# Patient Record
Sex: Male | Born: 1970
Health system: Southern US, Community
[De-identification: ages and names within clinical notes are randomized; demographics above are authoritative.]

## PROBLEM LIST (undated history)

## (undated) DIAGNOSIS — E785 Hyperlipidemia, unspecified: Secondary | ICD-10-CM

## (undated) DIAGNOSIS — K279 Peptic ulcer, site unspecified, unspecified as acute or chronic, without hemorrhage or perforation: Secondary | ICD-10-CM

## (undated) DIAGNOSIS — I10 Essential (primary) hypertension: Secondary | ICD-10-CM

## (undated) DIAGNOSIS — A630 Anogenital (venereal) warts: Secondary | ICD-10-CM

## (undated) HISTORY — DX: Anogenital (venereal) warts: A63.0

## (undated) HISTORY — DX: Essential (primary) hypertension: I10

## (undated) HISTORY — DX: Peptic ulcer, site unspecified, unspecified as acute or chronic, without hemorrhage or perforation: K27.9

## (undated) HISTORY — DX: Hyperlipidemia, unspecified: E78.5

---

## 2003-11-19 ENCOUNTER — Emergency Department (HOSPITAL_COMMUNITY): Admission: EM | Admit: 2003-11-19 | Discharge: 2003-11-19 | Payer: Self-pay | Admitting: Emergency Medicine

## 2003-11-22 ENCOUNTER — Emergency Department (HOSPITAL_COMMUNITY): Admission: EM | Admit: 2003-11-22 | Discharge: 2003-11-22 | Payer: Self-pay | Admitting: Emergency Medicine

## 2004-07-07 ENCOUNTER — Ambulatory Visit: Payer: Self-pay | Admitting: Internal Medicine

## 2004-07-15 ENCOUNTER — Encounter: Admission: RE | Admit: 2004-07-15 | Discharge: 2004-07-15 | Payer: Self-pay | Admitting: Internal Medicine

## 2004-12-25 ENCOUNTER — Emergency Department (HOSPITAL_COMMUNITY): Admission: EM | Admit: 2004-12-25 | Discharge: 2004-12-25 | Payer: Self-pay | Admitting: Emergency Medicine

## 2005-05-21 ENCOUNTER — Emergency Department (HOSPITAL_COMMUNITY): Admission: EM | Admit: 2005-05-21 | Discharge: 2005-05-21 | Payer: Self-pay | Admitting: Emergency Medicine

## 2005-11-22 ENCOUNTER — Ambulatory Visit: Payer: Self-pay | Admitting: Internal Medicine

## 2006-08-11 ENCOUNTER — Ambulatory Visit: Payer: Self-pay | Admitting: Internal Medicine

## 2007-08-15 ENCOUNTER — Ambulatory Visit: Payer: Self-pay | Admitting: Internal Medicine

## 2007-08-15 DIAGNOSIS — Z8711 Personal history of peptic ulcer disease: Secondary | ICD-10-CM

## 2007-08-17 LAB — CONVERTED CEMR LAB
AST: 76 units/L — ABNORMAL HIGH (ref 0–37)
Albumin: 4.1 g/dL (ref 3.5–5.2)
Alkaline Phosphatase: 46 units/L (ref 39–117)
BUN: 7 mg/dL (ref 6–23)
Basophils Absolute: 0.1 10*3/uL (ref 0.0–0.1)
Calcium: 9.6 mg/dL (ref 8.4–10.5)
Chloride: 109 meq/L (ref 96–112)
Cholesterol: 226 mg/dL (ref 0–200)
Creatinine, Ser: 1 mg/dL (ref 0.4–1.5)
MCHC: 32 g/dL (ref 30.0–36.0)
Monocytes Relative: 6.4 % (ref 3.0–11.0)
Potassium: 4.2 meq/L (ref 3.5–5.1)
RBC: 5.33 M/uL (ref 4.22–5.81)
RDW: 12.3 % (ref 11.5–14.6)
TSH: 2.33 microintl units/mL (ref 0.35–5.50)
Total Bilirubin: 1.1 mg/dL (ref 0.3–1.2)
Total CHOL/HDL Ratio: 3.3
Triglycerides: 65 mg/dL (ref 0–149)

## 2007-08-22 ENCOUNTER — Telehealth (INDEPENDENT_AMBULATORY_CARE_PROVIDER_SITE_OTHER): Payer: Self-pay | Admitting: *Deleted

## 2007-09-17 ENCOUNTER — Encounter: Payer: Self-pay | Admitting: Internal Medicine

## 2007-09-17 ENCOUNTER — Encounter: Admission: RE | Admit: 2007-09-17 | Discharge: 2007-09-17 | Payer: Self-pay | Admitting: Internal Medicine

## 2007-10-11 ENCOUNTER — Telehealth (INDEPENDENT_AMBULATORY_CARE_PROVIDER_SITE_OTHER): Payer: Self-pay | Admitting: *Deleted

## 2007-10-15 ENCOUNTER — Ambulatory Visit: Payer: Self-pay | Admitting: Internal Medicine

## 2007-10-23 ENCOUNTER — Telehealth (INDEPENDENT_AMBULATORY_CARE_PROVIDER_SITE_OTHER): Payer: Self-pay | Admitting: *Deleted

## 2008-04-03 ENCOUNTER — Telehealth: Payer: Self-pay | Admitting: Internal Medicine

## 2008-05-28 ENCOUNTER — Telehealth (INDEPENDENT_AMBULATORY_CARE_PROVIDER_SITE_OTHER): Payer: Self-pay | Admitting: *Deleted

## 2008-06-04 ENCOUNTER — Encounter (INDEPENDENT_AMBULATORY_CARE_PROVIDER_SITE_OTHER): Payer: Self-pay | Admitting: *Deleted

## 2008-06-06 HISTORY — PX: VASECTOMY: SHX75

## 2008-11-24 ENCOUNTER — Encounter: Payer: Self-pay | Admitting: Internal Medicine

## 2009-06-23 ENCOUNTER — Ambulatory Visit: Payer: Self-pay | Admitting: Internal Medicine

## 2009-06-23 DIAGNOSIS — R6882 Decreased libido: Secondary | ICD-10-CM | POA: Insufficient documentation

## 2009-07-01 LAB — CONVERTED CEMR LAB
Basophils Absolute: 0 10*3/uL (ref 0.0–0.1)
Direct LDL: 182.8 mg/dL
Eosinophils Relative: 3.7 % (ref 0.0–5.0)
GFR calc non Af Amer: 108 mL/min (ref >60)
Glucose, Bld: 93 mg/dL (ref 70–99)
HCT: 47.9 % (ref 39.0–52.0)
Hemoglobin: 15 g/dL (ref 13.0–17.0)
Lymphs Abs: 1.2 10*3/uL (ref 0.7–4.0)
Monocytes Relative: 6.4 % (ref 3.0–12.0)
Neutro Abs: 1.9 10*3/uL (ref 1.4–7.7)
Potassium: 4.5 meq/L (ref 3.5–5.1)
RDW: 13.5 % (ref 11.5–14.6)
Sodium: 139 meq/L (ref 135–145)
TSH: 2.31 microintl units/mL (ref 0.35–5.50)
Testosterone: 409.63 ng/dL (ref 350.00–890.00)
Total CHOL/HDL Ratio: 3
VLDL: 24.8 mg/dL (ref 0.0–40.0)

## 2010-07-08 NOTE — Assessment & Plan Note (Signed)
Summary: CPX//PH   Vital Signs:  Patient profile:   40 year old male Height:      72.25 inches Weight:      196.6 pounds BMI:     26.58 Pulse rate:   74 / minute BP sitting:   110 / 70  Vitals Entered By: Shary Decamp (June 23, 2009 9:36 AM) CC: cpx/fasting   History of Present Illness: CPX s/p vasectomy, since then sex drive is low and having early eyaculation   Habits & Providers  Alcohol-Tobacco-Diet     Alcohol type: beer - couple times a week     Tobacco Status: quit     Year Quit: 2000  Exercise-Depression-Behavior     Does Patient Exercise: yes     Times/week: 4     Drug Use: never  Current Medications (verified): 1)  None  Allergies (verified): No Known Drug Allergies  Past History:  Past Medical History: Reviewed history from 08/15/2007 and no changes required. h/o elevated cholesterol h/o PUD in the 1990s (had a EGD)  Past Surgical History: Vasectomy (01/2009)  Family History: CAD - no HTN - MGM DM - M. Uncle lung Ca - F, smoker  stroke - bro colon Ca - no prostate Ca - no M - living    Social History: Single. 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 CV:  Denies chest pain or discomfort and swelling of feet. Resp:  Denies cough and shortness of breath. GI:  Denies bloody stools, diarrhea, nausea, and vomiting. GU:  Denies dysuria and hematuria. Psych:  Denies anxiety and depression.  Physical Exam  General:  alert, well-developed, and well-nourished.   Neck:  no masses and no thyromegaly.   Lungs:  normal respiratory effort, no intercostal retractions, no accessory muscle use, and normal breath sounds.   Heart:   normal rate, regular rhythm, no murmur, and no gallop.   Abdomen:  soft, non-tender, no distention, no masses, no guarding, and no rigidity.   Genitalia:  circumcised, no hydrocele, no varicocele, no scrotal masses, no testicular masses or atrophy, no cutaneous lesions, and no urethral discharge.   Extremities:  no edema Psych:  Oriented X3, memory intact for recent and remote, normally interactive, good eye contact, not anxious appearing, and not depressed appearing.     Impression & Recommendations:  Problem # 1:  HEALTH SCREENING (ICD-V70.0)  Tetanus/Td:  Tdap (08/15/2007) Influenza Vaccine not given due to: declined, explain that benefits counseled: STE continue with his very healthy lifestyle  Orders: Venipuncture (62376) TLB-ALT (SGPT) (84460-ALT) TLB-AST (SGOT) (84450-SGOT) TLB-BMP (Basic Metabolic Panel-BMET) (80048-METABOL) TLB-CBC Platelet - w/Differential (85025-CBCD) TLB-Lipid Panel (80061-LIPID) TLB-TSH (Thyroid Stimulating Hormone) (84443-TSH)  Problem # 2:  DECREASED LIBIDO (ICD-799.81)  unlikely to be from a vasectomy Will check a testosterone level counseled about a healthy sex life   Orders: TLB-Testosterone, Total (84403-TESTO) T- * Misc. Laboratory test [phone])  Patient Instructions: 1)  Please schedule a follow-up appointment in 1 year.    Immunization History:  Tetanus/Td Immunization History:    Tetanus/Td:  Tdap (08/15/2007)  Not Administered:    Influenza Vaccine not given due to: declined

## 2010-07-08 NOTE — Letter (Signed)
Summary: Cancer Screening/[facility] Personalized Risk Profile  Cancer Screening/[facility] Personalized Risk Profile   Imported By: Lanelle Bal 07/01/2009 10:50:07  _____________________________________________________________________  External Attachment:    Type:   Image     Comment:   External Document

## 2012-03-29 ENCOUNTER — Ambulatory Visit (INDEPENDENT_AMBULATORY_CARE_PROVIDER_SITE_OTHER): Payer: BC Managed Care – PPO | Admitting: Internal Medicine

## 2012-03-29 ENCOUNTER — Encounter: Payer: Self-pay | Admitting: Internal Medicine

## 2012-03-29 VITALS — BP 112/70 | HR 64 | Temp 97.6°F | Ht 73.5 in | Wt 196.0 lb

## 2012-03-29 DIAGNOSIS — Z23 Encounter for immunization: Secondary | ICD-10-CM

## 2012-03-29 DIAGNOSIS — Z0000 Encounter for general adult medical examination without abnormal findings: Secondary | ICD-10-CM

## 2012-03-29 LAB — CBC WITH DIFFERENTIAL/PLATELET
Basophils Absolute: 0 10*3/uL (ref 0.0–0.1)
Eosinophils Relative: 2 % (ref 0.0–5.0)
Monocytes Absolute: 0.2 10*3/uL (ref 0.1–1.0)
Monocytes Relative: 7.3 % (ref 3.0–12.0)
Neutrophils Relative %: 42 % — ABNORMAL LOW (ref 43.0–77.0)
Platelets: 172 10*3/uL (ref 150.0–400.0)
WBC: 3.2 10*3/uL — ABNORMAL LOW (ref 4.5–10.5)

## 2012-03-29 MED ORDER — HYDROCORTISONE 2.5 % EX CREA: TOPICAL_CREAM | Freq: Two times a day (BID) | CUTANEOUS | Status: DC

## 2012-03-29 NOTE — Patient Instructions (Addendum)
Bring the  DOT form Use cream twice a day for 10 days If not better let me know, will refer you to dermatology

## 2012-03-29 NOTE — Progress Notes (Signed)
  Subjective:    Patient ID: Russell Ortiz, male    DOB: 05/07/1971, 41 y.o.   MRN: [id number]  HPI Complete physical exam  Needs a DOT form About a month ago, developed acutely a itchy rash on the arms, went to the urgent care, was prescribed by mouth and topical steroids. Was also prescribed Zyrtec which can take due to drowsiness. The area is still red and slightly itchy. The day the rash started he was exposed to a used car, used gloves  Past Medical History: h/o elevated cholesterol h/o PUD in the 1990s (had a EGD)  Past Surgical History: Vasectomy (01/2009)  Family History: CAD - no HTN - MGM DM - M, Uncle lung Ca - F, smoker  stroke - bro (age 30) colon Ca - no prostate Ca - no   Social History: Single. lives w/  GF 4 kids` Occupation:  truck driving  tobacco-- quit in the 90s ETOH-- socially  diet--  healthy,no red meat-pork, low fat, + salads exercise-- active, exercise frequently   Drug Use:  never    Review of Systems No chest pain or shortness of breath No nausea, vomiting, diarrhea Occasional fatigue but does not feel sleepy. He sleeps around 7-8 hours a night, wakes up refreshed and ready to go. Occasionally snores. No anxiety or depression No dysuria or gross hematuria     Objective:   Physical Exam  Skin:      General -- alert, well-developed, and well-nourished.   Neck --no thyromegaly , normal carotid pulse  Lungs -- normal respiratory effort, no intercostal retractions, no accessory muscle use, and normal breath sounds.   Heart-- normal rate, regular rhythm, no murmur, and no gallop.   Abdomen--soft, non-tender, no distention, no masses, no HSM, no guarding, and no rigidity. No hernias  Extremities-- no pretibial edema bilaterally Neurologic-- alert & oriented X3 , DTRs and strength normal in all extremities. Psych-- Cognition and judgment appear intact. Alert and cooperative with normal attention span and concentration.  not anxious  appearing and not depressed appearing.        Assessment & Plan:  Rash, Suspect contact dermatitis,  the rash started after he worked on a car while he was wearing gloves that's probably the reason he has no rash in the hands. Will prescribe hydrocortisone cream for 10 days, if not better dermatology referral.

## 2012-03-29 NOTE — Assessment & Plan Note (Addendum)
Td 09 Flu shot today Continue with a healthy lifestyle Labs Self testicular exam Will bring a DOT form ,eye exam will be done by optometry Occasionally feels fatigued but not sleepy ,reports he had a screening for sleep apnea ~ 2 years ago before he started his job and it was negative

## 2012-03-30 LAB — LDL CHOLESTEROL, DIRECT: Direct LDL: 231.1 mg/dL

## 2012-03-30 LAB — LIPID PANEL
HDL: 69.1 mg/dL (ref >39.00)
Total CHOL/HDL Ratio: 4
VLDL: 16.4 mg/dL (ref 0.0–40.0)

## 2012-03-30 LAB — TSH: TSH: 1.8 u[IU]/mL (ref 0.35–5.50)

## 2012-04-04 ENCOUNTER — Encounter: Payer: Self-pay | Admitting: *Deleted

## 2012-04-24 ENCOUNTER — Other Ambulatory Visit (INDEPENDENT_AMBULATORY_CARE_PROVIDER_SITE_OTHER): Payer: BC Managed Care – PPO

## 2012-04-24 DIAGNOSIS — Z0000 Encounter for general adult medical examination without abnormal findings: Secondary | ICD-10-CM

## 2012-04-25 LAB — COMPREHENSIVE METABOLIC PANEL
ALT: 37 U/L (ref 0–53)
AST: 72 U/L — ABNORMAL HIGH (ref 0–37)
CO2: 28 mEq/L (ref 19–32)
Chloride: 105 mEq/L (ref 96–112)
GFR: 114.28 mL/min (ref >60.00)
Sodium: 139 mEq/L (ref 135–145)
Total Bilirubin: 0.6 mg/dL (ref 0.3–1.2)
Total Protein: 7.6 g/dL (ref 6.0–8.3)

## 2012-04-27 ENCOUNTER — Other Ambulatory Visit: Payer: Self-pay | Admitting: *Deleted

## 2012-05-04 ENCOUNTER — Other Ambulatory Visit: Payer: Self-pay | Admitting: Internal Medicine

## 2012-05-04 MED ORDER — HYDROCORTISONE 2.5 % EX CREA: TOPICAL_CREAM | Freq: Two times a day (BID) | CUTANEOUS | Status: DC

## 2012-05-04 NOTE — Telephone Encounter (Signed)
Refill done.  

## 2012-05-04 NOTE — Telephone Encounter (Signed)
Refill x1, arrange a dermatology referral as well.

## 2012-05-04 NOTE — Telephone Encounter (Signed)
Pt is requesting a refill for hydrocortisone cream. OK to refill or does pt need a referral to dermatology?

## 2012-05-07 ENCOUNTER — Ambulatory Visit (INDEPENDENT_AMBULATORY_CARE_PROVIDER_SITE_OTHER): Payer: BC Managed Care – PPO | Admitting: Family

## 2012-05-07 ENCOUNTER — Telehealth: Payer: Self-pay | Admitting: Internal Medicine

## 2012-05-07 ENCOUNTER — Encounter: Payer: Self-pay | Admitting: Family

## 2012-05-07 VITALS — BP 120/80 | HR 93 | Temp 98.3°F | Wt 199.0 lb

## 2012-05-07 DIAGNOSIS — J069 Acute upper respiratory infection, unspecified: Secondary | ICD-10-CM

## 2012-05-07 DIAGNOSIS — H6120 Impacted cerumen, unspecified ear: Secondary | ICD-10-CM

## 2012-05-07 MED ORDER — METHYLPREDNISOLONE 4 MG PO KIT: PACK | ORAL | Status: DC

## 2012-05-07 NOTE — Telephone Encounter (Signed)
Rx sent to pharmacy   

## 2012-05-07 NOTE — Telephone Encounter (Signed)
Patient Information:  Caller Name: Jomo  Phone: [phone]  Patient: Russell Ortiz, Russell Ortiz  Gender: Male  DOB: 1970/06/29  Age: 41 Years  PCP: Willow Ora   Symptoms  Reason For Call & Symptoms: calling to get an appt; pt reports "my body is getting weak at night" and not able to sleep; congestion  Reviewed Health History In EMR: Yes  Reviewed Medications In EMR: Yes  Reviewed Allergies In EMR: Yes  Reviewed Surgeries / Procedures: Yes  Date of Onset of Symptoms: 05/04/2012  Guideline(s) Used:  Sinus Pain and Congestion  Disposition Per Guideline:   See Today or Tomorrow in Office  Reason For Disposition Reached:   Patient wants to be seen  Advice Given:  Reassurance:   Sinus congestion is a normal part of a cold.  Usually home treatment with nasal washes can prevent an actual bacterial sinus infection.  For a Stuffy Nose - Use Nasal Washes:  Introduction: Saline (salt water) nasal irrigation (nasal wash) is an effective and simple home remedy for treating stuffy nose and sinus congestion. The nose can be irrigated by pouring, spraying, or squirting salt water into the nose and then letting it run back out.  How it Helps: The salt water rinses out excess mucus, washes out any irritants (dust, allergens) that might be present, and moistens the nasal cavity.  Call Back If:   You become worse.  Office Follow Up:  Does the office need to follow up with this patient?: No  Instructions For The Office: N/A  Appointment Scheduled:  05/07/2012 10:30:00 NO APPT FOUND WITH DR PAZ,  APPT SCHEDULED AT [facility] WITH PA CAMPBELL PER INSTRUCTIONS ON PROFILE

## 2012-05-07 NOTE — Progress Notes (Signed)
  Subjective:    Patient ID: Russell Ortiz, male    DOB: Feb 03, 1971, 41 y.o.   MRN: [id number]  HPI 41 year old African American male, nonsmoker, patient of Dr. Drue Novel in today with complaints of headache, congestion, and weakness x3 days. He has not taken any medication over-the-counter for relief. Denies any environmental changes.   Review of Systems  Constitutional: Negative.   HENT: Positive for congestion, sneezing and postnasal drip.   Eyes: Negative.   Respiratory: Negative.   Cardiovascular: Negative.   Gastrointestinal: Negative.   Skin: Negative.   Hematological: Negative.   Psychiatric/Behavioral: Negative.    No past medical history on file.  History   Social History  . Marital Status: Single    Spouse Name: N/A    Number of Children: N/A  . Years of Education: N/A   Occupational History  . Not on file.   Social History Main Topics  . Smoking status: Never Smoker   . Smokeless tobacco: Not on file  . Alcohol Use: Not on file  . Drug Use: Not on file  . Sexually Active: Not on file   Other Topics Concern  . Not on file   Social History Narrative  . No narrative on file    No past surgical history on file.  No family history on file.  No Known Allergies  Current Outpatient Prescriptions on File Prior to Visit  Medication Sig Dispense Refill  . hydrocortisone 2.5 % cream Apply topically 2 (two) times daily.  60 g  0    BP 120/80  Pulse 93  Temp 98.3 F (36.8 C) (Oral)  Wt 199 lb (90.266 kg)  SpO2 98%chart    Objective:   Physical Exam  Constitutional: He is oriented to person, place, and time. He appears well-developed and well-nourished.  HENT:  Nose: Nose normal.  Mouth/Throat: Oropharynx is clear and moist.       Ears bilaterally impacted with cerumen  Neck: Normal range of motion. Neck supple.  Cardiovascular: Normal rate, regular rhythm and normal heart sounds.   Pulmonary/Chest: Effort normal and breath sounds normal.  Neurological:  He is alert and oriented to person, place, and time.  Skin: Skin is warm and dry.  Psychiatric: He has a normal mood and affect.     Informed consent was obtained and peroxide gel was inserted into the ears bilaterally using the lavage kit the ears were lavaged until clean.Inspection with a cerumen spoon removed residual wax. Patient tolerated the procedure well.      Assessment & Plan:  Assessment: Upper respiratory infection, cerumen impaction  Plan: Over-the-counter symptomatic treatment for relief. Medrol Dosepak as directed. Patient call the office if symptoms worsen or persist. Recheck as scheduled, and when necessary.

## 2012-05-07 NOTE — Telephone Encounter (Signed)
Pt called and was in to see Russell Ortiz today re: sinus inf. Pt was told that med was going to be called in to [facility] in [place], [place] but hasn't been done yet. Pls call in today.

## 2012-05-07 NOTE — Telephone Encounter (Signed)
Noted  

## 2012-05-07 NOTE — Patient Instructions (Addendum)

## 2012-05-08 ENCOUNTER — Telehealth: Payer: Self-pay | Admitting: Internal Medicine

## 2012-05-08 NOTE — Telephone Encounter (Signed)
Advise patient: Needs labs: LFTs and hepatitis serologies (orders already entered) dx elevated LFTs

## 2012-05-09 ENCOUNTER — Other Ambulatory Visit: Payer: BC Managed Care – PPO

## 2012-05-09 ENCOUNTER — Other Ambulatory Visit (INDEPENDENT_AMBULATORY_CARE_PROVIDER_SITE_OTHER): Payer: BC Managed Care – PPO | Admitting: *Deleted

## 2012-05-09 DIAGNOSIS — Z0000 Encounter for general adult medical examination without abnormal findings: Secondary | ICD-10-CM

## 2012-05-09 LAB — POCT URINALYSIS DIPSTICK
Bilirubin, UA: NEGATIVE
Glucose, UA: NEGATIVE
Ketones, UA: NEGATIVE
Leukocytes, UA: NEGATIVE
Nitrite, UA: NEGATIVE
pH, UA: 6.5

## 2012-05-09 NOTE — Telephone Encounter (Signed)
lmovm for pt to return call.  

## 2012-05-09 NOTE — Telephone Encounter (Signed)
Discussed with pt. Coming in today for lab appt.

## 2012-11-01 ENCOUNTER — Encounter: Payer: Self-pay | Admitting: *Deleted

## 2013-04-11 ENCOUNTER — Other Ambulatory Visit: Payer: Self-pay

## 2015-03-12 ENCOUNTER — Ambulatory Visit (INDEPENDENT_AMBULATORY_CARE_PROVIDER_SITE_OTHER): Payer: 59 | Admitting: Family Medicine

## 2015-03-12 ENCOUNTER — Encounter: Payer: Self-pay | Admitting: Family Medicine

## 2015-03-12 VITALS — BP 127/81 | HR 57 | Temp 98.0°F | Resp 18 | Ht 73.0 in | Wt 195.0 lb

## 2015-03-12 DIAGNOSIS — R7989 Other specified abnormal findings of blood chemistry: Secondary | ICD-10-CM

## 2015-03-12 DIAGNOSIS — Z125 Encounter for screening for malignant neoplasm of prostate: Secondary | ICD-10-CM | POA: Diagnosis not present

## 2015-03-12 DIAGNOSIS — Z114 Encounter for screening for human immunodeficiency virus [HIV]: Secondary | ICD-10-CM | POA: Diagnosis not present

## 2015-03-12 DIAGNOSIS — R945 Abnormal results of liver function studies: Principal | ICD-10-CM

## 2015-03-12 DIAGNOSIS — E785 Hyperlipidemia, unspecified: Secondary | ICD-10-CM | POA: Diagnosis not present

## 2015-03-12 DIAGNOSIS — Z0000 Encounter for general adult medical examination without abnormal findings: Secondary | ICD-10-CM

## 2015-03-12 LAB — COMPREHENSIVE METABOLIC PANEL
ALT: 37 U/L (ref 0–53)
AST: 45 U/L — ABNORMAL HIGH (ref 0–37)
Albumin: 4.6 g/dL (ref 3.5–5.2)
Alkaline Phosphatase: 57 U/L (ref 39–117)
BUN: 12 mg/dL (ref 6–23)
CHLORIDE: 105 meq/L (ref 96–112)
CO2: 29 mEq/L (ref 19–32)
Calcium: 10 mg/dL (ref 8.4–10.5)
Creatinine, Ser: 1.04 mg/dL (ref 0.40–1.50)
GFR: 100.27 mL/min (ref >60.00)
GLUCOSE: 89 mg/dL (ref 70–99)
POTASSIUM: 4.6 meq/L (ref 3.5–5.1)
SODIUM: 141 meq/L (ref 135–145)
Total Bilirubin: 0.7 mg/dL (ref 0.2–1.2)
Total Protein: 7.6 g/dL (ref 6.0–8.3)

## 2015-03-12 LAB — CBC WITH DIFFERENTIAL/PLATELET
BASOS PCT: 0.3 % (ref 0.0–3.0)
Basophils Absolute: 0 10*3/uL (ref 0.0–0.1)
EOS PCT: 4 % (ref 0.0–5.0)
Eosinophils Absolute: 0.2 10*3/uL (ref 0.0–0.7)
HCT: 49.7 % (ref 39.0–52.0)
Hemoglobin: 16 g/dL (ref 13.0–17.0)
LYMPHS ABS: 1.6 10*3/uL (ref 0.7–4.0)
Lymphocytes Relative: 42.2 % (ref 12.0–46.0)
MCHC: 32.2 g/dL (ref 30.0–36.0)
MCV: 84.6 fl (ref 78.0–100.0)
MONO ABS: 0.3 10*3/uL (ref 0.1–1.0)
Monocytes Relative: 9 % (ref 3.0–12.0)
NEUTROS ABS: 1.7 10*3/uL (ref 1.4–7.7)
NEUTROS PCT: 44.5 % (ref 43.0–77.0)
Platelets: 169 10*3/uL (ref 150.0–400.0)
RBC: 5.87 Mil/uL — ABNORMAL HIGH (ref 4.22–5.81)
RDW: 13.9 % (ref 11.5–15.5)
WBC: 3.8 10*3/uL — ABNORMAL LOW (ref 4.0–10.5)

## 2015-03-12 LAB — LIPID PANEL
CHOLESTEROL: 246 mg/dL — AB (ref 0–200)
HDL: 60.6 mg/dL (ref >39.00)
LDL CALC: 163 mg/dL — AB (ref 0–99)
NonHDL: 184.94
Total CHOL/HDL Ratio: 4
Triglycerides: 108 mg/dL (ref 0.0–149.0)
VLDL: 21.6 mg/dL (ref 0.0–40.0)

## 2015-03-12 LAB — PSA: PSA: 0.24 ng/mL (ref 0.10–4.00)

## 2015-03-12 NOTE — Patient Instructions (Signed)

## 2015-03-12 NOTE — Progress Notes (Signed)
Subjective:    Patient ID: Russell Ortiz, male    DOB: [birth year], 44 y.o.   MRN: [id number]  HPI  Patient presents for new patient transfer and need of CPE. All past medical history, surgical history, allergies, family history, immunizations and social history was obtained from the patient today and entered/updated into the electronic medical record. He has no complaints today.    Health maintenance:  Colonoscopy: No family history, routine screening indicated at 42-50 44 African American male. PSA: Indicated, African-American male > 40. Immunizations: Tetanus vaccination up-to-date. Flu vaccination indicated. Infectious disease screening: HIV screening indicated.  Past Medical History  Diagnosis Date  . Hyperlipidemia   . Genital warts   . PUD (peptic ulcer disease)    No Known Allergies Past Surgical History  Procedure Laterality Date  . Vasectomy  2010   Family History  Problem Relation Age of Onset  . Cancer Father   . Lung cancer Father   . Asthma Brother   . Diabetes Maternal Grandfather   . Diabetes Maternal Uncle    Social History   Social History  . Marital Status: Single    Spouse Name: N/A  . Number of Children: N/A  . Years of Education: N/A   Occupational History  . Not on file.   Social History Main Topics  . Smoking status: Former Smoker    Quit date: 06/06/1997  . Smokeless tobacco: Never Used  . Alcohol Use: Yes     Comment: socially  . Drug Use: No  . Sexual Activity: Yes   Other Topics Concern  . Not on file   Social History Narrative   Married. 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    Wears seat belt. Exercises 3x or greater a week.    Former Smoker. Quit 1999, Occasional ETOH, no recreational drugs.    Drinks caffeine beverages. Uses herbal remedies. Takes a multivitamin.    Smoker alarm in the home.    Feels safe in his relationships.        Review of Systems Negative, with the  exception of above mentioned in HPI     Objective:   Physical Exam BP 127/81 mmHg  Pulse 57  Temp(Src) 98 F (36.7 C) (Temporal)  Resp 18  Ht 6\' 1"  (1.854 m)  Wt 195 lb (88.451 kg)  BMI 25.73 kg/m2  SpO2 99% Gen: Afebrile. No acute distress. Nontoxic in appearance, well-developed, well-nourished, African-American male. Pleasant. HENT: AT. [place]. Bilateral TM visualized and normal in appearance. MMM. Bilateral nares without erythema or swelling, nasal septum midline. Throat without erythema or exudates.  Eyes:Pupils Equal Round Reactive to light, Extraocular movements intact,  Conjunctiva without redness, discharge or icterus. Neck/lymp/endocrine: Supple, no lymphadenopathy, no thyromegaly CV: RRR no murmurs appreciated, no edema, +2/4 P posterior tibialis pulses Chest: CTAB, no wheeze or crackles Abd: Soft. Flat. NTND. BS present. No Masses palpated. Skin: No rashes, purpura or petechiae.  Neuro/MSK: Normal gait. PERLA. EOMi. Alert. Oriented x3. Cranial nerves II through XII intact. No focal deficits identified. Muscle strength 5/5 upper and lower extremity. DTRs equal bilaterally. Psych: Normal affect, dress and demeanor. Normal speech. Normal thought content and judgment.    Assessment & Plan:  1. Elevated liver function tests - prior h/o of elevated LFT, no recent labs available.  - CBC w/Diff - Comprehensive metabolic panel  2. Hyperlipemia - Diet low in saturated/trans-fats. High fiber. Plenty of fresh fruits and vegetables. Exercise at  least 150 minutes a week. - Lipid panel - If indicated patient will be started on cholesterol-lowering medication versus fish oil.  3. Prostate cancer screening - African-American male, denies urinary symptoms, possible early ejaculation symptoms. No family history. - PSA. Will obtain baseline PSA today, then screening per patient discussion or development of symptoms.  4. Encounter for screening for HIV - HIV antibody (with reflex)  5.  Annual physical exam - CBC, CMP, lipids, PSA, HIV screening completed today. - Patient declined flu shot today.

## 2015-03-12 NOTE — Progress Notes (Signed)
Pre visit review using our clinic review tool, if applicable. No additional management support is needed unless otherwise documented below in the visit note. 

## 2015-03-13 LAB — HIV ANTIBODY (ROUTINE TESTING W REFLEX): HIV 1&2 Ab, 4th Generation: NONREACTIVE

## 2015-03-17 ENCOUNTER — Telehealth: Payer: Self-pay | Admitting: Family Medicine

## 2015-03-17 NOTE — Telephone Encounter (Signed)
VM box not set up.  Will have to try to call later.

## 2015-03-17 NOTE — Telephone Encounter (Signed)
Please call pt: all of labs are normal and look good, with the exception of his cholesterol.  - His Total cholesterol is elevated as well as  His "bad"cholesterol.  - I would recommend he take OTC fish oil supplement. Increase exercise to at least 3 times a week and eat a diet low in saturated/trans fats. Increase fresh fruit/veggies and fiber.  - I will want to recheck in 6 months after these modifications have been made, if we're unable to decrease the "bad cholesterol" then we may need to prescribe a medication.

## 2015-03-18 NOTE — Telephone Encounter (Signed)
Patient aware of results and diet / exercise changes.  Pt had no further questions at this time.

## 2015-08-17 ENCOUNTER — Encounter: Payer: Self-pay | Admitting: Family Medicine

## 2015-08-17 ENCOUNTER — Telehealth: Payer: Self-pay | Admitting: Family Medicine

## 2015-08-17 ENCOUNTER — Ambulatory Visit (INDEPENDENT_AMBULATORY_CARE_PROVIDER_SITE_OTHER): Payer: PRIVATE HEALTH INSURANCE | Admitting: Family Medicine

## 2015-08-17 DIAGNOSIS — E785 Hyperlipidemia, unspecified: Secondary | ICD-10-CM

## 2015-08-17 LAB — LIPID PANEL
CHOL/HDL RATIO: 4
CHOLESTEROL: 258 mg/dL — AB (ref 0–200)
HDL: 71.1 mg/dL (ref >39.00)
LDL Cholesterol: 169 mg/dL — ABNORMAL HIGH (ref 0–99)
NonHDL: 187.01
TRIGLYCERIDES: 92 mg/dL (ref 0.0–149.0)
VLDL: 18.4 mg/dL (ref 0.0–40.0)

## 2015-08-17 NOTE — Telephone Encounter (Signed)
Please call pt: - his cholesterol panel results are about the same as prior. However his good cholesterols is even higher. Since his good cholesterol is still high, it is not as concerning that his total cholesterol is higher than 200. However his LDL/bad cholesterol is still elevated as well and is borderline when we would start prescribed medication. - Patient has made dietary modifications, and is routinely taking fish oil supplementation now. Please  greater than 150 minutes of exercise weekly.  - We'll recheck cholesterol panel at wellness visit in 6 months, if LDL still remains high and patient amendable with start low-dose statin at that time, secondary to family history.

## 2015-08-17 NOTE — Patient Instructions (Signed)

## 2015-08-17 NOTE — Progress Notes (Signed)
   Subjective:    Patient ID: Russell Ortiz, male    DOB: 07-02-1970, 45 y.o.   MRN: [id number]  HPI  Hyperlipidemia: Pt is going to start working out at [employer] this week. Using fish oil supplements now. Has been making dietary modification over the last 6 months.   Past Medical History  Diagnosis Date  . Hyperlipidemia   . Genital warts   . PUD (peptic ulcer disease)    No Known Allergies Past Surgical History  Procedure Laterality Date  . Vasectomy  2010   Family History  Problem Relation Age of Onset  . Cancer Father   . Lung cancer Father   . Asthma Brother   . Diabetes Maternal Grandfather   . Diabetes Maternal Uncle    Social History   Social History  . Marital Status: Single    Spouse Name: N/A  . Number of Children: N/A  . Years of Education: N/A   Occupational History  . Not on file.   Social History Main Topics  . Smoking status: Former Smoker    Quit date: 06/06/1997  . Smokeless tobacco: Never Used  . Alcohol Use: Yes     Comment: socially  . Drug Use: No  . Sexual Activity: Yes   Other Topics Concern  . Not on file   Social History Narrative   Married. 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    Wears seat belt. Exercises 3x or greater a week.    Former Smoker. Quit 1999, Occasional ETOH, no recreational drugs.    Drinks caffeine beverages. Uses herbal remedies. Takes a multivitamin.    Smoker alarm in the home.    Feels safe in his relationships.        Review of Systems Negative, with the exception of above mentioned in HPI     Objective:   Physical Exam BP 120/74 mmHg  Pulse 64  Temp(Src) 98.3 F (36.8 C)  Resp 20  Ht 6\' 1"  (1.854 m)  Wt 200 lb 8 oz (90.946 kg)  BMI 26.46 kg/m2  SpO2 97% Gen: Afebrile. No acute distress. Nontoxic in appearance, well-developed, well-nourished, African-American male. Pleasant. HENT: AT. [place].MMM CV: RRR no murmurs appreciated, no edema, +2/4 P posterior  tibialis pulses Chest: CTAB, no wheeze or crackles     Assessment & Plan:  Hyperlipidemia - Diet low in saturated/trans-fats. High fiber. Plenty of fresh fruits and vegetables. Exercise at least 150 minutes a week. - Lipid panel repeat today - discussed treatment with statin if indicated - continue fish oil supplement.

## 2015-08-20 ENCOUNTER — Encounter: Payer: Self-pay | Admitting: *Deleted

## 2015-08-20 NOTE — Telephone Encounter (Signed)
Please send in letter format and mail then. Thanks.

## 2015-08-20 NOTE — Telephone Encounter (Signed)
Called patient several times to review labs patient voice mail not set up unable to leave a message.

## 2015-08-20 NOTE — Telephone Encounter (Signed)
Sent letter in My Chart format.

## 2016-03-11 ENCOUNTER — Encounter: Payer: Self-pay | Admitting: Family Medicine

## 2016-03-17 ENCOUNTER — Encounter: Payer: Self-pay | Admitting: Family Medicine

## 2016-03-17 ENCOUNTER — Ambulatory Visit (INDEPENDENT_AMBULATORY_CARE_PROVIDER_SITE_OTHER): Payer: 59 | Admitting: Family Medicine

## 2016-03-17 VITALS — BP 139/85 | HR 62 | Temp 98.1°F | Resp 20 | Ht 73.0 in | Wt 222.2 lb

## 2016-03-17 DIAGNOSIS — E785 Hyperlipidemia, unspecified: Secondary | ICD-10-CM

## 2016-03-17 DIAGNOSIS — B079 Viral wart, unspecified: Secondary | ICD-10-CM | POA: Diagnosis not present

## 2016-03-17 DIAGNOSIS — Z6829 Body mass index (BMI) 29.0-29.9, adult: Secondary | ICD-10-CM | POA: Diagnosis not present

## 2016-03-17 DIAGNOSIS — Z23 Encounter for immunization: Secondary | ICD-10-CM

## 2016-03-17 DIAGNOSIS — Z130 Encounter for screening for diseases of the blood and blood-forming organs and certain disorders involving the immune mechanism: Secondary | ICD-10-CM

## 2016-03-17 DIAGNOSIS — Z0000 Encounter for general adult medical examination without abnormal findings: Secondary | ICD-10-CM

## 2016-03-17 DIAGNOSIS — Z125 Encounter for screening for malignant neoplasm of prostate: Secondary | ICD-10-CM

## 2016-03-17 LAB — CBC WITH DIFFERENTIAL/PLATELET
BASOS ABS: 0.1 10*3/uL (ref 0.0–0.1)
Basophils Relative: 1.7 % (ref 0.0–3.0)
EOS ABS: 0.3 10*3/uL (ref 0.0–0.7)
Eosinophils Relative: 7.2 % — ABNORMAL HIGH (ref 0.0–5.0)
HEMATOCRIT: 47.1 % (ref 39.0–52.0)
HEMOGLOBIN: 15.7 g/dL (ref 13.0–17.0)
LYMPHS PCT: 48.2 % — AB (ref 12.0–46.0)
Lymphs Abs: 1.9 10*3/uL (ref 0.7–4.0)
MCHC: 33.2 g/dL (ref 30.0–36.0)
MCV: 84.1 fl (ref 78.0–100.0)
MONOS PCT: 7.8 % (ref 3.0–12.0)
Monocytes Absolute: 0.3 10*3/uL (ref 0.1–1.0)
Neutro Abs: 1.4 10*3/uL (ref 1.4–7.7)
Neutrophils Relative %: 35.1 % — ABNORMAL LOW (ref 43.0–77.0)
Platelets: 195 10*3/uL (ref 150.0–400.0)
RBC: 5.6 Mil/uL (ref 4.22–5.81)
RDW: 14.2 % (ref 11.5–15.5)
WBC: 4 10*3/uL (ref 4.0–10.5)

## 2016-03-17 LAB — BASIC METABOLIC PANEL
BUN: 9 mg/dL (ref 6–23)
CHLORIDE: 104 meq/L (ref 96–112)
CO2: 30 meq/L (ref 19–32)
CREATININE: 1.04 mg/dL (ref 0.40–1.50)
Calcium: 9.8 mg/dL (ref 8.4–10.5)
GFR: 99.8 mL/min (ref >60.00)
GLUCOSE: 92 mg/dL (ref 70–99)
POTASSIUM: 4.3 meq/L (ref 3.5–5.1)
Sodium: 139 mEq/L (ref 135–145)

## 2016-03-17 LAB — PSA: PSA: 0.21 ng/mL (ref 0.10–4.00)

## 2016-03-17 LAB — LIPID PANEL
CHOL/HDL RATIO: 6
CHOLESTEROL: 307 mg/dL — AB (ref 0–200)
HDL: 54.1 mg/dL (ref >39.00)
LDL CALC: 229 mg/dL — AB (ref 0–99)
NONHDL: 252.48
Triglycerides: 115 mg/dL (ref 0.0–149.0)
VLDL: 23 mg/dL (ref 0.0–40.0)

## 2016-03-17 LAB — HEMOGLOBIN A1C: HEMOGLOBIN A1C: 5.9 % (ref 4.6–6.5)

## 2016-03-17 NOTE — Patient Instructions (Signed)

## 2016-03-17 NOTE — Progress Notes (Signed)
Patient ID: Russell Ortiz, male  DOB: [birth year], 45 y.o.   MRN: [id number] Patient Care Team    Relationship Specialty Notifications Start End  Ma Hillock, DO PCP - General Family Medicine  08/17/15     Subjective:  Russell Ortiz is a 45 y.o. male present for CPE. All past medical history, surgical history, allergies, family history, immunizations, medications and social history were updated in the electronic medical record today. All recent labs, ED visits and hospitalizations within the last year were reviewed.  Hyperlipidemia: pt has had elevated cholesterol for a few years. He has finally started fish oil supplement, he thinks about 1500 mg. He also eats tuna daily. He has gained weight since last visit. He is not exercising yet, still plans to join a gym.   Skin: he has two skin complaints today. He has mild tenderness over his left chest area, that is tender to touch. He also has a warty lesion he has been attempting to use OTC treatment without success for a few months.   Health maintenance:  Colonoscopy: No family history, routine screening indicated at 41-50 53 African American male. Immunizations:  tdap 08/2007, influenza 03/17/2016(encouraged yearly) Infectious disease screening: HIV completed 03/2015 PSA: no fhx Lab Results  Component Value Date   PSA 0.24 03/12/2015  , pt was counseled on prostate cancer screenings.  Assistive device: none Oxygen use none Patient has a Dental home. Hospitalizations/ED visits: None  Depression screen [facility] 2/9 03/17/2016  Decreased Interest 0  Down, Depressed, Hopeless 0  PHQ - 2 Score 0    Immunization History  Administered Date(s) Administered  . Influenza Split 03/29/2012  . Influenza,inj,Quad PF,36+ Mos 03/17/2016  . Td 08/15/2007    Past Medical History:  Diagnosis Date  . Genital warts   . Hyperlipidemia   . PUD (peptic ulcer disease)    No Known Allergies Past Surgical History:  Procedure Laterality Date  .  VASECTOMY  2010   Family History  Problem Relation Age of Onset  . Lung cancer Father   . Asthma Brother   . Diabetes Maternal Grandfather   . Diabetes Maternal Uncle   . Hyperlipidemia Maternal Grandmother    Social History   Social History  . Marital status: Married    Spouse name: N/A  . Number of children: 1  . Years of education: 68   Occupational History  . truck driver    Social History Main Topics  . Smoking status: Former Smoker    Quit date: 06/06/1997  . Smokeless tobacco: Never Used  . Alcohol use Yes     Comment: socially  . Drug use: No  . Sexual activity: Yes    Partners: Female     Comment: married   Other Topics Concern  . Not on file   Social History Narrative   Married. 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    Wears seat belt. Exercises 3x or greater a week.    Former Smoker. Quit 1999, Occasional ETOH, no recreational drugs.    Drinks caffeine beverages. Uses herbal remedies. Takes a multivitamin.    Smoker alarm in the home.    Feels safe in his relationships.         Medication List       Accurate as of 03/17/16  9:38 AM. Always use your most recent med list.          MULTIVITAMIN ADULT  Chew Chew by mouth.        No results found for this or any previous visit (from the past 2160 hour(s)).  No results found.   ROS: 14 pt review of systems performed and negative (unless mentioned in an HPI)  Objective: BP 139/85 (BP Location: Right Arm, Patient Position: Sitting, Cuff Size: Large)   Pulse 62   Temp 98.1 F (36.7 C)   Resp 20   Ht 6\' 1"  (1.854 m)   Wt 222 lb 4 oz (100.8 kg)   SpO2 97%   BMI 29.32 kg/m  Gen: Afebrile. No acute distress. Nontoxic in appearance, well-developed, well-nourished,  Very pleasant [name].  HENT: AT. [place]. Bilateral TM visualized and normal in appearance, normal external auditory canal. MMM, no oral lesions, adequate dentition. Bilateral nares within normal  limits. Throat without erythema, ulcerations or exudates. no Cough on exam, no hoarseness on exam. Eyes:Pupils Equal Round Reactive to light, Extraocular movements intact,  Conjunctiva without redness, discharge or icterus. Neck/lymp/endocrine: Supple,no lymphadenopathy, no thyromegaly CV: RRR  No murmur, no edema, +2/4 P posterior tibialis pulses. no carotid bruits. No JVD. Chest: CTAB, no wheeze, rhonchi or crackles. normal Respiratory effort. no Air movement. Abd: Soft. flat. NTND. BS present. no Masses palpated. No hepatosplenomegaly. No rebound tenderness or guarding. Skin: no rashes, purpura or petechiae. Warm and well-perfused. Skin intact. Small comedone left areola mild TTP. Warty lesion left 3 rd finger.  Neuro/Msk:  Normal gait. PERLA. EOMi. Alert. Oriented x3.  Cranial nerves II through XII intact. Muscle strength 5/5 upper/lower extremity. DTRs equal bilaterally. Psych: Normal affect, dress and demeanor. Normal speech. Normal thought content and judgment.   Assessment/plan: Russell Ortiz is a 45 y.o. male present for CPE  Encounter for preventative adult health care examination Patient was encouraged to exercise greater than 150 minutes a week. Patient was encouraged to choose a diet filled with fresh fruits and vegetables, and lean meats. AVS provided to patient today for education/recommendation on gender specific health and safety maintenance. Colonoscopy: No family history, routine screening indicated at 65-50 79 African American male. Immunizations:  tdap 08/2007, influenza 03/17/2016(encouraged yearly) Infectious disease screening: HIV completed 03/2015 PSA screen normal, repeat today.  Pt has a dental home.  - CBC w/Diff - Basic Metabolic Panel (BMET) Hyperlipidemia, unspecified hyperlipidemia type - Lipid panel Need for prophylactic vaccination and inoculation against influenza - Flu Vaccine QUAD 36+ mos PF IM (Fluarix & Fluzone Quad PF) Prostate cancer screening -  PSA BMI 29.0-29.9,adult - diet an d exercise counseling provided . - Lipid panel - HgB A1c Screening, anemia, deficiency, iron - CBC w/Diff Viral warts, unspecified type - verbal consent obtained.  - cryotherapy destruction wart of 3rd finger left hand. Pt tolerated procedure well. After care instructions provided.   Return in about 1 year (around 03/17/2017) for CPE.  Electronically signed by: Howard Pouch, DO [place]

## 2016-03-18 ENCOUNTER — Telehealth: Payer: Self-pay | Admitting: Family Medicine

## 2016-03-18 DIAGNOSIS — E785 Hyperlipidemia, unspecified: Secondary | ICD-10-CM

## 2016-03-18 DIAGNOSIS — Z79899 Other long term (current) drug therapy: Secondary | ICD-10-CM

## 2016-03-18 DIAGNOSIS — D729 Disorder of white blood cells, unspecified: Secondary | ICD-10-CM

## 2016-03-18 MED ORDER — ATORVASTATIN CALCIUM 40 MG PO TABS
40.0000 mg | ORAL_TABLET | Freq: Every day | ORAL | 3 refills | Status: DC
Start: 2016-03-18 — End: 2016-04-06

## 2016-03-18 NOTE — Telephone Encounter (Signed)
Please call pt: - his labs returned and are all normal with the exception of his cholesterol and part of his white blood cells. - His cholesterol is severely high and his at a high cardiac risk with this level. I have called in a statin (cholesterol lowering medication). He is to start immediatly and follow up in 3 months for repeat lipids and LFT (fasting).  - his neutrophils are mildly low, this can happen from many reasons. First step would be simply repeat lab to see if self resolves. Repeat cbc in 4 weeks (lab appt only ok- if abnormal again then we would need an appt to discuss)   Lipid Panel     Component Value Date/Time   CHOL 307 (H) 03/17/2016 0929   TRIG 115.0 03/17/2016 0929   HDL 54.10 03/17/2016 0929   CHOLHDL 6 03/17/2016 0929   VLDL 23.0 03/17/2016 0929   LDLCALC 229 (H) 03/17/2016 0929   LDLDIRECT 231.1 03/29/2012 [id number]

## 2016-03-18 NOTE — Telephone Encounter (Signed)
Left message for patient to return call.

## 2016-03-21 NOTE — Telephone Encounter (Signed)
Spoke with patient reviewed lab results and instructions. Patient will call back to schedule lab appt. Patient verbalized understanding of all instructions.

## 2016-04-05 ENCOUNTER — Telehealth: Payer: Self-pay | Admitting: *Deleted

## 2016-04-05 NOTE — Telephone Encounter (Signed)
Patient states since starting the Lipitor his feels really jittery and is c/o chest pain after running last night and having random episodes of chest pain today. Patient offered an appt for today but states he is in [place] today. "Advised patient if having active chest pain to go to closest ER. Advised patient to discontinue Lipitor and if no active chest pain be seen in our office. Scheduled patient for visit tomorrow. Dr Raoul Pitch aware.

## 2016-04-06 ENCOUNTER — Encounter: Payer: Self-pay | Admitting: Family Medicine

## 2016-04-06 ENCOUNTER — Ambulatory Visit (INDEPENDENT_AMBULATORY_CARE_PROVIDER_SITE_OTHER): Payer: 59 | Admitting: Family Medicine

## 2016-04-06 VITALS — BP 123/79 | HR 73 | Temp 98.4°F | Resp 20 | Wt 222.2 lb

## 2016-04-06 DIAGNOSIS — R079 Chest pain, unspecified: Secondary | ICD-10-CM

## 2016-04-06 MED ORDER — SIMVASTATIN 20 MG PO TABS: 20.0000 mg | ORAL_TABLET | Freq: Every day | ORAL | 11 refills | Status: DC

## 2016-04-06 NOTE — Patient Instructions (Signed)
Coronary Artery Disease, Male Coronary artery disease (CAD) is a process in which the blood vessels of the heart (coronary arteries) become narrow or blocked. The narrowing or blockage can lead to decreased blood flow to the heart muscle (angina). Prolonged reduced blood flow can cause a heart attack (myocardial infarction, MI). Because CAD is the leading cause of death in men, it is important to understand what causes this condition and how it is treated. CAUSES Atherosclerosis is the cause of CAD. This is the buildup of fat and cholesterol (plaque) on the inside of the arteries. Over time, the plaque may narrow or block the artery, and this will lessen blood flow to the heart. Plaque can also become weak and break off within a coronary artery to form a clot and cause a sudden blockage. RISK FACTORS Many risk factors increase your chances of getting CAD, including:  High cholesterol levels.  High blood pressure (hypertension).  Tobacco use.  Diabetes.  Age. Men over age 33 are at a greater risk of CAD.  Gender. Men often develop CAD earlier in life than women.  Family history of CAD.  Obesity.  Lack of exercise.  A diet high in saturated fats. SYMPTOMS  Many people do not experience any symptoms during the early stages of CAD. As the condition progresses, symptoms may include:   Chest pain.  The pain can be described as a crushing or squeezing in the chest, or a tightness, pressure, fullness, or heaviness in the chest.  The pain can last more than a few minutes or can stop and recur.  Pain in the arms, neck, jaw, or back.  Unexplained heartburn or indigestion.  Shortness of breath.  Nausea.  Sudden cold sweats. Less common symptoms of CAD in men can include:  Fatigue.  Unexplained feelings of nervousness or anxiety.  Weakness.  Diarrhea.  Sudden light-headedness. DIAGNOSIS  Tests to diagnose CAD may include:  ECG (electrocardiogram).  Exercise stress  test. This looks for signs of blockage when the heart is being exercised.  Pharmacologic stress test. This test looks for signs of blockage when the heart is being stressed with a medicine.  Blood tests.  Coronary angiogram. This is a procedure to look at the coronary arteries to see if there is any blockage. TREATMENT The treatment of CAD may include the following:  Healthy behavioral changes to reduce or control risk factors.  Medicine.  Coronary stenting.A stent helps to keep an artery open.  Coronary angioplasty. This procedure widens a narrowed or blocked artery.  Coronary arterybypass surgery. This will allow your blood to pass the blockage (bypass) to reach your heart. HOME CARE INSTRUCTIONS  Take medicines only as directed by your health care provider.  Do not take the following medicines unless your health care provider approves:  Nonsteroidal anti-inflammatory drugs (NSAIDs), such as ibuprofen, naproxen, or celecoxib.  Vitamin supplements that contain vitamin A, vitamin E, or both.  Manage other health conditions such as hypertension and diabetes as directed by your health care provider.  Follow a heart-healthy diet. A dietitian can help to educate you about healthy food options and changes.  Use healthy cooking methods such as roasting, grilling, broiling, baking, poaching, steaming, or stir-frying. Talk to a dietitian to learn more about healthy cooking methods.  Follow an exercise program approved by your health care provider.  Maintain a healthy weight. Lose weight as approved by your health care provider.  Plan rest periods when fatigued.  Learn to manage stress.  Do not  use any tobacco products, including cigarettes, chewing tobacco, or electronic cigarettes. If you need help quitting, ask your health care provider.  If you drink alcohol, and your health care provider approves, limit your alcohol intake to no more than 1 drink per day. One drink equals  12 ounces of beer, 5 ounces of wine, or 1 ounces of hard liquor.  Stop illegal drug use.  Your health care provider may ask you to monitor your blood pressure. A blood pressure reading consists of a higher number over a lower number, such as 110 over 72, which is written as 110/72. Ideally, your blood pressure should be:  Below 140/90 if you have no other medical conditions.  Below 130/80 if you have diabetes or kidney disease.  Keep all follow-up visits as directed by your health care provider. This is important. SEEK IMMEDIATE MEDICAL CARE IF:  You have pain in your chest, neck, arm, jaw, stomach, or back that lasts more than a few minutes, is recurring, or is unrelieved by taking medicine under your tongue (sublingualnitroglycerin).  You have profuse sweating without cause.  You have unexplained:  Heartburn or indigestion.  Shortness of breath or difficulty breathing.  Nausea or vomiting.  Fatigue.  Feelings of nervousness or anxiety.  Weakness.  Diarrhea.  You have sudden light-headedness or dizziness.  You faint. These symptoms may represent a serious problem that is an emergency. Do not wait to see if the symptoms will go away. Get medical help right away. Call your local emergency services (911 in the [place] Do not drive yourself to the hospital.   This information is not intended to replace advice given to you by your health care provider. Make sure you discuss any questions you have with your health care provider.   Document Released: 12/18/2013 Document Reviewed: 12/18/2013 Elsevier Interactive Patient Education 2016 [place] simvastatin, discontinue lipitor.  I will place a referral to cardiology for you to be further evaluated.  If chest pain occurs please go to nearest ED

## 2016-04-06 NOTE — Progress Notes (Signed)
Russell Ortiz , 17-Jan-1971, 45 y.o., male MRN: [id number] Patient Care Team    Relationship Specialty Notifications Start End  Russell Hillock, DO PCP - General Family Medicine  08/17/15     CC: chest discomfort.  Subjective: Pt presents for an acute OV with complaints of chest discomfort of  > 1 week duration.  Patient reports last week he experienced chest pain while running. He reports the pain was mid sternum and radiated first to the right  side of his chest and then to his left side of his chest. The initial pain was was sharp pain that lasted less than 1 minute after he quite running. The pain then last throughout 2 days intermittently. He also endorses his chest having a dull ache for a few nights and he felt a pain when driving. Hurt at night for 2 days, and then felt pain when driving x2. He denies diaphoresis, nausea, radiation to extremities or jaw. He start [place] 10/13 and thought it made him feel "jittery" and then he had the pain above so he is wondering if all the symptoms are from the Lipitor. He stopped the medicine 2 days ago. He reports he feels like his normal self and is back to baseline.He is a former smoker for about 4 years. He has hyperlipidemia. He denies anxiety.   No Known Allergies Social History  Substance Use Topics  . Smoking status: Former Smoker    Quit date: 06/06/1997  . Smokeless tobacco: Never Used  . Alcohol use Yes     Comment: socially   Past Medical History:  Diagnosis Date  . Genital warts   . Hyperlipidemia   . PUD (peptic ulcer disease)    Past Surgical History:  Procedure Laterality Date  . VASECTOMY  2010   Family History  Problem Relation Age of Onset  . Lung cancer Father   . Asthma Brother   . Diabetes Maternal Grandfather   . Diabetes Maternal Uncle   . Hyperlipidemia Maternal Grandmother      Medication List       Accurate as of 04/06/16  1:52 PM. Always use your most recent med list.          atorvastatin 40 MG  tablet Commonly known as:  LIPITOR Take 1 tablet (40 mg total) by mouth daily.   MULTIVITAMIN ADULT Chew Chew by mouth.       No results found for this or any previous visit (from the past 24 hour(s)). No results found.   ROS: Negative, with the exception of above mentioned in HPI   Objective:  BP 123/79 (BP Location: Right Arm, Patient Position: Sitting, Cuff Size: Large)   Pulse 73   Temp 98.4 F (36.9 C)   Resp 20   Wt 222 lb 4 oz (100.8 kg)   SpO2 98%   BMI 29.32 kg/m  Body mass index is 29.32 kg/m. Gen: Afebrile. No acute distress. Nontoxic in appearance, well developed, well nourished. Pleasant [name] HENT: AT. [place]. MMM Eyes:Pupils Equal Round Reactive to light, Extraocular movements intact,  Conjunctiva without redness, discharge or icterus. CV: RRR no murmur, no edema Chest: CTAB, no wheeze or crackles. Good air movement, normal resp effort.  Abd: Soft. NTND. BS present. MSK: no erythema, rash or skin changes, no TTP chest wall. Neuro: Normal gait. PERLA. EOMi. Alert. Oriented x3  Psych: Normal affect, dress and demeanor. Normal speech. Normal thought content and judgment. EKG:  SR. HR 66, PR 156, QT376. Mild  change in V6 QRS. Assessment/Plan: Russell Ortiz is a 45 y.o. male present for acute OV for chest pain on exertion.  Chest pain: occurred 2 days ago with exertion, in [name], former smoker, with severely elevated cholesterol.  - CXR, EKG ordered. EKG with very mild changes in V6 QRS morphology only.  - Discussed cardiology referral, and likely stress test.  - caution on exertion until seen by cardiology.   Hyperlipidemia: Pt certainly needs to lower his cholesterol and needs statin coverage. Uncertain if his discomfort was actually caused from lipitor or chest pain from exertion. DC Lipitor and trial of lower dose simvastatin.   > 25 minutes spent with patient, >50% of time spent face to face    electronically signed by:  Russell Pouch, DO  [place]

## 2016-04-07 ENCOUNTER — Ambulatory Visit (HOSPITAL_BASED_OUTPATIENT_CLINIC_OR_DEPARTMENT_OTHER)
Admission: RE | Admit: 2016-04-07 | Discharge: 2016-04-07 | Disposition: A | Discharge status: Home / Self Care | Payer: 59 | Source: Ambulatory Visit | Attending: Family Medicine | Admitting: Family Medicine

## 2016-04-07 DIAGNOSIS — R079 Chest pain, unspecified: Secondary | ICD-10-CM | POA: Diagnosis not present

## 2016-04-08 ENCOUNTER — Telehealth: Payer: Self-pay | Admitting: Family Medicine

## 2016-04-08 NOTE — Telephone Encounter (Signed)
Spoke with patient reviewed xray results and information regarding referral.

## 2016-04-08 NOTE — Telephone Encounter (Signed)
Please call pt: - cxr is normal. His cardiology referral has been placed.

## 2016-04-08 NOTE — Telephone Encounter (Signed)
Patient calling to request results of x-ray performed on yesterday.

## 2016-04-14 ENCOUNTER — Telehealth: Payer: Self-pay | Admitting: Family Medicine

## 2016-04-14 NOTE — Telephone Encounter (Signed)
He can stop medication. He is not able to tolerate the statin group. He will need to focus on dietary modifications and exercise to improve his cholesterol.  He should take a fish oil supplement if able to tolerate.   I added this to his allergies and dc'd the med in the system

## 2016-04-14 NOTE — Telephone Encounter (Signed)
Patient Name: FON RIJO DOB: 1971-04-19 Initial Comment Caller States- started some medicine and now my body is acting real funny Nurse Assessment Nurse: Ronnald Ramp, RN, [place] Date/Time (Eastern Time): 04/14/2016 10:08:50 AM Confirm and document reason for call. If symptomatic, describe symptoms. You must click the next button to save text entered. ---Caller states he started on a new medication for his cholesterol and "his body is feeling funny". He does not know when he started the medication, but he was seen last week and the med was changed and "decreased". He states he is not going to take the medication. Pt was difficult to get information from and with every question he just says "every since I started this medication my body feels funny". He was never able to voice what his question for the nurse is and just repeated himself. Pt because frustrated and disconnected the call. Has the patient traveled out of the country within the last 30 days? ---Not Applicable Does the patient have any new or worsening symptoms? ---No Guidelines Guideline Title Affirmed Question Affirmed Notes Final Disposition User Clinical Call Ronnald Ramp, RN, [employer]

## 2016-04-14 NOTE — Telephone Encounter (Signed)
Tried to call patient voice mail has not been set up unable to leave a message.

## 2016-04-15 ENCOUNTER — Encounter: Payer: Self-pay | Admitting: *Deleted

## 2016-04-15 NOTE — Telephone Encounter (Signed)
Tried to call patient mail box not set up. Sent message in my chart.

## 2016-04-18 NOTE — Progress Notes (Deleted)
Cardiology Office Note    Date:  04/18/2016   ID:  Russell Ortiz, DOB 1970-09-24, MRN [id number]  PCP:  Howard Pouch, DO  Cardiologist:  New  Chief Complaint: Chest pain  History of Present Illness:   Russell Ortiz is a 45 y.o. male with hx of HLD, PUD and former smoker who presented for evaluation of chest pain.   Recently seen by PCP 04/06/16 for few days hx of intermitted chest pain. EKG showed NSR at rate of 65 bpm.    Past Medical History:  Diagnosis Date  . Genital warts   . Hyperlipidemia   . PUD (peptic ulcer disease)     Past Surgical History:  Procedure Laterality Date  . VASECTOMY  2010    Current Medications: Prior to Admission medications   Medication Sig Start Date End Date Taking? Authorizing Provider  Multiple Vitamins-Minerals (MULTIVITAMIN ADULT) CHEW Chew by mouth.    Historical Provider, MD    Allergies:   Statins   Social History   Social History  . Marital status: Married    Spouse name: N/A  . Number of children: 1  . Years of education: 17   Occupational History  . truck driver    Social History Main Topics  . Smoking status: Former Smoker    Quit date: 06/06/1997  . Smokeless tobacco: Never Used  . Alcohol use Yes     Comment: socially  . Drug use: No  . Sexual activity: Yes    Partners: Female     Comment: married   Other Topics Concern  . Not on file   Social History Narrative   Married. 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    Wears seat belt. Exercises 3x or greater a week.    Former Smoker. Quit 1999, Occasional ETOH, no recreational drugs.    Drinks caffeine beverages. Uses herbal remedies. Takes a multivitamin.    Smoker alarm in the home.    Feels safe in his relationships.         Family History:  The patient's family history includes Asthma in his brother; Diabetes in his maternal grandfather and maternal uncle; Hyperlipidemia in his maternal grandmother; Lung cancer  in his father. ***  ROS:   Please see the history of present illness.    ROS All other systems reviewed and are negative.   PHYSICAL EXAM:   VS:  There were no vitals taken for this visit.   GEN: Well nourished, well developed, in no acute distress  HEENT: normal  Neck: no JVD, carotid bruits, or masses Cardiac: ***RRR; no murmurs, rubs, or gallops,no edema  Respiratory:  clear to auscultation bilaterally, normal work of breathing GI: soft, nontender, nondistended, + BS MS: no deformity or atrophy  Skin: warm and dry, no rash Neuro:  Alert and Oriented x 3, Strength and sensation are intact Psych: euthymic mood, full affect  Wt Readings from Last 3 Encounters:  04/06/16 222 lb 4 oz (100.8 kg)  03/17/16 222 lb 4 oz (100.8 kg)  08/17/15 200 lb 8 oz (90.9 kg)      Studies/Labs Reviewed:   EKG:  EKG is ordered today.  The ekg ordered today demonstrates ***  Recent Labs: 03/17/2016: BUN 9; Creatinine, Ser 1.04; Hemoglobin 15.7; Platelets 195.0; Potassium 4.3; Sodium 139   Lipid Panel    Component Value Date/Time   CHOL 307 (H) 03/17/2016 0929   TRIG 115.0 03/17/2016 0929  HDL 54.10 03/17/2016 0929   CHOLHDL 6 03/17/2016 0929   VLDL 23.0 03/17/2016 0929   LDLCALC 229 (H) 03/17/2016 0929   LDLDIRECT 231.1 03/29/2012 0927    Additional studies/ records that were reviewed today include:   As above  ASSESSMENT & PLAN:    1. Chest pain  2. HLD - 03/17/2016: Cholesterol 307; HDL 54.10; LDL Cholesterol 229; Triglycerides 115.0; VLDL 23.0  - He was on lipitor previously changed to Simvastatin by PCP 04/06/16.     Medication Adjustments/Labs and Tests Ordered: Current medicines are reviewed at length with the patient today.  Concerns regarding medicines are outlined above.  Medication changes, Labs and Tests ordered today are listed in the Patient Instructions below. There are no Patient Instructions on file for this visit.   Jarrett Soho, [place]    04/18/2016 7:58 AM    [facility] [place], [place], [place]  [address] Phone: [phone]; Fax: [phone]

## 2016-04-19 ENCOUNTER — Ambulatory Visit: Payer: Self-pay | Admitting: Physician Assistant

## 2016-05-04 NOTE — Progress Notes (Signed)
Cardiology Office Note    Date:  05/05/2016   ID:  [name], [name] November 23, 1970, MRN [id number]  PCP:  Howard Pouch, DO  Cardiologist:  [facility] (Dr. Burt Knack)  Chief Complaint: Chest pain  History of Present Illness:   Russell Ortiz is a 45 y.o. male with hx of HLD and PUD who seen by PCP 04/06/16 for chest pain presented for further evaluation.   The patient was started on lipitor 40mg  10/17. PT had developed chest pain with muscle ache and jittering sensation after wards. During PCP visit 04/06/16 his lipitor was discontinued for possible etiology of chest pain and started on Simvastatin 20mg . EKG showed NSR at rate of 66 bpm. CXR unremarkable. He took 3 doses of simvastatin and then self discontinued due to muscle ache. Since then he had noted gradual improvement of muscle ache. Continues to have intermittent chest pain. Described as "soming thing pulling" underneath left breath along with muscle ache all over his upper body.   Former smoke, quit 1999. Before this he was smoking 1 pack /week for 5-7 years. Occasional alcohol abuse. 03/17/2016: Cholesterol 307; HDL 54.10; LDL Cholesterol 229; Triglycerides 115.0; VLDL 23.0  He takes fish oil 1gm qd.   Denies family hx of CAD. The patient denies nausea, vomiting, fever, palpitations, shortness of breath, orthopnea, PND, dizziness, syncope, cough, congestion, abdominal pain, hematochezia, melena, lower extremity edema. He is enrolled in gym however hasn't started yet.    Past Medical History:  Diagnosis Date  . Genital warts   . Hyperlipidemia   . PUD (peptic ulcer disease)     Past Surgical History:  Procedure Laterality Date  . VASECTOMY  2010    Current Medications: Prior to Admission medications   Medication Sig Start Date End Date Taking? Authorizing Provider  Multiple Vitamins-Minerals (MULTIVITAMIN ADULT) CHEW Chew by mouth.    Historical Provider, MD    Allergies:   Statins   Social History   Social History  . Marital  status: Married    Spouse name: N/A  . Number of children: 1  . Years of education: 78   Occupational History  . truck driver    Social History Main Topics  . Smoking status: Former Smoker    Quit date: 06/06/1997  . Smokeless tobacco: Never Used  . Alcohol use Yes     Comment: socially  . Drug use: No  . Sexual activity: Yes    Partners: Female     Comment: married   Other Topics Concern  . None   Social History Narrative   Married. 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    Wears seat belt. Exercises 3x or greater a week.    Former Smoker. Quit 1999, Occasional ETOH, no recreational drugs.    Drinks caffeine beverages. Uses herbal remedies. Takes a multivitamin.    Smoker alarm in the home.    Feels safe in his relationships.         Family History:  The patient's family history includes Asthma in his brother; Diabetes in his maternal grandfather and maternal uncle; Hyperlipidemia in his maternal grandmother; Lung cancer in his father.   ROS:   Please see the history of present illness.    ROS All other systems reviewed and are negative.   PHYSICAL EXAM:   VS:  BP 130/90   Pulse 72   Ht 6\' 1"  (1.854 m)   Wt 221 lb (100.2 kg)  BMI 29.16 kg/m    GEN: Well nourished, well developed, in no acute distress  HEENT: normal  Neck: no JVD, carotid bruits, or masses Cardiac:RRR; no murmurs, rubs, or gallops,no edema  Respiratory:  clear to auscultation bilaterally, normal work of breathing GI: soft, nontender, nondistended, + BS MS: no deformity or atrophy  Skin: warm and dry, no rash Neuro:  Alert and Oriented x 3, Strength and sensation are intact Psych: euthymic mood, full affect  Wt Readings from Last 3 Encounters:  05/05/16 221 lb (100.2 kg)  04/06/16 222 lb 4 oz (100.8 kg)  03/17/16 222 lb 4 oz (100.8 kg)      Studies/Labs Reviewed:   EKG:  EKG is not ordered today.    Recent Labs: 03/17/2016: BUN 9;  Creatinine, Ser 1.04; Hemoglobin 15.7; Platelets 195.0; Potassium 4.3; Sodium 139   Lipid Panel    Component Value Date/Time   CHOL 307 (H) 03/17/2016 0929   TRIG 115.0 03/17/2016 0929   HDL 54.10 03/17/2016 0929   CHOLHDL 6 03/17/2016 0929   VLDL 23.0 03/17/2016 0929   LDLCALC 229 (H) 03/17/2016 0929   LDLDIRECT 231.1 03/29/2012 0927    Additional studies/ records that were reviewed today include:   As above   ASSESSMENT & PLAN:    1. Chest pain - Atypical. EKG is reassuring. Gradual improvement since discontinuation of statin. No family hx of CAD or premature death. His cardiac risk factors includes hyperlipidemia and remote smoking. Will get ETT. F/u PRN unless abnormal stress test. May consider trial of PPI given hx of PPI.   2. HLD - 03/17/2016: Cholesterol 307; HDL 54.10; LDL Cholesterol 229; Triglycerides 115.0; VLDL 23.0  - continue fish oil. Advised lifestyle change with heart healthy diet and cardiac exercise. This has been discussed in great detailed. He is enrolled in gym.     Medication Adjustments/Labs and Tests Ordered: Current medicines are reviewed at length with the patient today.  Concerns regarding medicines are outlined above.  Medication changes, Labs and Tests ordered today are listed in the Patient Instructions below. Patient Instructions  Your physician recommends that you continue on your current medications as directed. Please refer to the Current Medication list given to you today.  Your physician has requested that you have an exercise tolerance test. For further information please visit [email or web address]. Please also follow instruction sheet, as given.   Your physician recommends that you schedule a follow-up appointment in: AS NEEDED     Jarrett Soho, [place]  05/05/2016 3:29 PM    [address], [place], [place]  [address] Phone: [phone]; Fax: [phone]

## 2016-05-05 ENCOUNTER — Ambulatory Visit (INDEPENDENT_AMBULATORY_CARE_PROVIDER_SITE_OTHER): Payer: 59 | Admitting: Physician Assistant

## 2016-05-05 ENCOUNTER — Encounter: Payer: Self-pay | Admitting: Physician Assistant

## 2016-05-05 VITALS — BP 130/90 | HR 72 | Ht 73.0 in | Wt 221.0 lb

## 2016-05-05 DIAGNOSIS — R079 Chest pain, unspecified: Secondary | ICD-10-CM

## 2016-05-05 DIAGNOSIS — E782 Mixed hyperlipidemia: Secondary | ICD-10-CM

## 2016-05-05 NOTE — Patient Instructions (Signed)
Your physician recommends that you continue on your current medications as directed. Please refer to the Current Medication list given to you today.  Your physician has requested that you have an exercise tolerance test. For further information please visit [email or web address]. Please also follow instruction sheet, as given.    Your physician recommends that you schedule a follow-up appointment in:  AS  NEEDED 

## 2016-05-13 ENCOUNTER — Encounter: Payer: Self-pay | Admitting: Physician Assistant

## 2016-05-19 ENCOUNTER — Ambulatory Visit (INDEPENDENT_AMBULATORY_CARE_PROVIDER_SITE_OTHER): Payer: 59

## 2016-05-19 DIAGNOSIS — R079 Chest pain, unspecified: Secondary | ICD-10-CM | POA: Diagnosis not present

## 2016-05-19 LAB — EXERCISE TOLERANCE TEST
CHL CUP RESTING HR STRESS: 55 {beats}/min
CHL RATE OF PERCEIVED EXERTION: 17
CSEPEW: 13.2 METS
Exercise duration (min): 10 min
Exercise duration (sec): 56 s
MPHR: 176 {beats}/min
Peak HR: 162 {beats}/min
Percent HR: 92 %

## 2016-05-25 ENCOUNTER — Encounter: Payer: Self-pay | Admitting: *Deleted

## 2016-05-31 ENCOUNTER — Telehealth: Payer: Self-pay | Admitting: Physician Assistant

## 2016-05-31 NOTE — Telephone Encounter (Signed)
New message  ° ° °Patient calling back for test results.  °

## 2016-05-31 NOTE — Telephone Encounter (Signed)
Returned the pts call at the # [phone] and the person that answered said he didn't know a Carrie Tawil.

## 2016-06-01 ENCOUNTER — Telehealth: Payer: Self-pay | Admitting: Physician Assistant

## 2016-06-01 NOTE — Telephone Encounter (Signed)
Follow Up  ° °Returning call.  °

## 2016-06-01 NOTE — Telephone Encounter (Signed)
Returned pts call and we discussed his ETT results and to watch his bp and try to get it 140/90 or below. Pt verbalized understanding.

## 2016-08-22 ENCOUNTER — Telehealth: Payer: Self-pay | Admitting: Family Medicine

## 2016-08-22 NOTE — Telephone Encounter (Signed)
Patient would like to know if he can come in to get bloodwork done on O2 levels and see why his body has a odor.   Unable to leave message on voicemail due to forgetting his password.   Please call patient to advise.

## 2016-08-22 NOTE — Telephone Encounter (Signed)
I am not certain what he is suggesting or thinking by his question. We can test his oxygenation just by our normal vitals exam. Oxygenation however, does not play a role in body odor to my knowledge.  I would suggest he make an appt to discuss the changes he is expereincing if they are concerning him and then we can decide what type of work up to pursue.

## 2016-08-22 NOTE — Telephone Encounter (Signed)
Patient notified and verbalized understanding.  Appt made by Diane.

## 2016-08-23 NOTE — Telephone Encounter (Signed)
Patient called and said that the call dropped yesterday as he was speaking to someone on making an appointment. I made his appointment for Wednesday at 2:45pm.

## 2016-08-24 ENCOUNTER — Ambulatory Visit: Payer: Self-pay | Admitting: Family Medicine

## 2016-08-25 ENCOUNTER — Encounter: Payer: Self-pay | Admitting: Family Medicine

## 2016-08-25 ENCOUNTER — Ambulatory Visit (INDEPENDENT_AMBULATORY_CARE_PROVIDER_SITE_OTHER): Payer: 59 | Admitting: Family Medicine

## 2016-08-25 VITALS — BP 120/83 | HR 64 | Temp 98.4°F | Resp 20 | Wt 218.8 lb

## 2016-08-25 DIAGNOSIS — R7989 Other specified abnormal findings of blood chemistry: Secondary | ICD-10-CM

## 2016-08-25 DIAGNOSIS — R5383 Other fatigue: Secondary | ICD-10-CM

## 2016-08-25 DIAGNOSIS — R21 Rash and other nonspecific skin eruption: Secondary | ICD-10-CM | POA: Insufficient documentation

## 2016-08-25 DIAGNOSIS — N481 Balanitis: Secondary | ICD-10-CM | POA: Diagnosis not present

## 2016-08-25 DIAGNOSIS — R945 Abnormal results of liver function studies: Secondary | ICD-10-CM

## 2016-08-25 DIAGNOSIS — R7309 Other abnormal glucose: Secondary | ICD-10-CM | POA: Insufficient documentation

## 2016-08-25 LAB — CBC WITH DIFFERENTIAL/PLATELET
BASOS ABS: 0.1 10*3/uL (ref 0.0–0.1)
BASOS PCT: 2.1 % (ref 0.0–3.0)
EOS ABS: 0.2 10*3/uL (ref 0.0–0.7)
Eosinophils Relative: 6.5 % — ABNORMAL HIGH (ref 0.0–5.0)
HCT: 47.1 % (ref 39.0–52.0)
HEMOGLOBIN: 15.3 g/dL (ref 13.0–17.0)
LYMPHS PCT: 49.8 % — AB (ref 12.0–46.0)
Lymphs Abs: 1.8 10*3/uL (ref 0.7–4.0)
MCHC: 32.5 g/dL (ref 30.0–36.0)
MCV: 84.5 fl (ref 78.0–100.0)
Monocytes Absolute: 0.2 10*3/uL (ref 0.1–1.0)
Monocytes Relative: 5.6 % (ref 3.0–12.0)
Neutro Abs: 1.3 10*3/uL — ABNORMAL LOW (ref 1.4–7.7)
Neutrophils Relative %: 36 % — ABNORMAL LOW (ref 43.0–77.0)
Platelets: 188 10*3/uL (ref 150.0–400.0)
RBC: 5.57 Mil/uL (ref 4.22–5.81)
RDW: 14.1 % (ref 11.5–15.5)
WBC: 3.7 10*3/uL — AB (ref 4.0–10.5)

## 2016-08-25 LAB — COMPLETE METABOLIC PANEL WITH GFR
ALT: 44 U/L (ref 9–46)
AST: 53 U/L — AB (ref 10–40)
Albumin: 4.6 g/dL (ref 3.6–5.1)
Alkaline Phosphatase: 49 U/L (ref 40–115)
BUN: 9 mg/dL (ref 7–25)
CHLORIDE: 105 mmol/L (ref 98–110)
CO2: 27 mmol/L (ref 20–31)
CREATININE: 1.12 mg/dL (ref 0.60–1.35)
Calcium: 9.7 mg/dL (ref 8.6–10.3)
GFR, Est African American: >89 mL/min (ref >=60)
GFR, Est Non African American: 79 mL/min (ref >=60)
Glucose, Bld: 86 mg/dL (ref 65–99)
POTASSIUM: 4.5 mmol/L (ref 3.5–5.3)
SODIUM: 141 mmol/L (ref 135–146)
Total Bilirubin: 0.7 mg/dL (ref 0.2–1.2)
Total Protein: 7.5 g/dL (ref 6.1–8.1)

## 2016-08-25 LAB — TSH: TSH: 1.98 u[IU]/mL (ref 0.35–4.50)

## 2016-08-25 LAB — HEMOGLOBIN A1C
Hgb A1c MFr Bld: 5.6 % (ref <5.7)
MEAN PLASMA GLUCOSE: 114 mg/dL

## 2016-08-25 MED ORDER — CLOTRIMAZOLE 1 % EX CREA: 1.0000 "application " | TOPICAL_CREAM | Freq: Two times a day (BID) | CUTANEOUS | 0 refills | Status: DC

## 2016-08-25 NOTE — Progress Notes (Signed)
Russell Ortiz , [birth year], 46 y.o., male MRN: [id number] Patient Care Team    Relationship Specialty Notifications Start End  Ma Hillock, DO PCP - General Family Medicine  08/17/15     CC: Fatigue Subjective:   Patient presents for office visit with complaints of fatigue, rash and changes in body odor. He reports approximately one month ago he has noticed increase in fatigue which occurs shortly after awakening in the morning. Patient does endorse snoring and possible report of catching his breath while sleeping by his significant other. He denies falling asleep at long stop lights. He does endorse feeling drowsy and pulling over to take naps. He is a Administrator. He reports even on his way to work in the morning he feels tired. However he states he feels refreshed when he wakes up.  He also reports a dry rash between his shoulder blades, which is mildly red and itchy.. A rash on the head of his penis. He denies penile discharge. He denies painful lesions. He states that he stopped using deodorant because he had heard certain changes and smells of body odor can mean low oxygen levels, and he thought maybe that's why he was fatigued. He has noticed a body odor, which he is unable to describe. He does state he feels different than before.  Depression screen PHQ 2/9 03/17/2016  Decreased Interest 0  Down, Depressed, Hopeless 0  PHQ - 2 Score 0    Allergies  Allergen Reactions  . Statins Other (See Comments)    mylagia   Social History  Substance Use Topics  . Smoking status: Former Smoker    Quit date: 06/06/1997  . Smokeless tobacco: Never Used  . Alcohol use Yes     Comment: socially   Past Medical History:  Diagnosis Date  . Genital warts   . Hyperlipidemia   . PUD (peptic ulcer disease)    Past Surgical History:  Procedure Laterality Date  . VASECTOMY  2010   Family History  Problem Relation Age of Onset  . Lung cancer Father   . Asthma Brother   . Diabetes  Maternal Grandfather   . Diabetes Maternal Uncle   . Hyperlipidemia Maternal Grandmother    Allergies as of 08/25/2016      Reactions   Statins Other (See Comments)   mylagia      Medication List       Accurate as of 08/25/16  9:01 AM. Always use your most recent med list.          MULTIVITAMIN ADULT Chew Chew by mouth.       No results found for this or any previous visit (from the past 24 hour(s)). No results found.   ROS: Negative, with the exception of above mentioned in HPI   Objective:  BP 120/83 (BP Location: Right Arm, Patient Position: Sitting, Cuff Size: Large)   Pulse 64   Temp 98.4 F (36.9 C)   Resp 20   Wt 218 lb 12 oz (99.2 kg)   SpO2 98%   BMI 28.86 kg/m  Body mass index is 28.86 kg/m. Gen: Afebrile. No acute distress. Nontoxic in appearance, well developed, well nourished. African-American male. HENT: AT. Comern­o.MMM, no oral lesions. Eyes:Pupils Equal Round Reactive to light, Extraocular movements intact,  Conjunctiva without redness, discharge or icterus. Neck/lymp/endocrine: Supple, no lymphadenopathy, no thyromegaly CV: RRR no murmur, no edema Chest: CTAB, no wheeze or crackles.   Skin: Baseball sized patch of mildly red raised  rash directly between shoulder blades. Male genitalia: Penis: circumcised, balanitis and 4 small white plaque-like patches along rim of glands penis  Assessment/Plan: [name] is a 46 y.o. male present for OV for  Other fatigue/Abnormal CBC - Discussed with patient fatigue has many possible etiologies. We'll start with basic lab work to rule out diabetes since he had an elevated A1c a few months ago. Rule out thyroid disorder, kidney or liver disorder. Patient did have a mildly abnormal liver function panel in the past.  - He tested negative for hepatitis B/C in 2013 and negative HIV 2016 - CBC w/Diff - Hemoglobin A1c - TSH - COMPLETE METABOLIC PANEL WITH GFR - Patient's discussion surrounding body odor is  difficult to understand. It sounds like he stopped using deodorant, noticed more body odor and became concerned that his oxygen levels with low. Discussed with him that body odor can come from many things that we consume and extreme cases can be from medical causes. It does not sound other people were noticing increasing [place] on him. Advised him on hygiene and using deodorant. -  Increased fatigue could be secondary to sleep apnea. If all lab values do not point to a potential cause of fatigue, would consider pulmonology referral for sleep study. Elevated hemoglobin A1c - last a1c 5.9 03/2016.  - Hemoglobin A1c  Balanitis/back rash - Areas do not appear to be warty in nature or appear consistent with HSV - More consistent with yeast appearance. - Discussed hygiene - Rash on back appeared to be more of a dermatitis appearance, OTC hydrocortisone trial vs hygiene issue. Did not appear to be comedones. - clotrimazole (LOTRIMIN) 1 % cream; Apply 1 application topically 2 (two) times daily.  Dispense: 30 g; Refill: 0 - Follow-up when necessary  Reviewed expectations re: course of current medical issues.  Discussed self-management of symptoms.  Outlined signs and symptoms indicating need for more acute intervention.  Patient verbalized understanding and all questions were answered.  Patient received an After-Visit Summary.   electronically signed by:  Howard Pouch, DO  [place]

## 2016-08-25 NOTE — Patient Instructions (Signed)
We will call when I return to discuss labs.  Start the cream as we discussed.  Use aluminum based deodorants for best protection.   Please help [place] help you:  We are honored you have chosen [facility] for your [facility] home. Below you will find basic instructions that you may need to access in the future. Please help [place] help you by reading the instructions, which cover many of the frequent questions we experience.   Prescription refills and request:  -In order to allow more efficient response time, please call your pharmacy for all refills. They will forward the request electronically to [place]. This allows for the quickest possible response. Request left on a nurse line can take longer to refill, since these are checked as time allows between office patients and other phone calls.  - refill request can take up to 3-5 working days to complete.  - If request is sent electronically and request is appropiate, it is usually completed in 1-2 business days.  - all patients will need to be seen routinely for all chronic medical conditions requiring prescription medications (see follow-up below). If you are overdue for follow up on your condition, you will be asked to make an appointment and we will call in enough medication to cover you until your appointment (up to 30 days).  - all controlled substances will require a face to face visit to request/refill.  - if you desire your prescriptions to go through a new pharmacy, and have an active script at original pharmacy, you will need to call your pharmacy and have scripts transferred to new pharmacy. This is completed between the pharmacy locations and not by your provider.    Results: If any images or labs were ordered, it can take up to 1 week to get results depending on the test ordered and the lab/facility running and resulting the test. - Normal or stable results, which do not need further discussion, will be released to your mychart immediately  with attached note to you. A call will not be generated for normal results. Please make certain to sign up for mychart. If you have questions on how to activate your mychart you can call the front office.  - If your results need further discussion, our office will attempt to contact you via phone, and if unable to reach you after 2 attempts, we will release your abnormal result to your mychart with instructions.  - All results will be automatically released in mychart after 1 week.  - Your provider will provide you with explanation and instruction on all relevant material in your results. Please keep in mind, results and labs may appear confusing or abnormal to the untrained eye, but it does not mean they are actually abnormal for you personally. If you have any questions about your results that are not covered, or you desire more detailed explanation than what was provided, you should make an appointment with your provider to do so.   Our office handles many outgoing and incoming calls daily. If we have not contacted you within 1 week about your results, please check your mychart to see if there is a message first and if not, then contact our office.  In helping with this matter, you help decrease call volume, and therefore allow [place] to be able to respond to patients needs more efficiently.   Acute office visits (sick visit):  An acute visit is intended for a new problem and are scheduled in shorter time  slots to allow schedule openings for patients with new problems. This is the appropriate visit to discuss a new problem. In order to provide you with excellent quality medical care with proper time for you to explain your problem, have an exam and receive treatment with instructions, these appointments should be limited to one new problem per visit. If you experience a new problem, in which you desire to be addressed, please make an acute office visit, we save openings on the schedule to accommodate you.  Please do not save your new problem for any other type of visit, let us take care of it properly and quickly for you.   Follow up visits:  Depending on your condition(s) your provider will need to see you routinely in order to provide you with quality care and prescribe medication(s). Most chronic conditions (Example: hypertension, Diabetes, depression/anxiety... etc), require visits a couple times a year. Your provider will instruct you on proper follow up for your personal medical conditions and history. Please make certain to make follow up appointments for your condition as instructed. Failing to do so could result in lapse in your medication treatment/refills. If you request a refill, and are overdue to be seen on a condition, we will always provide you with a 30 day script (once) to allow you time to schedule.    Medicare wellness (well visit): - we have a wonderful Nurse Maudie Mercury), that will meet with you and provide you will yearly medicare wellness visits. These visits should occur yearly (can not be scheduled less than 1 calendar year apart) and cover preventive health, immunizations, advance directives and screenings you are entitled to yearly through your medicare benefits. Do not miss out on your entitled benefits, this is when medicare will pay for these benefits to be ordered for you.  These are strongly encouraged by your provider and is the appropriate type of visit to make certain you are up to date with all preventive health benefits. If you have not had your medicare wellness exam in the last 12 months, please make certain to schedule one by calling the office and schedule your medicare wellness with Maudie Mercury as soon as possible.   Yearly physical (well visit):  - Adults are recommended to be seen yearly for physicals. Check with your insurance and date of your last physical, most insurances require one calendar year between physicals. Physicals include all preventive health topics, screenings,  medical exam and labs that are appropriate for gender/age and history. You may have fasting labs needed at this visit. This is a well visit (not a sick visit), acute topics should not be covered during this visit.  - Pediatric patients are seen more frequently when they are younger. Your provider will advise you on well child visit timing that is appropriate for your their age. - This is not a medicare wellness visit. Medicare wellness exams do not have an exam portion to the visit. Some medicare companies allow for a physical, some do not allow a yearly physical. If your medicare allows a yearly physical you can schedule the medicare wellness with our nurse Maudie Mercury and have your physical with your provider after, on the same day. Please check with insurance for your full benefits.   Late Policy/No Shows:  - all new patients should arrive 15-30 minutes earlier than appointment to allow [place] time  to  obtain all personal demographics,  insurance information and for you to complete office paperwork. - All established patients should arrive 10-15 minutes earlier  than appointment time to update all information and be checked in .  - In our best efforts to run on time, if you are late for your appointment you will be asked to either reschedule or if able, we will work you back into the schedule. There will be a wait time to work you back in the schedule,  depending on availability.  - If you are unable to make it to your appointment as scheduled, please call 24 hours ahead of time to allow [place] to fill the time slot with someone else who needs to be seen. If you do not cancel your appointment ahead of time, you may be charged a no show fee.

## 2016-08-26 ENCOUNTER — Telehealth: Payer: Self-pay | Admitting: Family Medicine

## 2016-08-26 DIAGNOSIS — R945 Abnormal results of liver function studies: Secondary | ICD-10-CM

## 2016-08-26 DIAGNOSIS — R7989 Other specified abnormal findings of blood chemistry: Secondary | ICD-10-CM

## 2016-08-26 DIAGNOSIS — R5383 Other fatigue: Secondary | ICD-10-CM

## 2016-08-26 NOTE — Telephone Encounter (Signed)
Spoke with patient reviewed lab results and instructions . Patient states he will call back to schedule a lab appt.

## 2016-08-26 NOTE — Telephone Encounter (Signed)
Please call pt: - his labs are overall are stable. I would like him to have one lab repeated in 2 weeks. His white cell count was a little abnormal, could be secondary acute illness.  - his a1c is improved from last collection, and is normal. No diabetes.  He can use the cream prescribed on his back area as well. - I have placed the future labs.

## 2016-11-17 ENCOUNTER — Other Ambulatory Visit: Payer: Self-pay

## 2016-11-18 ENCOUNTER — Other Ambulatory Visit: Payer: Self-pay

## 2016-11-22 ENCOUNTER — Other Ambulatory Visit: Payer: Self-pay | Admitting: Family Medicine

## 2016-11-22 DIAGNOSIS — N481 Balanitis: Secondary | ICD-10-CM

## 2016-11-23 ENCOUNTER — Other Ambulatory Visit: Payer: Self-pay | Admitting: Family Medicine

## 2016-11-23 DIAGNOSIS — N481 Balanitis: Secondary | ICD-10-CM

## 2016-11-23 MED ORDER — CLOTRIMAZOLE 1 % EX CREA: 1.0000 "application " | TOPICAL_CREAM | Freq: Two times a day (BID) | CUTANEOUS | 0 refills | Status: DC

## 2016-11-23 NOTE — Progress Notes (Signed)
Reordered clotrimazole for him.

## 2016-11-24 ENCOUNTER — Other Ambulatory Visit (INDEPENDENT_AMBULATORY_CARE_PROVIDER_SITE_OTHER): Payer: 59

## 2016-11-24 ENCOUNTER — Encounter: Payer: Self-pay | Admitting: Family Medicine

## 2016-11-24 DIAGNOSIS — E785 Hyperlipidemia, unspecified: Secondary | ICD-10-CM | POA: Diagnosis not present

## 2016-11-24 DIAGNOSIS — R7989 Other specified abnormal findings of blood chemistry: Secondary | ICD-10-CM

## 2016-11-24 DIAGNOSIS — Z79899 Other long term (current) drug therapy: Secondary | ICD-10-CM

## 2016-11-24 DIAGNOSIS — D729 Disorder of white blood cells, unspecified: Secondary | ICD-10-CM

## 2016-11-24 DIAGNOSIS — R5383 Other fatigue: Secondary | ICD-10-CM

## 2016-11-24 DIAGNOSIS — R945 Abnormal results of liver function studies: Secondary | ICD-10-CM

## 2016-11-24 LAB — HEPATIC FUNCTION PANEL
ALT: 48 U/L (ref 0–53)
AST: 60 U/L — ABNORMAL HIGH (ref 0–37)
Albumin: 4.7 g/dL (ref 3.5–5.2)
Alkaline Phosphatase: 50 U/L (ref 39–117)
BILIRUBIN DIRECT: 0.1 mg/dL (ref 0.0–0.3)
BILIRUBIN TOTAL: 0.7 mg/dL (ref 0.2–1.2)
Total Protein: 7.2 g/dL (ref 6.0–8.3)

## 2016-11-24 LAB — CBC WITH DIFFERENTIAL/PLATELET
BASOS PCT: 0 %
Basophils Absolute: 0 cells/uL (ref 0–200)
EOS ABS: 210 {cells}/uL (ref 15–500)
Eosinophils Relative: 6 %
HEMATOCRIT: 46.7 % (ref 38.5–50.0)
Hemoglobin: 15 g/dL (ref 13.2–17.1)
Lymphocytes Relative: 54 %
Lymphs Abs: 1890 cells/uL (ref 850–3900)
MCH: 27.2 pg (ref 27.0–33.0)
MCHC: 32.1 g/dL (ref 32.0–36.0)
MCV: 84.6 fL (ref 80.0–100.0)
MONO ABS: 210 {cells}/uL (ref 200–950)
MPV: 10 fL (ref 7.5–12.5)
Monocytes Relative: 6 %
NEUTROS ABS: 1190 {cells}/uL — AB (ref 1500–7800)
Neutrophils Relative %: 34 %
Platelets: 162 10*3/uL (ref 140–400)
RBC: 5.52 MIL/uL (ref 4.20–5.80)
RDW: 14.4 % (ref 11.0–15.0)
WBC: 3.5 10*3/uL — ABNORMAL LOW (ref 3.8–10.8)

## 2016-11-24 LAB — LIPID PANEL
CHOL/HDL RATIO: 5
Cholesterol: 292 mg/dL — ABNORMAL HIGH (ref 0–200)
HDL: 53.7 mg/dL (ref >39.00)
LDL CALC: 206 mg/dL — AB (ref 0–99)
NonHDL: 238.51
Triglycerides: 165 mg/dL — ABNORMAL HIGH (ref 0.0–149.0)
VLDL: 33 mg/dL (ref 0.0–40.0)

## 2016-11-25 LAB — PATHOLOGIST SMEAR REVIEW

## 2016-11-28 ENCOUNTER — Ambulatory Visit (INDEPENDENT_AMBULATORY_CARE_PROVIDER_SITE_OTHER): Payer: 59 | Admitting: Family Medicine

## 2016-11-28 ENCOUNTER — Encounter: Payer: Self-pay | Admitting: Family Medicine

## 2016-11-28 ENCOUNTER — Telehealth: Payer: Self-pay | Admitting: Family Medicine

## 2016-11-28 VITALS — BP 132/81 | HR 64 | Temp 98.4°F | Resp 20 | Wt 219.5 lb

## 2016-11-28 DIAGNOSIS — E782 Mixed hyperlipidemia: Secondary | ICD-10-CM

## 2016-11-28 DIAGNOSIS — Z6828 Body mass index (BMI) 28.0-28.9, adult: Secondary | ICD-10-CM | POA: Diagnosis not present

## 2016-11-28 DIAGNOSIS — R7989 Other specified abnormal findings of blood chemistry: Secondary | ICD-10-CM | POA: Diagnosis not present

## 2016-11-28 DIAGNOSIS — E663 Overweight: Secondary | ICD-10-CM | POA: Insufficient documentation

## 2016-11-28 DIAGNOSIS — R945 Abnormal results of liver function studies: Secondary | ICD-10-CM

## 2016-11-28 DIAGNOSIS — N481 Balanitis: Secondary | ICD-10-CM | POA: Diagnosis not present

## 2016-11-28 NOTE — Telephone Encounter (Signed)
Spoke with patient reviewed information and scheduled patient appt to review lab results.

## 2016-11-28 NOTE — Patient Instructions (Addendum)
Increase fish oil to 4000 mg a day.  Think about krill oil or red yeast rice.  Increase fiber daily.   Repeat your lab in 3-4 months to recheck the WBC count.  Your neurophil count is mildly low. This can be secondary to  African descent. Would want to monitor and make certain it does not continue to drop.    Leukopenia (low WBC count) Leukopenia is a condition in which you have a low number of white blood cells. White blood cells help the body to fight infections. The number of white blood cells in the body varies from person to person. There are five types of white blood cells. Two types (lymphocytes and neutrophils) make up most of the white blood cell count. When lymphocytes are low, the condition is called lymphocytopenia. When neutrophils are low, it is called neutropenia. Neutropenia is the most dangerous type of leukopenia because it can lead to dangerous infections. What are the causes? This condition is commonly caused by damage to soft tissue inside of the bones (bone marrow), which is where most white blood cells are made. Bone marrow can get damaged by:  Medicine or X-ray treatments for cancer (chemotherapy or radiation therapy).  Serious infections.  Cancer of the white blood cells (leukemia, lymphoma, or myeloma).  Medicines, including: ? Certain antibiotics. ? Certain heart medicines. ? Steroids. ? Certain medicines used to treat diseases of the immune system (autoimmune diseases), like rheumatoid arthritis.  Leukopenia also happens when white blood cells are destroyed after leaving the bone marrow, which may result from:  Liver disease.  Autoimmune disease.  Vitamin B deficiencies.  What are the signs or symptoms? One of the most common signs of leukopenia, especially severe neutropenia, is having a lot of bacterial infections. Different infections have different symptoms. An infection in your lungs may cause coughing. A urinary tract infection may cause frequent  urination and a burning sensation. You may also get infections of the blood, skin, rectum, throat, sinuses, or ears. Some people have no symptoms. If you do have symptoms, they may include:  Fever.  Fatigue.  Swollen glands (lymph nodes).  Painful mouth ulcers.  Gum disease.  How is this diagnosed? This condition may be diagnosed based on:  Your medical history.  A physical exam to check for swollen lymph nodes and an enlarged spleen. Your spleen is an organ on the left side of your body that stores white blood cells.  Tests, such as: ? A complete blood count. This blood test counts each type of white cell. ? Bone marrow aspiration. Some bone marrow is removed to be checked under a microscope. ? Lymph node biopsy. Some lymph node tissue is removed to be checked under a microscope. ? Other types of blood tests or imaging tests.  How is this treated? Treatment of leukopenia depends on the cause. Some common treatments include:  Antibiotic medicine to treat bacterial infections.  Stopping medicines that may cause leukopenia.  Medicines to stimulate neutrophil production (hematopoietic growth factors), to treat neutropenia.  Follow these instructions at home:  Take over-the-counter and prescription medicines only as told by your health care provider. This includes supplements and vitamins.  If you were prescribed an antibiotic medicine, take it as told by your health care provider. Do not stop taking the antibiotic even if you start to feel better.  Preventing infection is important if you have leukopenia. To prevent infection: ? Avoid close contact with sick people. ? Wash your hands frequently with soap  and water. If soap and water are not available, use hand sanitizer. ? Do not&nbsp;eat uncooked or undercooked meats. ? Wash fruits and vegetables before eating them. ? Do not eat or drink unpasteurized dairy products. ? Get regular dental care, and maintain good dental  hygiene. You should visit the dentist at least once every 6 months.  Keep all follow-up visits as told by your health care provider. This is important. Contact a health care provider if:  You have chills or a fever.  You have symptoms of an infection. Get help right away if:  You have a fever that lasts for more than 2-3 days.  You have symptoms that last for more than 2-3 days.  You have trouble breathing.  You have chest pain. This information is not intended to replace advice given to you by your health care provider. Make sure you discuss any questions you have with your health care provider. Document Released: 05/28/2013 Document Revised: 04/12/2016 Document Reviewed: 04/12/2016 Elsevier Interactive Patient Education  2018 [employer].   [facility] Content in Foods See the following list for the dietary fiber content of some common foods. High-fiber foods High-fiber foods contain 4 grams or more (4g or more) of fiber per serving. They include:  Artichoke (fresh) - 1 medium has 10.3g of fiber.  Baked beans, plain or vegetarian (canned) -  cup has 5.2g of fiber.  Blackberries or raspberries (fresh) -  cup has 4g of fiber.  Bran cereal -  cup has 8.6g of fiber.  Bulgur (cooked) -  cup has 4g of fiber.  Kidney beans (canned) -  cup has 6.8g of fiber.  Lentils (cooked) -  cup has 7.8g of fiber.  Pear (fresh) - 1 medium has 5.1g of fiber.  Peas (frozen) -  cup has 4.4g of fiber.  Pinto beans (canned) -  cup has 5.5g of fiber.  Pinto beans (dried and cooked) -  cup has 7.7g of fiber.  Potato with skin (baked) - 1 medium has 4.4g of fiber.  Quinoa (cooked) -  cup has 5g of fiber.  Soybeans (canned, frozen, or fresh) -  cup has 5.1g of fiber.  Moderate-fiber foods Moderate-fiber foods contain 1-4 grams (1-4g) of fiber per serving. They include:  Almonds - 1 oz. has 3.5g of fiber.  Apple with skin - 1 medium has 3.3g of fiber.  Applesauce, sweetened -   cup has 1.5g of fiber.  Bagel, plain - one 4-inch (10-cm) bagel has 2g of fiber.  Banana - 1 medium has 3.1g of fiber.  Broccoli (cooked) -  cup has 2.5g of fiber.  Carrots (cooked) -  cup has 2.3g of fiber.  Corn (canned or frozen) -  cup has 2.1g of fiber.  Corn tortilla - one 6-inch (15-cm) tortilla has 1.5g of fiber.  Green beans (canned) -  cup has 2g of fiber.  Instant oatmeal -  cup has about 2g of fiber.  Long-grain brown rice (cooked) - 1 cup has 3.5g of fiber.  Macaroni, enriched (cooked) - 1 cup has 2.5g of fiber.  Melon - 1 cup has 1.4g of fiber.  Multigrain cereal -  cup has about 2-4g of fiber.  Orange - 1 small has 3.1g of fiber.  Potatoes, mashed -  cup has 1.6g of fiber.  Raisins - 1/4 cup has 1.6g of fiber.  Squash -  cup has 2.9g of fiber.  Sunflower seeds -  cup has 1.1g of fiber.  Tomato - 1 medium has 1.5g  of fiber.  Vegetable or soy patty - 1 has 3.4g of fiber.  Whole-wheat bread - 1 slice has 2g of fiber.  Whole-wheat spaghetti -  cup has 3.2g of fiber.  Low-fiber foods Low-fiber foods contain less than 1 gram (less than 1g) of fiber per serving. They include:  Egg - 1 large.  Flour tortilla - one 6-inch (15-cm) tortilla.  Fruit juice -  cup.  Lettuce - 1 cup.  Meat, poultry, or fish - 1 oz.  Milk - 1 cup.  Spinach (raw) - 1 cup.  White bread - 1 slice.  White rice -  cup.  Yogurt -  cup.  Actual amounts of fiber in foods may be different depending on processing. Talk with your dietitian about how much fiber you need in your diet. This information is not intended to replace advice given to you by your health care provider. Make sure you discuss any questions you have with your health care provider. Document Released: 10/09/2006 Document Revised: 10/29/2015 Document Reviewed: 07/16/2015 Elsevier Interactive Patient Education  2018 [employer].

## 2016-11-28 NOTE — Progress Notes (Signed)
Russell Ortiz , [birth year], 46 y.o., male MRN: [id number] Patient Care Team    Relationship Specialty Notifications Start End  Russell Hillock, DO PCP - General Family Medicine  08/17/15     Chief Complaint  Patient presents with  . Results    labs    Subjective:  Abnormal CBC Pt returns to day for follow up on his low WBC, fatigue and fungal infection. In review of old medical records pt has had mildly low neutrophils since 2009.  Repeat labs were indicated given fungal infection and fatigue. Repeat labs collected this past week, again reveal low neutrophils, lower than prior collection (1300--> 1100). Pathology review resulted with absolute neutropenia. Pt denies reoccurrence of infections or fungal infection. He reports he had requested a refill on the fungal cream for a different rash on his ankle (not the message received). His fatigue is improved.   Mixed hyperlipidemiaBMI 28.0-28.9,adult Pt is intolerant of all statins. His diet review today is quite good, with grilled chicken, vegetables, no red meat for many years. His vice is french fries, which he states he bakes when at home and tries not order when out. He asked his grandmother and reports elevated cholesterol does "run in the family". He does take fish oil supplement of ~ 2000-3000 mg a day.    Elevated liver function tests Elevated AST as far back as 2009. He does endorse a beer day during the week and "sometimes a lot more" on the weekends. ID- HIV and Hep B/C have been negative the last few years.  Prior note 08/25/2016: Patient presents for office visit with complaints of fatigue, rash and changes in body odor. He reports approximately one month ago he has noticed increase in fatigue which occurs shortly after awakening in the morning. Patient does endorse snoring and possible report of catching his breath while sleeping by his significant other. He denies falling asleep at long stop lights. He does endorse feeling drowsy  and pulling over to take naps. He is a Administrator. He reports even on his way to work in the morning he feels tired. However he states he feels refreshed when he wakes up.  He also reports a dry rash between his shoulder blades, which is mildly red and itchy.. A rash on the head of his penis. He denies penile discharge. He denies painful lesions. He states that he stopped using deodorant because he had heard certain changes and smells of body odor can mean low oxygen levels, and he thought maybe that's why he was fatigued. He has noticed a body odor, which he is unable to describe. He does state he feels different than before.  Depression screen PHQ 2/9 03/17/2016  Decreased Interest 0  Down, Depressed, Hopeless 0  PHQ - 2 Score 0    Allergies  Allergen Reactions  . Statins Other (See Comments)    mylagia   Social History  Substance Use Topics  . Smoking status: Former Smoker    Quit date: 06/06/1997  . Smokeless tobacco: Never Used  . Alcohol use Yes     Comment: socially   Past Medical History:  Diagnosis Date  . Genital warts   . Hyperlipidemia   . PUD (peptic ulcer disease)    Past Surgical History:  Procedure Laterality Date  . VASECTOMY  2010   Family History  Problem Relation Age of Onset  . Lung cancer Father   . Asthma Brother   . Diabetes Maternal Grandfather   .  Diabetes Maternal Uncle   . Hyperlipidemia Maternal Grandmother    Allergies as of 11/28/2016      Reactions   Statins Other (See Comments)   mylagia      Medication List       Accurate as of 11/28/16  3:33 PM. Always use your most recent med list.          clotrimazole 1 % cream Commonly known as:  LOTRIMIN Apply 1 application topically 2 (two) times daily.   MULTIVITAMIN ADULT Chew Chew by mouth.       No results found for this or any previous visit (from the past 24 hour(s)). No results found.   ROS: Negative, with the exception of above mentioned in HPI   Objective:  BP  132/81 (BP Location: Right Arm, Patient Position: Sitting, Cuff Size: Large)   Pulse 64   Temp 98.4 F (36.9 C)   Resp 20   Wt 219 lb 8 oz (99.6 kg)   SpO2 97%   BMI 28.96 kg/m  Body mass index is 28.96 kg/m. Gen: Afebrile. No acute distress. Very pleasant [name]. Mildly over weight.  HENT: AT. [place].MMM, no mouth lesions.  Eyes:Pupils Equal Round Reactive to light, Extraocular movements intact,  Conjunctiva without redness, discharge or icterus. Neck/lymp/endocrine: Supple,no lymphadenopathy, no thyromegaly CV: RRR, noedema, Chest: CTAB, no wheeze or crackles Abd: Soft. NTND. BS present. no Masses palpated.  Skin: no rashes, purpura or petechiae.  Neuro:  Normal gait. PERLA. EOMi. Alert. Oriented.   Assessment/Plan: Russell Ortiz is a 46 y.o. 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 His levels have been just below normal since at least 2009. There is more of a concern if levels continue to drop, especially below 1000, and recurrent infections. He states the fatigue and rash both improved, which were present at first collection 3 months ago. However neutrophils continue to slowly decrease (currenlty ~1100). Pt aware and will continue to monitor in 4 months with CBC-diff.  - he does have mildly eleavted AST--> decrease Etoh consumption.  - AVS education on neutropenia provided.  - fatigue and rash improved. No continued/recurrent  infections. He requested a refill for a different rash via echart. - He tested negative for hepatitis B/C in 2013 and negative HIV 2016 - Hemoglobin A1c--> 5.6 - TSH--> normal 08/2016  Mixed hyperlipidemia/BMI > 28 - PREP program information provided to him for exercise/diet counseling.  - by review of his diet today, he really eats a good diet (except french fries--> he loves these).  - can not tolerate statins. Discussed looking into red  yeast rice or krill oil. - pt reports use of fish oil 3000 mg QD --> increase to 4000 mg QD.  - Increase fiber in diet, AVS provided on high fiber foods.   Elevated liver function tests - AST has been chronically elevated since 2009, cut back on alcohol (mostly increased use on weekends currently).     Reviewed expectations re: course of current medical issues.  Discussed self-management of symptoms.  Outlined signs and symptoms indicating need for more acute intervention.  Patient verbalized understanding and all questions were answered.  Patient received an After-Visit Summary.  F/U 4 months with CBC repeat and provider appt (CPE is ok if time) electronically signed by:  Howard Pouch, DO  [place]

## 2016-11-28 NOTE — Telephone Encounter (Signed)
Pt had labs collected and they have resulted. However, he was suppose to follow up with that lab 2 weeks after his appt, which was 3 months ago. He has also asked for refills on the cream for his rash during that time.  I would like him to make an appt to discuss abnormal labs and his rash.  Briefly his WBC count has slowly declined over the last year. Decreased WBC with his repeat fungal infections warrants potential  further investigation and discussion.  His cholesterol is also rather elevated and one if his liver enzymes continues to elevate.  He should make an appt to discuss

## 2016-11-29 ENCOUNTER — Ambulatory Visit: Payer: Self-pay | Admitting: Family Medicine

## 2017-04-12 ENCOUNTER — Ambulatory Visit (INDEPENDENT_AMBULATORY_CARE_PROVIDER_SITE_OTHER): Payer: Managed Care, Other (non HMO) | Admitting: Family Medicine

## 2017-04-12 ENCOUNTER — Encounter: Payer: Self-pay | Admitting: Family Medicine

## 2017-04-12 VITALS — BP 125/85 | HR 68 | Temp 98.2°F | Resp 20 | Ht 73.0 in | Wt 211.5 lb

## 2017-04-12 DIAGNOSIS — Z0000 Encounter for general adult medical examination without abnormal findings: Secondary | ICD-10-CM

## 2017-04-12 DIAGNOSIS — R7309 Other abnormal glucose: Secondary | ICD-10-CM

## 2017-04-12 DIAGNOSIS — R945 Abnormal results of liver function studies: Secondary | ICD-10-CM

## 2017-04-12 DIAGNOSIS — R7989 Other specified abnormal findings of blood chemistry: Secondary | ICD-10-CM

## 2017-04-12 DIAGNOSIS — Z125 Encounter for screening for malignant neoplasm of prostate: Secondary | ICD-10-CM

## 2017-04-12 DIAGNOSIS — E7849 Other hyperlipidemia: Secondary | ICD-10-CM

## 2017-04-12 DIAGNOSIS — D709 Neutropenia, unspecified: Secondary | ICD-10-CM | POA: Diagnosis not present

## 2017-04-12 DIAGNOSIS — E663 Overweight: Secondary | ICD-10-CM

## 2017-04-12 LAB — CBC WITH DIFFERENTIAL/PLATELET
BASOS ABS: 0 10*3/uL (ref 0.0–0.1)
Basophils Relative: 1.1 % (ref 0.0–3.0)
Eosinophils Absolute: 0.6 10*3/uL (ref 0.0–0.7)
Eosinophils Relative: 15 % — ABNORMAL HIGH (ref 0.0–5.0)
HEMATOCRIT: 47.4 % (ref 39.0–52.0)
HEMOGLOBIN: 15.3 g/dL (ref 13.0–17.0)
LYMPHS PCT: 42.2 % (ref 12.0–46.0)
Lymphs Abs: 1.8 10*3/uL (ref 0.7–4.0)
MCHC: 32.3 g/dL (ref 30.0–36.0)
MCV: 85.9 fl (ref 78.0–100.0)
Monocytes Absolute: 0.2 10*3/uL (ref 0.1–1.0)
Monocytes Relative: 5.8 % (ref 3.0–12.0)
Neutro Abs: 1.5 10*3/uL (ref 1.4–7.7)
Neutrophils Relative %: 35.9 % — ABNORMAL LOW (ref 43.0–77.0)
Platelets: 178 10*3/uL (ref 150.0–400.0)
RBC: 5.52 Mil/uL (ref 4.22–5.81)
RDW: 14.4 % (ref 11.5–15.5)
WBC: 4.3 10*3/uL (ref 4.0–10.5)

## 2017-04-12 LAB — COMPREHENSIVE METABOLIC PANEL
ALK PHOS: 56 U/L (ref 39–117)
ALT: 35 U/L (ref 0–53)
AST: 58 U/L — ABNORMAL HIGH (ref 0–37)
Albumin: 4.6 g/dL (ref 3.5–5.2)
BUN: 11 mg/dL (ref 6–23)
CALCIUM: 10.1 mg/dL (ref 8.4–10.5)
CO2: 29 mEq/L (ref 19–32)
Chloride: 105 mEq/L (ref 96–112)
Creatinine, Ser: 1.03 mg/dL (ref 0.40–1.50)
GFR: 100.42 mL/min (ref >60.00)
Glucose, Bld: 91 mg/dL (ref 70–99)
Potassium: 4.3 mEq/L (ref 3.5–5.1)
Sodium: 140 mEq/L (ref 135–145)
TOTAL PROTEIN: 7.7 g/dL (ref 6.0–8.3)
Total Bilirubin: 0.8 mg/dL (ref 0.2–1.2)

## 2017-04-12 LAB — LIPID PANEL
Cholesterol: 267 mg/dL — ABNORMAL HIGH (ref 0–200)
HDL: 52.4 mg/dL (ref >39.00)
LDL Cholesterol: 197 mg/dL — ABNORMAL HIGH (ref 0–99)
NONHDL: 214.2
TRIGLYCERIDES: 86 mg/dL (ref 0.0–149.0)
Total CHOL/HDL Ratio: 5
VLDL: 17.2 mg/dL (ref 0.0–40.0)

## 2017-04-12 LAB — HEMOGLOBIN A1C: Hgb A1c MFr Bld: 6 % (ref 4.6–6.5)

## 2017-04-12 LAB — PSA: PSA: 0.28 ng/mL (ref 0.10–4.00)

## 2017-04-12 NOTE — Patient Instructions (Addendum)
B-complex and zinc vitamins.  We will call you with your results once available.  Continue Krill oil and fish oil.        Health Maintenance, Male A healthy lifestyle and preventive care is important for your health and wellness. Ask your health care provider about what schedule of regular examinations is right for you. What should I know about weight and diet? Eat a Healthy Diet  Eat plenty of vegetables, fruits, whole grains, low-fat dairy products, and lean protein.  Do not eat a lot of foods high in solid fats, added sugars, or salt.  Maintain a Healthy Weight Regular exercise can help you achieve or maintain a healthy weight. You should:  Do at least 150 minutes of exercise each week. The exercise should increase your heart rate and make you sweat (moderate-intensity exercise).  Do strength-training exercises at least twice a week.  Watch Your Levels of Cholesterol and Blood Lipids  Have your blood tested for lipids and cholesterol every 5 years starting at 45 years of age. If you are at high risk for heart disease, you should start having your blood tested when you are 45 years old. You may need to have your cholesterol levels checked more often if: ? Your lipid or cholesterol levels are high. ? You are older than 45 years of age. ? You are at high risk for heart disease.  What should I know about cancer screening? Many types of cancers can be detected early and may often be prevented. Lung Cancer  You should be screened every year for lung cancer if: ? You are a current smoker who has smoked for at least 30 years. ? You are a former smoker who has quit within the past 15 years.  Talk to your health care provider about your screening options, when you should start screening, and how often you should be screened.  Colorectal Cancer  Routine colorectal cancer screening usually begins at 45 years of age and should be repeated every 5-10 years until you are 45 years old.  You may need to be screened more often if early forms of precancerous polyps or small growths are found. Your health care provider may recommend screening at an earlier age if you have risk factors for colon cancer.  Your health care provider may recommend using home test kits to check for hidden blood in the stool.  A small camera at the end of a tube can be used to examine your colon (sigmoidoscopy or colonoscopy). This checks for the earliest forms of colorectal cancer.  Prostate and Testicular Cancer  Depending on your age and overall health, your health care provider may do certain tests to screen for prostate and testicular cancer.  Talk to your health care provider about any symptoms or concerns you have about testicular or prostate cancer.  Skin Cancer  Check your skin from head to toe regularly.  Tell your health care provider about any new moles or changes in moles, especially if: ? There is a change in a mole's size, shape, or color. ? You have a mole that is larger than a pencil eraser.  Always use sunscreen. Apply sunscreen liberally and repeat throughout the day.  Protect yourself by wearing long sleeves, pants, a wide-brimmed hat, and sunglasses when outside.  What should I know about heart disease, diabetes, and high blood pressure?  If you are 74-44 years of age, have your blood pressure checked every 3-5 years. If you are 56 years of age  or older, have your blood pressure checked every year. You should have your blood pressure measured twice-once when you are at a hospital or clinic, and once when you are not at a hospital or clinic. Record the average of the two measurements. To check your blood pressure when you are not at a hospital or clinic, you can use: ? An automated blood pressure machine at a pharmacy. ? A home blood pressure monitor.  Talk to your health care provider about your target blood pressure.  If you are between 67-14 years old, ask your health  care provider if you should take aspirin to prevent heart disease.  Have regular diabetes screenings by checking your fasting blood sugar level. ? If you are at a normal weight and have a low risk for diabetes, have this test once every three years after the age of 58. ? If you are overweight and have a high risk for diabetes, consider being tested at a younger age or more often.  A one-time screening for abdominal aortic aneurysm (AAA) by ultrasound is recommended for men aged 61-75 years who are current or former smokers. What should I know about preventing infection? Hepatitis B If you have a higher risk for hepatitis B, you should be screened for this virus. Talk with your health care provider to find out if you are at risk for hepatitis B infection. Hepatitis C Blood testing is recommended for:  Everyone born from [birth year] through 1964.  Anyone with known risk factors for hepatitis C.  Sexually Transmitted Diseases (STDs)  You should be screened each year for STDs including gonorrhea and chlamydia if: ? You are sexually active and are younger than 45 years of age. ? You are older than 45 years of age and your health care provider tells you that you are at risk for this type of infection. ? Your sexual activity has changed since you were last screened and you are at an increased risk for chlamydia or gonorrhea. Ask your health care provider if you are at risk.  Talk with your health care provider about whether you are at high risk of being infected with HIV. Your health care provider may recommend a prescription medicine to help prevent HIV infection.  What else can I do?  Schedule regular health, dental, and eye exams.  Stay current with your vaccines (immunizations).  Do not use any tobacco products, such as cigarettes, chewing tobacco, and e-cigarettes. If you need help quitting, ask your health care provider.  Limit alcohol intake to no more than 2 drinks per day. One drink  equals 12 ounces of beer, 5 ounces of wine, or 1 ounces of hard liquor.  Do not use street drugs.  Do not share needles.  Ask your health care provider for help if you need support or information about quitting drugs.  Tell your health care provider if you often feel depressed.  Tell your health care provider if you have ever been abused or do not feel safe at home. This information is not intended to replace advice given to you by your health care provider. Make sure you discuss any questions you have with your health care provider. Document Released: 11/19/2007 Document Revised: 01/20/2016 Document Reviewed: 02/24/2015 Elsevier Interactive Patient Education  [employer].

## 2017-04-12 NOTE — Progress Notes (Signed)
Patient ID: Russell Ortiz, male  DOB: Aug 22, 1970, 46 y.o.   MRN: [id number] Patient Care Team    Relationship Specialty Notifications Start End  Ma Hillock, DO PCP - General Family Medicine  08/17/15     Chief Complaint  Patient presents with  . Annual Exam    Subjective:  Russell Ortiz is a 46 y.o. male present for CPE. All past medical history, surgical history, allergies, family history, immunizations, medications and social history were obtained and updated in the electronic medical record today. All recent labs, ED visits and hospitalizations within the last year were reviewed.  Health maintenance:  Colonoscopy: Never consider early screening for African-American male with discuss starting screening next year.  Immunizations:  tdap UTD 2009, influenza declined today Infectious disease screening: HIV completed 2016 PSA:  Lab Results  Component Value Date   PSA 0.28 04/12/2017   PSA 0.21 03/17/2016   PSA 0.24 03/12/2015  , pt was counseled on prostate cancer screenings.  Assistive device: None Oxygen use: None* Patient has a Dental home. Hospitalizations/ED visits: None  Depression screen [facility] 2/9 04/12/2017 03/17/2016  Decreased Interest 0 0  Down, Depressed, Hopeless 0 0  PHQ - 2 Score 0 0   No flowsheet data found.   Current Exercise Habits: Structured exercise class, Type of exercise: strength training/weights;treadmill, Time (Minutes): 60, Frequency (Times/Week): 3, Weekly Exercise (Minutes/Week): 180, Intensity: Moderate   Fall Risk  03/17/2016  Falls in the past year? No      Immunization History  Administered Date(s) Administered  . Influenza Split 03/29/2012  . Influenza,inj,Quad PF,6+ Mos 03/17/2016  . Td 08/15/2007     Past Medical History:  Diagnosis Date  . Genital warts   . Hyperlipidemia   . PUD (peptic ulcer disease)    Allergies  Allergen Reactions  . Statins Other (See Comments)    mylagia   Past Surgical History:    Procedure Laterality Date  . VASECTOMY  2010   Family History  Problem Relation Age of Onset  . Lung cancer Father   . Asthma Brother   . Diabetes Maternal Grandfather   . Diabetes Maternal Uncle   . Hyperlipidemia Maternal Grandmother    Social History   Socioeconomic History  . Marital status: Married    Spouse name: Not on file  . Number of children: 1  . Years of education: 40  . Highest education level: Not on file  Social Needs  . Financial resource strain: Not on file  . Food insecurity - worry: Not on file  . Food insecurity - inability: Not on file  . Transportation needs - medical: Not on file  . Transportation needs - non-medical: Not on file  Occupational History  . Occupation: truck Geophysicist/field seismologist  Tobacco Use  . Smoking status: Former Smoker    Last attempt to quit: 06/06/1997    Years since quitting: 19.8  . Smokeless tobacco: Never Used  Substance and Sexual Activity  . Alcohol use: Yes    Comment: socially  . Drug use: No  . Sexual activity: Yes    Partners: Female    Comment: married  Other Topics Concern  . Not on file  Social History Narrative   Married. 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    Wears seat belt. Exercises 3x or greater a week.    Former Smoker. Quit 1999, Occasional ETOH, no recreational drugs.    Drinks  caffeine beverages. Uses herbal remedies. Takes a multivitamin.    Smoker alarm in the home.    Feels safe in his relationships.    Allergies as of 04/12/2017      Reactions   Statins Other (See Comments)   mylagia      Medication List        Accurate as of 04/12/17 11:59 PM. Always use your most recent med list.          MULTIVITAMIN ADULT Chew Chew by mouth.      All past medical history, surgical history, allergies, family history, immunizations andmedications were updated in the EMR today and reviewed under the history and medication portions of their EMR.     Recent  Results (from the past 2160 hour(s))  CBC w/Diff     Status: Abnormal   Collection Time: 04/12/17  8:57 AM  Result Value Ref Range   WBC 4.3 4.0 - 10.5 K/uL   RBC 5.52 4.22 - 5.81 Mil/uL   Hemoglobin 15.3 13.0 - 17.0 g/dL   HCT 47.4 39.0 - 52.0 %   MCV 85.9 78.0 - 100.0 fl   MCHC 32.3 30.0 - 36.0 g/dL   RDW 14.4 11.5 - 15.5 %   Platelets 178.0 150.0 - 400.0 K/uL   Neutrophils Relative % 35.9 (L) 43.0 - 77.0 %   Lymphocytes Relative 42.2 12.0 - 46.0 %   Monocytes Relative 5.8 3.0 - 12.0 %   Eosinophils Relative 15.0 (H) 0.0 - 5.0 %   Basophils Relative 1.1 0.0 - 3.0 %   Neutro Abs 1.5 1.4 - 7.7 K/uL   Lymphs Abs 1.8 0.7 - 4.0 K/uL   Monocytes Absolute 0.2 0.1 - 1.0 K/uL   Eosinophils Absolute 0.6 0.0 - 0.7 K/uL   Basophils Absolute 0.0 0.0 - 0.1 K/uL  Comp Met (CMET)     Status: Abnormal   Collection Time: 04/12/17  8:57 AM  Result Value Ref Range   Sodium 140 135 - 145 mEq/L   Potassium 4.3 3.5 - 5.1 mEq/L   Chloride 105 96 - 112 mEq/L   CO2 29 19 - 32 mEq/L   Glucose, Bld 91 70 - 99 mg/dL   BUN 11 6 - 23 mg/dL   Creatinine, Ser 1.03 0.40 - 1.50 mg/dL   Total Bilirubin 0.8 0.2 - 1.2 mg/dL   Alkaline Phosphatase 56 39 - 117 U/L   AST 58 (H) 0 - 37 U/L   ALT 35 0 - 53 U/L   Total Protein 7.7 6.0 - 8.3 g/dL   Albumin 4.6 3.5 - 5.2 g/dL   Calcium 10.1 8.4 - 10.5 mg/dL   GFR 100.42 >60.00 mL/min  Lipid panel     Status: Abnormal   Collection Time: 04/12/17  8:57 AM  Result Value Ref Range   Cholesterol 267 (H) 0 - 200 mg/dL    Comment: ATP III Classification       Desirable:  < 200 mg/dL               Borderline High:  200 - 239 mg/dL          High:  > = 240 mg/dL   Triglycerides 86.0 0.0 - 149.0 mg/dL    Comment: Normal:  <150 mg/dLBorderline High:  150 - 199 mg/dL   HDL 52.40 >39.00 mg/dL   VLDL 17.2 0.0 - 40.0 mg/dL   LDL Cholesterol 197 (H) 0 - 99 mg/dL   Total CHOL/HDL Ratio 5  Comment:                Men          Women1/2 Average Risk     3.4          3.3Average  Risk          5.0          4.42X Average Risk          9.6          7.13X Average Risk          15.0          11.0                       NonHDL 214.20     Comment: NOTE:  Non-HDL goal should be 30 mg/dL higher than patient's LDL goal (i.e. LDL goal of < 70 mg/dL, would have non-HDL goal of < 100 mg/dL)  HgB A1c     Status: None   Collection Time: 04/12/17  8:57 AM  Result Value Ref Range   Hgb A1c MFr Bld 6.0 4.6 - 6.5 %    Comment: Glycemic Control Guidelines for People with Diabetes:Non Diabetic:  <6%Goal of Therapy: <7%Additional Action Suggested:  >8%   PSA     Status: None   Collection Time: 04/12/17  8:57 AM  Result Value Ref Range   PSA 0.28 0.10 - 4.00 ng/mL    Comment: Test performed using Access Hybritech PSA Assay, a parmagnetic partical, chemiluminecent immunoassay.    Dg Chest 2 View  Result Date: 04/08/2016 CLINICAL DATA:  Several day history of chest pain. Former smoker. History of hyperlipidemia. EXAM: CHEST  2 VIEW COMPARISON:  PA and lateral chest x-ray of July 15, 2004 FINDINGS: The lungs are well-expanded. The interstitial markings are coarse. There is no alveolar infiltrate. There is no pleural effusion. The heart and pulmonary vascularity are normal. The bony thorax exhibits no acute abnormality. IMPRESSION: Mild interstitial prominence consistent with the smoking history. No pneumonia, CHF, nor other acute cardiopulmonary abnormality. Electronically Signed   By: David  Martinique M.D.   On: 04/08/2016 08:16     ROS: 14 pt review of systems performed and negative (unless mentioned in an HPI)  Objective: BP 125/85 (BP Location: Left Arm, Patient Position: Sitting, Cuff Size: Large)   Pulse 68   Temp 98.2 F (36.8 C)   Resp 20   Ht 6' 1"  (1.854 m)   Wt 211 lb 8 oz (95.9 kg)   SpO2 98%   BMI 27.90 kg/m  Gen: Afebrile. No acute distress. Nontoxic in appearance, well-developed, well-nourished,  pleasant African-American male. HENT: AT. [place]. Bilateral TM visualized  and normal in appearance, normal external auditory canal. MMM, no oral lesions, adequate dentition. Bilateral nares within normal limits. Throat without erythema, ulcerations or exudates. No Cough on exam, no hoarseness on exam. Eyes:Pupils Equal Round Reactive to light, Extraocular movements intact,  Conjunctiva without redness, discharge or icterus. Neck/lymp/endocrine: Supple, no lymphadenopathy, no thyromegaly CV: RRR no murmur, no edema, +2/4 P posterior tibialis pulses. No carotid bruits. No JVD. Chest: CTAB, no wheeze, rhonchi or crackles. Normal Respiratory effort. Good Air movement. Abd: Soft. Overweight. NTND. BS present. No Masses palpated. No hepatosplenomegaly. No rebound tenderness or guarding. Skin: No rashes, purpura or petechiae. Warm and well-perfused. Skin intact. Neuro/Msk:  Normal gait. PERLA. EOMi. Alert. Oriented x3.  Cranial nerves II through XII intact. Muscle strength 5/5 upper/lower extremity. DTRs  equal bilaterally. Psych: Normal affect, dress and demeanor. Normal speech. Normal thought content and judgment.  No exam data present  Assessment/plan: Russell Ortiz is a 46 y.o. male present for CPE Elevated hemoglobin A1c - Diet and exercise counseling - HgB A1c Other hyperlipidemia Patient has been unable to tolerate any statin multiple have been tried. Discussed with him today if elevated still would try a different type of medication like WelChol. Patient is taking krill oil and fish oil. - CBC w/Diff - Comp Met (CMET) - Lipid panel Prostate cancer screening - PSA Elevated liver function tests Chronic. Stable. Continue to monitor. HIV negative. Elevated cholesterol. Overweight (BMI 25.0-29.9) Diet and exercise greater than 150 minutes a week. Neutropenia, unspecified type ([place]) - Chronic and stable, likely normal for him, African-American male. - continue to monitor. Encounter for preventative adult health care examination Patient was encouraged to exercise  greater than 150 minutes a week. Patient was encouraged to choose a diet filled with fresh fruits and vegetables, and lean meats. AVS provided to patient today for education/recommendation on gender specific health and safety maintenance. Colonoscopy: Never. consider early screening for African-American male. discuss starting screening next year.  Immunizations:  tdap UTD 2009, influenza declined today Infectious disease screening: HIV completed 2016 PSA: Collected today.  Return in about 1 year (around 04/12/2018) for CPE.  Note is dictated utilizing voice recognition software. Although note has been proof read prior to signing, occasional typographical errors still can be missed. If any questions arise, please do not hesitate to call for verification.  Electronically signed by: Howard Pouch, DO [place]

## 2017-04-14 ENCOUNTER — Telehealth: Payer: Self-pay | Admitting: Family Medicine

## 2017-04-14 ENCOUNTER — Encounter: Payer: Self-pay | Admitting: Family Medicine

## 2017-04-14 MED ORDER — COLESEVELAM HCL 625 MG PO TABS: 1250.0000 mg | ORAL_TABLET | Freq: Two times a day (BID) | ORAL | 5 refills | Status: DC

## 2017-04-14 NOTE — Telephone Encounter (Signed)
Please call patient: All of his labs look stable with the exception of his cholesterol and his diabetes screen is mildly elevated again is not diabetic. Patient has been unable to tolerate any statins in the past. Would like him to try a medication called WelChol which is not a statin, does not have properties anything like a statin. he will not get myalgias from this medication. This medication will help both with lowering his A1c and his cholesterol. I have called this medication in for him. I would like to follow-up with him in 4 months after starting medication to recheck his cholesterol.

## 2017-04-17 NOTE — Telephone Encounter (Signed)
Spoke with patient reviewed lab results and instructions. Patient verbalized understanding. 

## 2017-04-21 ENCOUNTER — Telehealth: Payer: Self-pay | Admitting: Family Medicine

## 2017-04-21 MED ORDER — VALACYCLOVIR HCL 1 G PO TABS: ORAL_TABLET | ORAL | 0 refills | Status: DC

## 2017-04-21 NOTE — Telephone Encounter (Signed)
I will prescribe valtrex once for lip bump, if it does not go away he needs to be seen. This may or may not be a cold sore and if it is not then he may need to see a dermatologist. Please send to his pharmacy

## 2017-04-21 NOTE — Telephone Encounter (Signed)
Patient states he was seen in office approximately a week ago for cpe.  During visit he asked pcp about bump on his lip. PCP recommended he try otc medication.  Patient states he has tried otc medication but it has not help.  He reports bump keeps getting bigger.  He wants to know what he should do about it.

## 2017-04-21 NOTE — Telephone Encounter (Signed)
Spoke with patient reviewed information and instructions. 

## 2017-04-24 ENCOUNTER — Other Ambulatory Visit: Payer: Self-pay | Admitting: *Deleted

## 2017-04-24 MED ORDER — VALACYCLOVIR HCL 1 G PO TABS: ORAL_TABLET | ORAL | 0 refills | Status: DC

## 2017-09-14 ENCOUNTER — Ambulatory Visit (INDEPENDENT_AMBULATORY_CARE_PROVIDER_SITE_OTHER): Payer: Managed Care, Other (non HMO) | Admitting: Family Medicine

## 2017-09-14 ENCOUNTER — Encounter: Payer: Self-pay | Admitting: Family Medicine

## 2017-09-14 VITALS — BP 130/89 | HR 81 | Temp 98.1°F | Resp 18 | Wt 221.0 lb

## 2017-09-14 DIAGNOSIS — J302 Other seasonal allergic rhinitis: Secondary | ICD-10-CM | POA: Diagnosis not present

## 2017-09-14 DIAGNOSIS — J0110 Acute frontal sinusitis, unspecified: Secondary | ICD-10-CM | POA: Diagnosis not present

## 2017-09-14 MED ORDER — DOXYCYCLINE HYCLATE 100 MG PO TABS: 100.0000 mg | ORAL_TABLET | Freq: Two times a day (BID) | ORAL | 0 refills | Status: DC

## 2017-09-14 MED ORDER — BENZONATATE 200 MG PO CAPS: 200.0000 mg | ORAL_CAPSULE | Freq: Two times a day (BID) | ORAL | 0 refills | Status: DC | PRN

## 2017-09-14 MED ORDER — PREDNISONE 20 MG PO TABS: ORAL_TABLET | ORAL | 0 refills | Status: DC

## 2017-09-14 NOTE — Progress Notes (Signed)
Russell Ortiz , [birth year], 47 y.o., male MRN: [id number] Patient Care Team    Relationship Specialty Notifications Start End  Ma Hillock, DO PCP - General Family Medicine  08/17/15     Chief Complaint  Patient presents with  . URI    Pt c/o of facial pressure, headache, earpain, cough and congestion denies fever/chills X 2days.Use nasal spray andTheraflu for temporal relief     Subjective: Pt presents for an OV with complaints of cough of 2 days duration.  Associated symptoms include facial pressure, headache, ear pain and nasal congestion, feverish in the evening. He denies nausea, vomit, diarrhea or abd pain. Pt has tried theraflu and loratadine to ease their symptoms.   Depression screen [facility] 2/9 09/14/2017 04/12/2017 03/17/2016  Decreased Interest 0 0 0  Down, Depressed, Hopeless 0 0 0  PHQ - 2 Score 0 0 0    Allergies  Allergen Reactions  . Statins Other (See Comments)    mylagia   Social History   Tobacco Use  . Smoking status: Former Smoker    Last attempt to quit: 06/06/1997    Years since quitting: 20.2  . Smokeless tobacco: Never Used  Substance Use Topics  . Alcohol use: Yes    Comment: socially   Past Medical History:  Diagnosis Date  . Genital warts   . Hyperlipidemia   . PUD (peptic ulcer disease)    Past Surgical History:  Procedure Laterality Date  . VASECTOMY  2010   Family History  Problem Relation Age of Onset  . Lung cancer Father   . Asthma Brother   . Diabetes Maternal Grandfather   . Diabetes Maternal Uncle   . Hyperlipidemia Maternal Grandmother    Allergies as of 09/14/2017      Reactions   Statins Other (See Comments)   mylagia      Medication List        Accurate as of 09/14/17  8:28 AM. Always use your most recent med list.          benzonatate 200 MG capsule Commonly known as:  TESSALON Take 1 capsule (200 mg total) by mouth 2 (two) times daily as needed for cough.   doxycycline 100 MG tablet Commonly known as:   VIBRA-TABS Take 1 tablet (100 mg total) by mouth 2 (two) times daily.   MULTIVITAMIN ADULT Chew Chew by mouth.   predniSONE 20 MG tablet Commonly known as:  DELTASONE 60 mg x3d, 40 mg x3d, 20 mg x2d, 10 mg x2d   valACYclovir 1000 MG tablet Commonly known as:  VALTREX 2 G PO, repeat dose once in 12 hours.       All past medical history, surgical history, allergies, family history, immunizations andmedications were updated in the EMR today and reviewed under the history and medication portions of their EMR.     ROS: Negative, with the exception of above mentioned in HPI   Objective:  BP 130/89   Pulse 81   Temp 98.1 F (36.7 C)   Resp 18   Wt 221 lb (100.2 kg)   BMI 29.16 kg/m  Body mass index is 29.16 kg/m. Gen: Afebrile. No acute distress. Nontoxic in appearance, well developed, well nourished. Pleasant [name].  HENT: AT. [place]. Bilateral TM visualized with bilateral fullness. MMM, no oral lesions. Bilateral nares with erythema, swelling and drianage. Throat without erythema or exudates. PND, hoarseness, mild cough present. TTP frontal left max sinus. Eyes:Pupils Equal Round Reactive to light, Extraocular movements  intact,  Conjunctiva without redness, discharge or icterus. Neck/lymp/endocrine: Supple,no lymphadenopathy CV: RRR  Chest: CTAB, no wheeze or crackles. Good air movement, normal resp effort.  Abd: Soft. NTND. BS present.  Neuro: Normal gait. PERLA. EOMi. Alert. Oriented x3  No exam data present No results found. No results found for this or any previous visit (from the past 24 hour(s)).  Assessment/Plan: Russell Ortiz is a 47 y.o. male present for OV for  Acute non-recurrent frontal sinusitis/seasonal allergies Rest, hydrate.  + flonase, mucinex (DM if cough), nettie pot or nasal saline.  Start xyzal or Zyrtec nightly.  Tessalon perles for cough Prednisone taper Doxycyline prescribed, take until completed.  If cough present it can last up to 6-8 weeks.  F/U 2  weeks of not improved.     Reviewed expectations re: course of current medical issues.  Discussed self-management of symptoms.  Outlined signs and symptoms indicating need for more acute intervention.  Patient verbalized understanding and all questions were answered.  Patient received an After-Visit Summary.    No orders of the defined types were placed in this encounter.    Note is dictated utilizing voice recognition software. Although note has been proof read prior to signing, occasional typographical errors still can be missed. If any questions arise, please do not hesitate to call for verification.   electronically signed by:  Howard Pouch, DO  [place]

## 2017-09-14 NOTE — Patient Instructions (Signed)
Rest, hydrate.  + flonase, mucinex (DM if cough), nettie pot or nasal saline.  Start xyzal or Zyrtec nightly.  Tessalon perles for cough Prednisone taper Doxycyline prescribed, take until completed.  If cough present it can last up to 6-8 weeks.  F/U 2 weeks of not improved.    Sinusitis, Adult Sinusitis is soreness and inflammation of your sinuses. Sinuses are hollow spaces in the bones around your face. They are located:  Around your eyes.  In the middle of your forehead.  Behind your nose.  In your cheekbones.  Your sinuses and nasal passages are lined with a stringy fluid (mucus). Mucus normally drains out of your sinuses. When your nasal tissues get inflamed or swollen, the mucus can get trapped or blocked so air cannot flow through your sinuses. This lets bacteria, viruses, and funguses grow, and that leads to infection. Follow these instructions at home: Medicines  Take, use, or apply over-the-counter and prescription medicines only as told by your doctor. These may include nasal sprays.  If you were prescribed an antibiotic medicine, take it as told by your doctor. Do not stop taking the antibiotic even if you start to feel better. Hydrate and Humidify  Drink enough water to keep your pee (urine) clear or pale yellow.  Use a cool mist humidifier to keep the humidity level in your home above 50%.  Breathe in steam for 10-15 minutes, 3-4 times a day or as told by your doctor. You can do this in the bathroom while a hot shower is running.  Try not to spend time in cool or dry air. Rest  Rest as much as possible.  Sleep with your head raised (elevated).  Make sure to get enough sleep each night. General instructions  Put a warm, moist washcloth on your face 3-4 times a day or as told by your doctor. This will help with discomfort.  Wash your hands often with soap and water. If there is no soap and water, use hand sanitizer.  Do not smoke. Avoid being around  people who are smoking (secondhand smoke).  Keep all follow-up visits as told by your doctor. This is important. Contact a doctor if:  You have a fever.  Your symptoms get worse.  Your symptoms do not get better within 10 days. Get help right away if:  You have a very bad headache.  You cannot stop throwing up (vomiting).  You have pain or swelling around your face or eyes.  You have trouble seeing.  You feel confused.  Your neck is stiff.  You have trouble breathing. This information is not intended to replace advice given to you by your health care provider. Make sure you discuss any questions you have with your health care provider. Document Released: 11/09/2007 Document Revised: 01/17/2016 Document Reviewed: 03/18/2015 Elsevier Interactive Patient Education  [employer].

## 2017-09-15 ENCOUNTER — Telehealth: Payer: Self-pay | Admitting: *Deleted

## 2017-09-15 NOTE — Telephone Encounter (Signed)
Copied from [place] [id number]. Topic: Inquiry >> Sep 15, 2017 12:21 PM Rutherford Nail, [place] wrote: Reason for CRM: Patient called stating he needs a work note for yesterday (09/14/17) and today (09/15/17). He states he forgot to ask for one yesterday at his appointment. States he can pick it up at the office today if someone will just give him a call once it's ready. Please advise. CB#: [phone]  Notified patient work note ready for pick up

## 2017-12-08 ENCOUNTER — Other Ambulatory Visit: Payer: Self-pay

## 2017-12-08 ENCOUNTER — Encounter: Payer: Self-pay | Admitting: Physician Assistant

## 2017-12-08 ENCOUNTER — Ambulatory Visit (INDEPENDENT_AMBULATORY_CARE_PROVIDER_SITE_OTHER): Payer: Managed Care, Other (non HMO) | Admitting: Physician Assistant

## 2017-12-08 VITALS — BP 122/86 | HR 59 | Temp 97.5°F | Resp 16 | Ht 73.0 in | Wt 223.0 lb

## 2017-12-08 DIAGNOSIS — L309 Dermatitis, unspecified: Secondary | ICD-10-CM | POA: Diagnosis not present

## 2017-12-08 MED ORDER — PREDNISONE 10 MG PO TABS: ORAL_TABLET | ORAL | 0 refills | Status: AC

## 2017-12-08 MED ORDER — TRIAMCINOLONE ACETONIDE 0.5 % EX OINT: 1.0000 "application " | TOPICAL_OINTMENT | Freq: Two times a day (BID) | CUTANEOUS | 0 refills | Status: DC

## 2017-12-08 NOTE — Patient Instructions (Signed)
Please take the steroid taper as directed. Use the Kenalog cream twice daily to these areas. Limit heat exposure when possible. Apply OTC Sarna lotion to the area to help with itch.  Make sure to avoid use of soaps/lotions that are scented or died as these can irritate the skin further. Cetaphil is a great options.  Giving recurring symptoms, I am setting you up with Dermatology for further assessment.

## 2017-12-08 NOTE — Progress Notes (Signed)
Patient presents to clinic today c/o pruritic rash of flexural surfaces of arms  Bilaterally x 1 month. Denies fever, chills, malaise. Denies any nausea or vomiting. Denies change to soaps, lotion, detergents. Denies new pet or contact with similar symptoms. Patient states sunlight and heat worsen the rash. Cool showers, etc help with the rash somewhat. Patient notes being seen at [facility] at the end of May for this. Was given hydroxyzine that he could not tolerate and a small course of prednisone which did help somewhat.  Past Medical History:  Diagnosis Date  . Genital warts   . Hyperlipidemia   . PUD (peptic ulcer disease)     Current Outpatient Medications on File Prior to Visit  Medication Sig Dispense Refill  . colesevelam (WELCHOL) 625 MG tablet Take 1,250 mg by mouth 2 (two) times daily with a meal.     . Multiple Vitamins-Minerals (MULTIVITAMIN ADULT) CHEW Chew by mouth.     No current facility-administered medications on file prior to visit.     Allergies  Allergen Reactions  . Statins Other (See Comments)    mylagia    Family History  Problem Relation Age of Onset  . Lung cancer Father   . Asthma Brother   . Diabetes Maternal Grandfather   . Diabetes Maternal Uncle   . Hyperlipidemia Maternal Grandmother     Social History   Socioeconomic History  . Marital status: Married    Spouse name: Not on file  . Number of children: 1  . Years of education: 65  . Highest education level: Not on file  Occupational History  . Occupation: truck Animator Needs  . Financial resource strain: Not on file  . Food insecurity:    Worry: Not on file    Inability: Not on file  . Transportation needs:    Medical: Not on file    Non-medical: Not on file  Tobacco Use  . Smoking status: Former Smoker    Last attempt to quit: 06/06/1997    Years since quitting: 20.5  . Smokeless tobacco: Never Used  Substance and Sexual Activity  . Alcohol use: Yes    Comment: socially  .  Drug use: No  . Sexual activity: Yes    Partners: Female    Comment: married  Lifestyle  . Physical activity:    Days per week: Not on file    Minutes per session: Not on file  . Stress: Not on file  Relationships  . Social connections:    Talks on phone: Not on file    Gets together: Not on file    Attends religious service: Not on file    Active member of club or organization: Not on file    Attends meetings of clubs or organizations: Not on file    Relationship status: Not on file  Other Topics Concern  . Not on file  Social History Narrative   Married. 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    Wears seat belt. Exercises 3x or greater a week.    Former Smoker. Quit 1999, Occasional ETOH, no recreational drugs.    Drinks caffeine beverages. Uses herbal remedies. Takes a multivitamin.    Smoker alarm in the home.    Feels safe in his relationships.    Review of Systems - See HPI.  All other ROS are negative.  BP 122/86   Pulse (!) 59   Temp (!) 97.5 F (  36.4 C) (Oral)   Resp 16   Ht 6\' 1"  (1.854 m)   Wt 223 lb (101.2 kg)   SpO2 98%   BMI 29.42 kg/m   Physical Exam  Constitutional: He is oriented to person, place, and time. He appears well-developed and well-nourished.  HENT:  Head: Normocephalic and atraumatic.  Eyes: Conjunctivae are normal.  Pulmonary/Chest: Effort normal.  Neurological: He is alert and oriented to person, place, and time.  Skin:     Psychiatric: He has a normal mood and affect.  Vitals reviewed.   Assessment/Plan: 1. Chronic dermatitis Flexural surfaces of upper extremities. Does not seem eczematous. Concern for a milder allergic dermatitis versus heat rash. Will start 12-day prednisone taper along with Kenalog ointment. Supportive measures and OTC medications reviewed. Referral to Dermatology placed giving recurring symptoms.   - triamcinolone ointment (KENALOG) 0.5 %; Apply 1 application  topically 2 (two) times daily.  Dispense: 30 g; Refill: 0 - Ambulatory referral to Dermatology - predniSONE (DELTASONE) 10 MG tablet; Take 4 tablets (40 mg total) by mouth daily with breakfast for 3 days, THEN 3 tablets (30 mg total) daily with breakfast for 3 days, THEN 2 tablets (20 mg total) daily with breakfast for 3 days, THEN 1 tablet (10 mg total) daily with breakfast for 3 days.  Dispense: 30 tablet; Refill: 0   Leeanne Rio, PA-C

## 2018-01-17 ENCOUNTER — Other Ambulatory Visit: Payer: Self-pay | Admitting: Physician Assistant

## 2018-01-17 DIAGNOSIS — L309 Dermatitis, unspecified: Secondary | ICD-10-CM

## 2018-02-24 ENCOUNTER — Encounter: Payer: Self-pay | Admitting: Emergency Medicine

## 2018-02-24 ENCOUNTER — Emergency Department (INDEPENDENT_AMBULATORY_CARE_PROVIDER_SITE_OTHER)
Admission: EM | Admit: 2018-02-24 | Discharge: 2018-02-24 | Disposition: A | Discharge status: Home / Self Care | Payer: 59 | Source: Home / Self Care | Attending: Family Medicine | Admitting: Family Medicine

## 2018-02-24 DIAGNOSIS — J069 Acute upper respiratory infection, unspecified: Secondary | ICD-10-CM | POA: Diagnosis not present

## 2018-02-24 DIAGNOSIS — M5412 Radiculopathy, cervical region: Secondary | ICD-10-CM

## 2018-02-24 DIAGNOSIS — B9789 Other viral agents as the cause of diseases classified elsewhere: Secondary | ICD-10-CM

## 2018-02-24 MED ORDER — MELOXICAM 15 MG PO TABS: 15.0000 mg | ORAL_TABLET | Freq: Every day | ORAL | 0 refills | Status: DC

## 2018-02-24 MED ORDER — BENZONATATE 100 MG PO CAPS: 100.0000 mg | ORAL_CAPSULE | Freq: Three times a day (TID) | ORAL | 0 refills | Status: DC

## 2018-02-24 NOTE — Discharge Instructions (Signed)
°  You may take 500mg  acetaminophen every 4-6 hours or in combination with ibuprofen 400-600mg  every 6-8 hours as needed for pain, inflammation, and fever.  Be sure to drink water to stay well hydrated and get at least 8 hours of sleep at night, preferably more while sick.   Please follow up with family medicine in 1 week if not improving.

## 2018-02-24 NOTE — ED Provider Notes (Signed)
[facility]    CSN: [id number] Arrival date & time: 02/24/18  1412     History   Chief Complaint Chief Complaint  Patient presents with  . Cough    HPI Russell Ortiz is a 47 y.o. male.   HPI  Russell Ortiz is a 47 y.o. male presenting to UC with c/o 2 days of nonproductive cough and scratchy throat. Denies any other symptoms. No chest pain or SOB. no fever, chills, n/v/d. He has not taken any medication for his symptoms. Pt also c/o intermittent Left arm numbness and soreness. He does recall laying on his Left side a few weeks ago and landed on one of his daughter's toys. The pain and numbness have come and gone since then. 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  No specific known injury. He has not tried any medications or home treatment for his symptoms. Denies neck or back pain. Denies weakness of his Left arm or hand.    Past Medical History:  Diagnosis Date  . Genital warts   . Hyperlipidemia   . PUD (peptic ulcer disease)     Patient Active Problem List   Diagnosis Date Noted  . Neutropenia ([place]) 04/12/2017  . Overweight (BMI 25.0-29.9) 11/28/2016  . Elevated hemoglobin A1c 08/25/2016  . Elevated liver function tests 03/12/2015  . Hyperlipemia 03/12/2015    Past Surgical History:  Procedure Laterality Date  . VASECTOMY  2010       Home Medications    Prior to Admission medications   Medication Sig Start Date End Date Taking? Authorizing Provider  benzonatate (TESSALON) 100 MG capsule Take 1-2 capsules (100-200 mg total) by mouth every 8 (eight) hours. 02/24/18   Noe Gens, PA-C  colesevelam (WELCHOL) 625 MG tablet Take 1,250 mg by mouth 2 (two) times daily with a meal.     [provider]  meloxicam (MOBIC) 15 MG tablet Take 1 tablet (15 mg total) by mouth daily. 02/24/18   Noe Gens, PA-C  Multiple Vitamins-Minerals (MULTIVITAMIN ADULT) CHEW Chew by mouth.     [provider]  triamcinolone ointment (KENALOG) 0.5 % Apply 1 application topically 2 (two) times daily. 12/08/17   Brunetta Jeans, PA-C    Family History Family History  Problem Relation Age of Onset  . Lung cancer Father   . Asthma Brother   . Diabetes Maternal Grandfather   . Diabetes Maternal Uncle   . Hyperlipidemia Maternal Grandmother     Social History Social History   Tobacco Use  . Smoking status: Former Smoker    Last attempt to quit: 06/06/1997    Years since quitting: 20.7  . Smokeless tobacco: Never Used  Substance Use Topics  . Alcohol use: Yes    Comment: socially  . Drug use: No     Allergies   Statins   Review of Systems Review of Systems  Constitutional: Negative for chills and fever.  HENT: Positive for congestion. Negative for ear pain, postnasal drip, rhinorrhea and sore throat.   Respiratory: Positive for cough. Negative for shortness of breath.   Musculoskeletal: Positive for myalgias (Left arm). Negative for arthralgias, back pain and neck stiffness.  Neurological: Positive for numbness ( Left arm, intermittent). Negative for dizziness, weakness, light-headedness and headaches.     Physical Exam Triage Vital Signs ED Triage Vitals [02/24/18 1455]  Enc Vitals Group     BP [name])  151/79     Pulse Rate 60     Resp      Temp 98.2 F (36.8 C)     Temp Source Oral     SpO2 98 %     Weight 200 lb (90.7 kg)     Height      Head Circumference      Peak Flow      Pain Score 0     Pain Loc      Pain Edu?      Excl. in [place]?    No data found.  Updated Vital Signs BP (!) 151/79 (BP Location: Right Arm)   Pulse 60   Temp 98.2 F (36.8 C) (Oral)   Wt 200 lb (90.7 kg)   SpO2 98%   BMI 26.39 kg/m   Visual Acuity Right Eye Distance:   Left Eye Distance:   Bilateral Distance:    Right Eye Near:   Left Eye Near:    Bilateral Near:     Physical Exam  Constitutional: He is oriented to person, place, and time. He appears  well-developed and well-nourished. No distress.  HENT:  Head: Normocephalic and atraumatic.  Right Ear: Tympanic membrane normal.  Left Ear: Tympanic membrane normal.  Nose: Nose normal. Right sinus exhibits no maxillary sinus tenderness and no frontal sinus tenderness. Left sinus exhibits no maxillary sinus tenderness and no frontal sinus tenderness.  Mouth/Throat: Uvula is midline, oropharynx is clear and moist and mucous membranes are normal.  Eyes: EOM are normal.  Neck: Normal range of motion. Neck supple.  Cardiovascular: Normal rate and regular rhythm.  Pulses:      Radial pulses are 2+ on the left side.  Pulmonary/Chest: Effort normal and breath sounds normal. No stridor. No respiratory distress. He has no wheezes. He has no rales.  Musculoskeletal: Normal range of motion.  Lymphadenopathy:    He has no cervical adenopathy.  Neurological: He is alert and oriented to person, place, and time.  Full ROM upper and lower extremities with 5/5 strength. Normal sensation in Left arm.   Skin: Skin is warm and dry. Capillary refill takes less than 2 seconds. He is not diaphoretic.  Psychiatric: He has a normal mood and affect. His behavior is normal.  Nursing note and vitals reviewed.    UC Treatments / Results  Labs (all labs ordered are listed, but only abnormal results are displayed) Labs Reviewed - No data to display  EKG None  Radiology No results found.  Procedures Procedures (including critical care time)  Medications Ordered in UC Medications - No data to display  Initial Impression / Assessment and Plan / UC Course  I have reviewed the triage vital signs and the nursing notes.  Pertinent labs & imaging results that were available during my care of the patient were reviewed by me and considered in my medical decision making (see chart for details).     Cough c/w viral URI. No evidence of underlying bacterial infection.  Left arm numbness likely due to cervical  radiculopathy.  Pt info packet provided for home care. Encouraged f/u with PCP .  Final Clinical Impressions(s) / UC Diagnoses   Final diagnoses:  Viral URI with cough  Left cervical radiculopathy     Discharge Instructions      You may take 500mg  acetaminophen every 4-6 hours or in combination with ibuprofen 400-600mg  every 6-8 hours as needed for pain, inflammation, and fever.  Be sure to drink water to stay well  hydrated and get at least 8 hours of sleep at night, preferably more while sick.   Please follow up with family medicine in 1 week if not improving.     ED Prescriptions    Medication Sig Dispense Auth. Provider   benzonatate (TESSALON) 100 MG capsule Take 1-2 capsules (100-200 mg total) by mouth every 8 (eight) hours. 21 capsule Leeroy Cha O, PA-C   meloxicam (MOBIC) 15 MG tablet Take 1 tablet (15 mg total) by mouth daily. 20 tablet Noe Gens, PA-C     Controlled Substance Prescriptions [place] Controlled Substance Registry consulted? Not Applicable   Tyrell Antonio 02/24/18 1282

## 2018-02-24 NOTE — ED Triage Notes (Signed)
Patient c/o non-productive cough x 2 days, congestion, no fever

## 2018-03-08 ENCOUNTER — Ambulatory Visit (INDEPENDENT_AMBULATORY_CARE_PROVIDER_SITE_OTHER): Payer: 59 | Admitting: Family Medicine

## 2018-03-08 ENCOUNTER — Encounter: Payer: Self-pay | Admitting: Family Medicine

## 2018-03-08 ENCOUNTER — Other Ambulatory Visit: Payer: Self-pay

## 2018-03-08 ENCOUNTER — Encounter: Payer: Self-pay | Admitting: General Practice

## 2018-03-08 VITALS — BP 138/86 | HR 81 | Temp 98.1°F | Resp 16 | Ht 73.0 in | Wt 219.0 lb

## 2018-03-08 DIAGNOSIS — M25512 Pain in left shoulder: Secondary | ICD-10-CM

## 2018-03-08 MED ORDER — PREDNISONE 10 MG PO TABS: ORAL_TABLET | ORAL | 0 refills | Status: DC

## 2018-03-08 NOTE — Progress Notes (Signed)
   Subjective:    Patient ID: Russell Ortiz, male    DOB: [birth year], 47 y.o.   MRN: [id number]  HPI Shoulder pain- L sided.  Went to UC on 9/21 and was dx'd w/ cervical radiculopathy.  This past weekend [place]  Pain and numbness starts just behind shoulder and radiates down arm into hand.  Pain is localized to shoulder and 'tingling' extends down the arm.  Was given Meloxicam and 'it slows it down'.  No known injury but sxs started after lying on couch 3-4 weeks ago.  Pt is R hand dominant.  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   Review of Systems For ROS see HPI     Objective:   Physical Exam  Constitutional: He is oriented to person, place, and time. He appears well-developed and well-nourished. No distress.  HENT:  Head: Normocephalic and atraumatic.  Cardiovascular: Intact distal pulses.  Musculoskeletal: He exhibits tenderness (TTP over L AC joint). He exhibits no edema or deformity.  + hawkings test, + Neer's test, + empty can test Pain w/ abduction and forward flexion above 90 degrees Very limited internal rotation  Neurological: He is alert and oriented to person, place, and time. He displays normal reflexes. No cranial nerve deficit. Coordination normal.  Skin: Skin is warm and dry. No rash noted. No erythema.  Vitals reviewed.         Assessment & Plan:  L shoulder pain- new.  Pt has + impingement signs.  Stop Meloxicam and start Prednisone taper.  Refer to Ortho for complete evaluation and tx.  Pt expressed understanding and is in agreement w/ plan.

## 2018-03-08 NOTE — Patient Instructions (Signed)
Follow up as needed or as scheduled We'll call you with your Ortho appt STOP the Meloxicam START the Prednisone as directed ICE the shoulder Call with any questions or concerns Hang in there!

## 2018-03-09 DIAGNOSIS — M542 Cervicalgia: Secondary | ICD-10-CM | POA: Diagnosis not present

## 2018-03-09 DIAGNOSIS — M5412 Radiculopathy, cervical region: Secondary | ICD-10-CM | POA: Diagnosis not present

## 2018-03-20 ENCOUNTER — Encounter: Payer: Self-pay | Admitting: Physical Therapy

## 2018-03-20 ENCOUNTER — Other Ambulatory Visit: Payer: Self-pay

## 2018-03-20 ENCOUNTER — Ambulatory Visit: Payer: 59 | Attending: Orthopedic Surgery | Admitting: Physical Therapy

## 2018-03-20 DIAGNOSIS — M5412 Radiculopathy, cervical region: Secondary | ICD-10-CM | POA: Diagnosis not present

## 2018-03-20 DIAGNOSIS — M542 Cervicalgia: Secondary | ICD-10-CM | POA: Diagnosis not present

## 2018-03-20 NOTE — Therapy (Addendum)
[facility] [place], [place], [address] Phone: [phone]   Fax:  [phone]  Physical Therapy Treatment  Patient Details  Name: Russell Ortiz MRN: [id number] Date of Birth: [birth year] Referring Provider (PT): Jenetta Loges, [place]   Encounter Date: 03/20/2018  PT End of Session - 03/20/18 1602    Visit Number  1    Number of Visits  8    Date for PT Re-Evaluation  04/24/18    PT Start Time  8756    PT Stop Time  1609    PT Time Calculation (min)  54 min    Activity Tolerance  Patient tolerated treatment well;Patient limited by pain    Behavior During Therapy  [facility] for tasks assessed/performed       Past Medical History:  Diagnosis Date  . Genital warts   . Hyperlipidemia   . PUD (peptic ulcer disease)     Past Surgical History:  Procedure Laterality Date  . VASECTOMY  2010    There were no vitals filed for this visit.  Subjective Assessment - 03/20/18 1653    Subjective  Patient arrives to physical therapy with reports of left arm tingling and upper trapezius pain that began about 2 months ago. Patient is unsure of mechanism of injury.  Patient states the tingling runs down to left thumb but when tingling gets bad, tingling occurs in all fingers. Patient reports tingling occurs at worst with laying down and driving. Patient states nothing helps ease the tingling. Patient's goals are to sleep greater than 3 hours, return to the gym, improve ability to perform home activities, and improve strength.     Limitations  Sitting;House hold activities    Diagnostic tests  x-ray    Patient Stated Goals  stop tingling and get back to normal    Currently in Pain?  Yes    Pain Score  4     Pain Location  Arm    Pain Orientation  Left    Pain Descriptors / Indicators  Tingling    Pain Type  Acute pain    Pain Radiating Towards  thumb and fingers    Pain Onset  More than a month ago    Pain Frequency  Intermittent     Aggravating Factors   laying down, driving    Pain Relieving Factors  "nothing"         [facility] PT Assessment - 03/20/18 0001      Assessment   Medical Diagnosis  cervical radiculopathy    Referring Provider (PT)  Jenetta Loges, PA-C    Onset Date/Surgical Date  --   August 2019   Hand Dominance  Right    Next MD Visit  04/20/18    Prior Therapy  no      Precautions   Precautions  None      Restrictions   Weight Bearing Restrictions  No      Balance Screen   Has the patient fallen in the past 6 months  No    Has the patient had a decrease in activity level because of a fear of falling?   No    Is the patient reluctant to leave their home because of a fear of falling?   No      Home Social worker  Private residence    Living Arrangements  Spouse/significant other;Children      Prior Function   Level of Independence  Independent  Posture/Postural Control   Posture/Postural Control  Postural limitations    Postural Limitations  Rounded Shoulders;Forward head   slouched in chair     ROM / Strength   AROM / PROM / Strength  AROM;Strength      AROM   Overall AROM   Within functional limits for tasks performed    AROM Assessment Site  Cervical    Cervical Flexion  40    Cervical Extension  45   (+) Pain   Cervical - Right Side Bend  30    Cervical - Left Side Bend  30    Cervical - Right Rotation  62    Cervical - Left Rotation  60      Strength   Strength Assessment Site  Shoulder    Right/Left Shoulder  Left    Left Shoulder Flexion  4/5    Left Shoulder ABduction  4/5    Left Shoulder Internal Rotation  4+/5    Left Shoulder External Rotation  4+/5      Palpation   Palpation comment  tenderness to palpation to left upper trapezius and left anterior shoulder.       Special Tests   Other special tests  diminished triceps DTR       Ambulation/Gait   Gait Pattern  Within Functional Limits         IFC to cervical paraspinals and  bilateral UT 80-150 hz x10 mins for pain MHP to cervical spine for 10 minutes                  PT Education - 03/20/18 1556    Education Details  chin tucks, scapular retractions, corner stretch, UT stretch    Person(s) Educated  Patient    Methods  Explanation;Demonstration;Handout    Comprehension  Verbalized understanding;Returned demonstration          PT Long Term Goals - 03/20/18 1622      PT LONG TERM GOAL #1   Title  Patient will be independent with HEP    Time  4    Period  Weeks    Status  New      PT LONG TERM GOAL #2   Title  Patient will report no neurological symptoms down left UE to indicate no nerve irritation.    Time  4    Period  Weeks    Status  New      PT LONG TERM GOAL #3   Title  Patient will report ability to sleep for 5 hours or greater with cervical pain less than 3/10.    Time  4    Period  Weeks    Status  New      PT LONG TERM GOAL #4   Title  Patient will demonstrate 60+lb of grip for or equal to right to improve strength for grip activities.     Time  4    Period  Weeks    Status  New            Plan - 03/20/18 1658    Clinical Impression Statement  Patient is a 47 year old male who presents to physical therapy with pain with active cervical range of motion, L UE neurological symptoms, and decreased left shoulder MMT. Patient denies changes to sensation. Patient noted with diminished triceps DTR. Patient noted with [facility] bilateral shoulder AROM in all planes. Patient noted with tenderness to deep palpation along L UT and anterior shoulder. Patient would  benefit from skilled physical therapy to address deficits and address patient's goals.     Clinical Presentation  Evolving    Clinical Decision Making  Low    Rehab Potential  Good    PT Frequency  2x / week    PT Duration  4 weeks    PT Treatment/Interventions  ADLs/Self Care Home Management;Cryotherapy;Electrical Stimulation;Traction;Moist Heat;Iontophoresis 4mg /ml  Dexamethasone;Neuromuscular re-education;Passive range of motion;Manual techniques;Patient/family education;Therapeutic exercise;Therapeutic activities;Ultrasound;Dry needling    PT Next Visit Plan  UBE, postural exercises, chin tucks, modalities PRN for pain relief    PT Home Exercise Plan  see patient education section     Consulted and Agree with Plan of Care  Patient       Patient will benefit from skilled therapeutic intervention in order to improve the following deficits and impairments:  Pain, Decreased activity tolerance, Decreased endurance, Decreased range of motion, Decreased strength, Postural dysfunction, Impaired UE functional use  Visit Diagnosis: Cervicalgia - Plan: PT plan of care cert/re-cert  Radiculopathy, cervical region - Plan: PT plan of care cert/re-cert     Problem List Patient Active Problem List   Diagnosis Date Noted  . Neutropenia ([place]) 04/12/2017  . Overweight (BMI 25.0-29.9) 11/28/2016  . Elevated hemoglobin A1c 08/25/2016  . Elevated liver function tests 03/12/2015  . Hyperlipemia 03/12/2015   Gabriela Eves, PT, DPT 03/20/2018, 5:03 PM  [facility] [address] [place], [place], [address] Phone: [phone]   Fax:  [phone]  Name: Russell Ortiz MRN: [id number] Date of Birth: Jul 06, 1970

## 2018-03-27 ENCOUNTER — Ambulatory Visit: Payer: 59 | Admitting: Physical Therapy

## 2018-03-27 DIAGNOSIS — M542 Cervicalgia: Secondary | ICD-10-CM

## 2018-03-27 DIAGNOSIS — M5412 Radiculopathy, cervical region: Secondary | ICD-10-CM | POA: Diagnosis not present

## 2018-03-27 NOTE — Therapy (Signed)
[facility] [place], [place], [address] Phone: [phone]   Fax:  [phone]  Physical Therapy Treatment  Patient Details  Name: Russell Ortiz MRN: [id number] Date of Birth: Nov 05, 1970 Referring Provider (PT): Jenetta Loges, [place]   Encounter Date: 03/27/2018  PT End of Session - 03/27/18 1613    Visit Number  2    Number of Visits  8    Date for PT Re-Evaluation  04/24/18    PT Start Time  1600    PT Stop Time  1652    PT Time Calculation (min)  52 min    Activity Tolerance  Patient tolerated treatment well    Behavior During Therapy  [facility] for tasks assessed/performed       Past Medical History:  Diagnosis Date  . Genital warts   . Hyperlipidemia   . PUD (peptic ulcer disease)     Past Surgical History:  Procedure Laterality Date  . VASECTOMY  2010    There were no vitals filed for this visit.  Subjective Assessment - 03/27/18 1613    Subjective  Patient reported feeling "alright" stated he symptoms down his left arm have been intermittent. Pain is 2-3/10.    Limitations  Sitting;House hold activities    Diagnostic tests  x-ray    Patient Stated Goals  stop tingling and get back to normal    Currently in Pain?  Yes    Pain Score  3     Pain Location  Neck    Pain Orientation  Left    Pain Descriptors / Indicators  Tingling    Pain Type  Acute pain    Pain Onset  More than a month ago         [facility] PT Assessment - 03/27/18 0001      Assessment   Medical Diagnosis  cervical radiculopathy                   [facility] Adult PT Treatment/Exercise - 03/27/18 0001      Exercises   Exercises  Neck      Neck Exercises: Machines for Strengthening   UBE (Upper Arm Bike)  120 RPM 6 minutes 3 fwd, 3 bwd,       Neck Exercises: Theraband   Rows  20 reps    Rows Limitations  Pink XTS 2x10    Other Theraband Exercises  Shoulder extension 2x10 Pink XTS      Neck Exercises: Standing   Neck  Retraction  --      Modalities   Modalities  Electrical Stimulation;Moist Heat      Moist Heat Therapy   Number Minutes Moist Heat  15 Minutes    Moist Heat Location  Cervical      Electrical Stimulation   Electrical Stimulation Location  cervical    Electrical Stimulation Action  IFC    Electrical Stimulation Parameters  80-150 hz x15 min    Electrical Stimulation Goals  Pain      Manual Therapy   Manual Therapy  Soft tissue mobilization;Manual Traction    Soft tissue mobilization  STW/M to L UT to reduce tone and pain    Manual Traction  manual traction 3x30 seconds as well as sub occipital release      Neck Exercises: Stretches   Levator Stretch  2 reps;30 seconds                  PT Long Term Goals -  03/20/18 1622      PT LONG TERM GOAL #1   Title  Patient will be independent with HEP    Time  4    Period  Weeks    Status  New      PT LONG TERM GOAL #2   Title  Patient will report no neurological symptoms down left UE to indicate no nerve irritation.    Time  4    Period  Weeks    Status  New      PT LONG TERM GOAL #3   Title  Patient will report ability to sleep for 5 hours or greater with cervical pain less than 3/10.    Time  4    Period  Weeks    Status  New      PT LONG TERM GOAL #4   Title  Patient will demonstrate 60+lb of grip for or equal to right to improve strength for grip activities.     Time  4    Period  Weeks    Status  New            Plan - 03/27/18 1644    Clinical Impression Statement  Patient was able to tolerate treatment well despite reports of increased numbness in thumb and index finger during UBE. Hold UBE next visit. Patient was able to demonstrate good form with all exercises after demonstration. Patient noted with increased L UT tone which reduced after STW/M. Patient responded well to manual traction and denied any symptoms to the left UE. Normal response to modalities upon removal.     Clinical Presentation   Evolving    Clinical Decision Making  Low    Rehab Potential  Good    PT Frequency  2x / week    PT Duration  4 weeks    PT Treatment/Interventions  ADLs/Self Care Home Management;Cryotherapy;Electrical Stimulation;Traction;Moist Heat;Iontophoresis 4mg /ml Dexamethasone;Neuromuscular re-education;Passive range of motion;Manual techniques;Patient/family education;Therapeutic exercise;Therapeutic activities;Ultrasound;Dry needling    PT Next Visit Plan  assess response to treatment hold UBE due to numbness, postural exercises, chin tucks, modalities PRN for pain relief    PT Home Exercise Plan  see patient education section     Consulted and Agree with Plan of Care  Patient       Patient will benefit from skilled therapeutic intervention in order to improve the following deficits and impairments:  Pain, Decreased activity tolerance, Decreased endurance, Decreased range of motion, Decreased strength, Postural dysfunction, Impaired UE functional use  Visit Diagnosis: Cervicalgia  Radiculopathy, cervical region     Problem List Patient Active Problem List   Diagnosis Date Noted  . Neutropenia ([place]) 04/12/2017  . Overweight (BMI 25.0-29.9) 11/28/2016  . Elevated hemoglobin A1c 08/25/2016  . Elevated liver function tests 03/12/2015  . Hyperlipemia 03/12/2015   Gabriela Eves, PT, DPT 03/27/2018, 4:53 PM  [facility] [address] [place], [place], [address] Phone: [phone]   Fax:  [phone]  Name: Russell Ortiz MRN: [id number] Date of Birth: 1970/08/26

## 2018-03-29 ENCOUNTER — Ambulatory Visit: Payer: 59 | Admitting: Physical Therapy

## 2018-03-29 ENCOUNTER — Encounter: Payer: Self-pay | Admitting: Physical Therapy

## 2018-03-29 DIAGNOSIS — M542 Cervicalgia: Secondary | ICD-10-CM

## 2018-03-29 DIAGNOSIS — M5412 Radiculopathy, cervical region: Secondary | ICD-10-CM

## 2018-03-29 NOTE — Therapy (Signed)
[facility] [place], [place], [address] Phone: [phone]   Fax:  [phone]  Physical Therapy Treatment  Patient Details  Name: Russell Ortiz MRN: [id number] Date of Birth: 03-17-1971 Referring Provider (PT): Jenetta Loges, [place]   Encounter Date: 03/29/2018  PT End of Session - 03/29/18 1637    Visit Number  3    Number of Visits  8    Date for PT Re-Evaluation  04/24/18    PT Start Time  1600    PT Stop Time  1649    PT Time Calculation (min)  49 min    Activity Tolerance  Patient tolerated treatment well    Behavior During Therapy  [facility] for tasks assessed/performed       Past Medical History:  Diagnosis Date  . Genital warts   . Hyperlipidemia   . PUD (peptic ulcer disease)     Past Surgical History:  Procedure Laterality Date  . VASECTOMY  2010    There were no vitals filed for this visit.  Subjective Assessment - 03/29/18 1606    Subjective  Patient reports no pain at the moment.    Limitations  Sitting;House hold activities    Diagnostic tests  x-ray    Patient Stated Goals  stop tingling and get back to normal    Currently in Pain?  No/denies         [facility] PT Assessment - 03/29/18 0001      Assessment   Medical Diagnosis  cervical radiculopathy    Hand Dominance  Right    Next MD Visit  04/20/18    Prior Therapy  no                   [facility] Adult PT Treatment/Exercise - 03/29/18 0001      Exercises   Exercises  Neck      Neck Exercises: Theraband   Rows  Other (comment)   2x15   Rows Limitations  Pink XTS 2x15    Other Theraband Exercises  Shoulder extension 2x15 Pink XTS      Neck Exercises: Standing   Neck Retraction  20 reps;5 secs    Other Standing Exercises  chin tuck with horizontal abduction red 2x10    Other Standing Exercises  X to V 2x10      Neck Exercises: Seated   X to V  --      Modalities   Modalities  Electrical Stimulation;Moist Heat      Moist  Heat Therapy   Number Minutes Moist Heat  15 Minutes    Moist Heat Location  Cervical      Electrical Stimulation   Electrical Stimulation Location  cervical    Electrical Stimulation Action  IFC    Electrical Stimulation Parameters  80-150 hz x15 min    Electrical Stimulation Goals  Pain      Manual Therapy   Manual Therapy  Soft tissue mobilization;Manual Traction    Soft tissue mobilization  STW/M to L UT to reduce tone and pain    Manual Traction  manual traction 5x30 seconds as well as sub occipital release; L UT stretch 3x30"                  PT Long Term Goals - 03/20/18 1622      PT LONG TERM GOAL #1   Title  Patient will be independent with HEP    Time  4    Period  Weeks    Status  New      PT LONG TERM GOAL #2   Title  Patient will report no neurological symptoms down left UE to indicate no nerve irritation.    Time  4    Period  Weeks    Status  New      PT LONG TERM GOAL #3   Title  Patient will report ability to sleep for 5 hours or greater with cervical pain less than 3/10.    Time  4    Period  Weeks    Status  New      PT LONG TERM GOAL #4   Title  Patient will demonstrate 60+lb of grip for or equal to right to improve strength for grip activities.     Time  4    Period  Weeks    Status  New            Plan - 03/29/18 1638    Clinical Impression Statement  Patient was able to tolerate treatment well despite reports of numbness to elbow during X to V exercises. Patient was able to tolerate STW/M to L UT as well as manual traction and reported decrease in radiating symptoms after manual therapy techniques. Attempt mechanical traction at 15# next visit. No adverse affects noted upon removal.     Clinical Presentation  Evolving    Clinical Decision Making  Low    Rehab Potential  Good    PT Frequency  2x / week    PT Duration  4 weeks    PT Treatment/Interventions  ADLs/Self Care Home Management;Cryotherapy;Electrical  Stimulation;Traction;Moist Heat;Iontophoresis 4mg /ml Dexamethasone;Neuromuscular re-education;Passive range of motion;Manual techniques;Patient/family education;Therapeutic exercise;Therapeutic activities;Ultrasound;Dry needling    PT Next Visit Plan  Assess goals; attempt manual traction; hold UBE due to numbness, postural exercises, chin tucks, modalities PRN for pain relief    PT Home Exercise Plan  see patient education section     Consulted and Agree with Plan of Care  Patient       Patient will benefit from skilled therapeutic intervention in order to improve the following deficits and impairments:  Pain, Decreased activity tolerance, Decreased endurance, Decreased range of motion, Decreased strength, Postural dysfunction, Impaired UE functional use  Visit Diagnosis: Cervicalgia  Radiculopathy, cervical region     Problem List Patient Active Problem List   Diagnosis Date Noted  . Neutropenia ([place]) 04/12/2017  . Overweight (BMI 25.0-29.9) 11/28/2016  . Elevated hemoglobin A1c 08/25/2016  . Elevated liver function tests 03/12/2015  . Hyperlipemia 03/12/2015   Gabriela Eves, PT, DPT 03/29/2018, 4:53 PM  [facility] [address] [place], [place], [address] Phone: [phone]   Fax:  [phone]  Name: Russell Ortiz MRN: [id number] Date of Birth: 06/19/1970

## 2018-04-03 ENCOUNTER — Ambulatory Visit: Payer: 59 | Admitting: Physical Therapy

## 2018-04-03 ENCOUNTER — Encounter: Payer: Self-pay | Admitting: Physical Therapy

## 2018-04-03 DIAGNOSIS — M542 Cervicalgia: Secondary | ICD-10-CM | POA: Diagnosis not present

## 2018-04-03 DIAGNOSIS — M5412 Radiculopathy, cervical region: Secondary | ICD-10-CM

## 2018-04-03 NOTE — Therapy (Signed)
[facility] [place], [place], [address] Phone: [phone]   Fax:  [phone]  Physical Therapy Treatment  Patient Details  Name: Russell Ortiz MRN: [id number] Date of Birth: [birth year] Referring Provider (PT): Jenetta Loges, [place]   Encounter Date: 04/03/2018  PT End of Session - 04/03/18 1601    Visit Number  4    Number of Visits  8    Date for PT Re-Evaluation  04/24/18    PT Start Time  3716    PT Stop Time  1640    PT Time Calculation (min)  42 min    Activity Tolerance  Patient tolerated treatment well    Behavior During Therapy  [facility] for tasks assessed/performed       Past Medical History:  Diagnosis Date  . Genital warts   . Hyperlipidemia   . PUD (peptic ulcer disease)     Past Surgical History:  Procedure Laterality Date  . VASECTOMY  2010    There were no vitals filed for this visit.  Subjective Assessment - 04/03/18 1601    Subjective  No pain upon arrival. Reports only intermittant pain in UEs while sitting or driving.     Limitations  Sitting;House hold activities    Diagnostic tests  x-ray    Patient Stated Goals  stop tingling and get back to normal    Currently in Pain?  No/denies         [facility] PT Assessment - 04/03/18 0001      Assessment   Medical Diagnosis  cervical radiculopathy    Referring Provider (PT)  Jenetta Loges, PA-C    Hand Dominance  Right    Next MD Visit  04/20/18    Prior Therapy  no      Precautions   Precautions  None      Restrictions   Weight Bearing Restrictions  No                   [facility] Adult PT Treatment/Exercise - 04/03/18 0001      Exercises   Exercises  Neck      Neck Exercises: Machines for Strengthening   UBE (Upper Arm Bike)  90 RPM x 6 min      Neck Exercises: Theraband   Shoulder Extension  20 reps;Limitations    Shoulder Extension Limitations  Pink XTS    Rows  20 reps;Limitations    Rows Limitations  Pink XTS       Neck Exercises: Standing   Neck Retraction  10 reps;5 secs    Wall Push Ups  20 reps    Upper Extremity Flexion with Stabilization  Flexion;20 reps    Upper Extremity D2  Flexion;Theraband;20 reps    Theraband Level (UE D2)  Level 3 (Green)    Other Standing Exercises  chin tuck with horizontal abduction green 2x10    Other Standing Exercises  X to V 2x10      Neck Exercises: Prone   W Back  20 reps      Modalities   Modalities  Electrical Stimulation;Moist Heat      Moist Heat Therapy   Number Minutes Moist Heat  10 Minutes    Moist Heat Location  Cervical      Electrical Stimulation   Electrical Stimulation Location  B cervical     Electrical Stimulation Action  Pre-Mod    Electrical Stimulation Parameters  80-150 hz x10 min    Electrical Stimulation Goals  Other (comment)   Post therex fatigue                 PT Long Term Goals - 03/20/18 1622      PT LONG TERM GOAL #1   Title  Patient will be independent with HEP    Time  4    Period  Weeks    Status  New      PT LONG TERM GOAL #2   Title  Patient will report no neurological symptoms down left UE to indicate no nerve irritation.    Time  4    Period  Weeks    Status  New      PT LONG TERM GOAL #3   Title  Patient will report ability to sleep for 5 hours or greater with cervical pain less than 3/10.    Time  4    Period  Weeks    Status  New      PT LONG TERM GOAL #4   Title  Patient will demonstrate 60+lb of grip for or equal to right to improve strength for grip activities.     Time  4    Period  Weeks    Status  New            Plan - 04/03/18 1641    Clinical Impression Statement  Patient presented in clinic with no current cervical pain or UE symptoms. Patient reported intermittant UE symptoms with varying radiation to elbow or to his hand. Radiating UE symptoms was reported as occuring maybe 1-2 times per day. Patient instructed throughout therex session of proper posture and VCs for  correct technique. No complaints of any increased pain or discomfort during therex. Following therex session patient reported muscle fatigue. Normal modalities response to assist with reduction of fatigue by end of treatment.    Rehab Potential  Good    PT Frequency  2x / week    PT Duration  4 weeks    PT Treatment/Interventions  ADLs/Self Care Home Management;Cryotherapy;Electrical Stimulation;Traction;Moist Heat;Iontophoresis 4mg /ml Dexamethasone;Neuromuscular re-education;Passive range of motion;Manual techniques;Patient/family education;Therapeutic exercise;Therapeutic activities;Ultrasound;Dry needling    PT Next Visit Plan  Continue with therex and assess possible tracion.     PT Home Exercise Plan  see patient education section     Consulted and Agree with Plan of Care  Patient       Patient will benefit from skilled therapeutic intervention in order to improve the following deficits and impairments:  Pain, Decreased activity tolerance, Decreased endurance, Decreased range of motion, Decreased strength, Postural dysfunction, Impaired UE functional use  Visit Diagnosis: Cervicalgia  Radiculopathy, cervical region     Problem List Patient Active Problem List   Diagnosis Date Noted  . Neutropenia ([place]) 04/12/2017  . Overweight (BMI 25.0-29.9) 11/28/2016  . Elevated hemoglobin A1c 08/25/2016  . Elevated liver function tests 03/12/2015  . Hyperlipemia 03/12/2015    Standley Brooking, PTA 04/03/2018, 4:59 PM  [facility] [place], [place], [address] Phone: [phone]   Fax:  [phone]  Name: Russell Ortiz MRN: [id number] Date of Birth: 1970-08-13

## 2018-04-04 ENCOUNTER — Ambulatory Visit (INDEPENDENT_AMBULATORY_CARE_PROVIDER_SITE_OTHER): Payer: 59 | Admitting: Family Medicine

## 2018-04-04 ENCOUNTER — Encounter: Payer: Self-pay | Admitting: Family Medicine

## 2018-04-04 VITALS — BP 146/84 | HR 75 | Temp 98.1°F | Resp 20 | Ht 73.0 in | Wt 225.0 lb

## 2018-04-04 DIAGNOSIS — R2242 Localized swelling, mass and lump, left lower limb: Secondary | ICD-10-CM

## 2018-04-04 DIAGNOSIS — R361 Hematospermia: Secondary | ICD-10-CM | POA: Diagnosis not present

## 2018-04-04 DIAGNOSIS — M79652 Pain in left thigh: Secondary | ICD-10-CM | POA: Diagnosis not present

## 2018-04-04 MED ORDER — CYCLOBENZAPRINE HCL 10 MG PO TABS: 10.0000 mg | ORAL_TABLET | Freq: Two times a day (BID) | ORAL | 0 refills | Status: DC

## 2018-04-04 MED ORDER — NAPROXEN 500 MG PO TABS: 500.0000 mg | ORAL_TABLET | Freq: Two times a day (BID) | ORAL | 0 refills | Status: DC

## 2018-04-04 NOTE — Progress Notes (Signed)
Russell Ortiz , [birth year], 47 y.o., male MRN: [id number] Patient Care Team    Relationship Specialty Notifications Start End  Ma Hillock, DO PCP - General Family Medicine  08/17/15     Chief Complaint  Patient presents with  . Leg Pain    left inner thigh had blood in semen pain started Saturday      Subjective: Pt presents for an OV with complaints of left medial thigh pain of 5 days duration and bloody ejaculation on Sunday. He states he might have stepped off the truck wrong on Friday, but he is just not sure. Saturday the pain started to become worse after walking. The pain continued into Sunday and after engaging in sex he noticed pink tinge to his ejaculate. He reports the ejaculation was not painful. He has noticed since then walking, and climbing steps is causing discomfort to his inner thigh. He denies numbness, tingling, temp/color  change or weakness of lower ext. He denies fever, chills, dysuria, prostate or rectal pain. He has had normal BM without pain  and normal urine output. He tried putting ice over his thigh over the weekend, it helped a little.  He denies penile/scrotal pain, swelling, erythema or lesions.     Depression screen [facility] 2/9 09/14/2017 04/12/2017 03/17/2016  Decreased Interest 0 0 0  Down, Depressed, Hopeless 0 0 0  PHQ - 2 Score 0 0 0    Allergies  Allergen Reactions  . Statins Other (See Comments)    mylagia   Social History   Tobacco Use  . Smoking status: Former Smoker    Last attempt to quit: 06/06/1997    Years since quitting: 20.8  . Smokeless tobacco: Never Used  Substance Use Topics  . Alcohol use: Yes    Comment: socially   Past Medical History:  Diagnosis Date  . Genital warts   . Hyperlipidemia   . PUD (peptic ulcer disease)    Past Surgical History:  Procedure Laterality Date  . VASECTOMY  2010   Family History  Problem Relation Age of Onset  . Lung cancer Father   . Asthma Brother   . Diabetes Maternal Grandfather     . Diabetes Maternal Uncle   . Hyperlipidemia Maternal Grandmother    Allergies as of 04/04/2018      Reactions   Statins Other (See Comments)   mylagia      Medication List        Accurate as of 04/04/18  4:56 PM. Always use your most recent med list.          colesevelam 625 MG tablet Commonly known as:  WELCHOL Take 1,250 mg by mouth 2 (two) times daily with a meal.   cyclobenzaprine 10 MG tablet Commonly known as:  FLEXERIL Take 1 tablet (10 mg total) by mouth 2 (two) times daily.   MULTIVITAMIN ADULT Chew Chew by mouth.   naproxen 500 MG tablet Commonly known as:  NAPROSYN Take 1 tablet (500 mg total) by mouth 2 (two) times daily with a meal.       All past medical history, surgical history, allergies, family history, immunizations andmedications were updated in the EMR today and reviewed under the history and medication portions of their EMR.     ROS: Negative, with the exception of above mentioned in HPI   Objective:  BP (!) 146/84 (BP Location: Left Arm, Patient Position: Sitting, Cuff Size: Large)   Pulse 75   Temp 98.1 F (36.7  C)   Resp 20   Ht 6\' 1"  (1.854 m)   Wt 225 lb (102.1 kg)   SpO2 97%   BMI 29.69 kg/m  Body mass index is 29.69 kg/m. Gen: Afebrile. No acute distress. Nontoxic in appearance, well developed, well nourished.  HENT: AT. [place]. MMM, no oral lesions.  Eyes:Pupils Equal Round Reactive to light, Extraocular movements intact,  Conjunctiva without redness, discharge or icterus. Abd: Soft. NTND. BS present. no Masses palpated. No rebound or guarding.  MSK: No bruising, lesions, swelling or erythema groin, scrotum or thigh. TTP medial left thigh, ~3 in from groin w/ deep palpation ? Fullness/mass beneath muscle. With deeper palpation of this area he reports tingling sensation that radiates down inner thigh towards knee. Femoral artery and popliteal arteries with normal pulses. Mild discomfort with hip flexion against resistance and  external rotation.  Skin: No rashes, purpura or petechiae.  Neuro: Normal gait. PERLA. EOMi. Alert. Oriented x3  Psych: Normal affect, dress and demeanor. Normal speech. Normal thought content and judgment.  No exam data present No results found. No results found for this or any previous visit (from the past 24 hour(s)).  Assessment/Plan: Russell Ortiz is a 47 y.o. male present for OV for  Hematospermia/pain left medial thigh Possible strain vs inguinal/femoral hernia vs thigh strain with hematoma. He is tender ~3 in from groin, within or under gracilus muscle or abductor longus, and has numbness/tingling sensation when are palpated deeply suggesting obturator nerve irration - along with hematospermia.  - Urinalysis, Routine w reflex microscopic - Urine Culture - CBC w/Diff - US Scrotum; Future - US Pelvis Limited; Future - [place] LT LOWER EXTREM LTD SOFT TISSUE NON VASCULAR; Future - discussed options with him today and elected to treat as a strain or injury with naproxen and flexeril, while ruling out infection, hematoma or hernia. He is agreeable to this today.  - f/u dependent on clinic outcome and testing results   Reviewed expectations re: course of current medical issues.  Discussed self-management of symptoms.  Outlined signs and symptoms indicating need for more acute intervention.  Patient verbalized understanding and all questions were answered.  Patient received an After-Visit Summary.    Orders Placed This Encounter  Procedures  . Urine Culture  . US Scrotum  . US Pelvis Limited  . [place] LT LOWER EXTREM LTD SOFT TISSUE NON VASCULAR  . Urinalysis, Routine w reflex microscopic  . CBC w/Diff     Note is dictated utilizing voice recognition software. Although note has been proof read prior to signing, occasional typographical errors still can be missed. If any questions arise, please do not hesitate to call for verification.   electronically signed by:  Howard Pouch,  DO  [place]

## 2018-04-04 NOTE — Patient Instructions (Signed)
I will order the Ultrasound of the area. They will likely perform the ultrasound of scrotum and thigh.   Start naproxen every 12 hours with food for 5 days, then can use as needed only for discomfort.   Flexeril (cyclobenzaprine) is muscle relaxer, use at night first to make sure you dont get too sleepy with use. If able you can take every 12 hours.   I am uncertain if you strained a muscle or have infection/hematoma ofyour thigh. The labs and image will help [place] figure it out.  If pain worsens or you develop scrotal pain, swelling or fever--> please be seen immediatly in ED

## 2018-04-05 ENCOUNTER — Encounter: Payer: Self-pay | Admitting: Physical Therapy

## 2018-04-05 LAB — URINALYSIS, ROUTINE W REFLEX MICROSCOPIC
Bilirubin Urine: NEGATIVE
GLUCOSE, UA: NEGATIVE
HGB URINE DIPSTICK: NEGATIVE
Ketones, ur: NEGATIVE
LEUKOCYTES UA: NEGATIVE
NITRITE: NEGATIVE
Protein, ur: NEGATIVE
SPECIFIC GRAVITY, URINE: 1.011 (ref 1.001–1.03)
pH: 6 (ref 5.0–8.0)

## 2018-04-05 LAB — CBC WITH DIFFERENTIAL/PLATELET
BASOS ABS: 0 10*3/uL (ref 0.0–0.1)
Basophils Relative: 0.7 % (ref 0.0–3.0)
EOS ABS: 0.2 10*3/uL (ref 0.0–0.7)
Eosinophils Relative: 5 % (ref 0.0–5.0)
HEMATOCRIT: 45.3 % (ref 39.0–52.0)
HEMOGLOBIN: 14.8 g/dL (ref 13.0–17.0)
LYMPHS PCT: 54.9 % — AB (ref 12.0–46.0)
Lymphs Abs: 2 10*3/uL (ref 0.7–4.0)
MCHC: 32.6 g/dL (ref 30.0–36.0)
MCV: 85.5 fl (ref 78.0–100.0)
MONO ABS: 0.2 10*3/uL (ref 0.1–1.0)
Monocytes Relative: 5.7 % (ref 3.0–12.0)
Neutro Abs: 1.2 10*3/uL — ABNORMAL LOW (ref 1.4–7.7)
Neutrophils Relative %: 33.7 % — ABNORMAL LOW (ref 43.0–77.0)
Platelets: 202 10*3/uL (ref 150.0–400.0)
RBC: 5.3 Mil/uL (ref 4.22–5.81)
RDW: 13.9 % (ref 11.5–15.5)
WBC: 3.7 10*3/uL — ABNORMAL LOW (ref 4.0–10.5)

## 2018-04-05 LAB — URINE CULTURE
MICRO NUMBER:: 91306410
RESULT: NO GROWTH
SPECIMEN QUALITY: ADEQUATE

## 2018-04-06 ENCOUNTER — Telehealth: Payer: Self-pay | Admitting: *Deleted

## 2018-04-06 DIAGNOSIS — R2242 Localized swelling, mass and lump, left lower limb: Secondary | ICD-10-CM

## 2018-04-06 HISTORY — DX: Localized swelling, mass and lump, left lower limb: R22.42

## 2018-04-06 NOTE — Telephone Encounter (Signed)
Left message for Russell Ortiz OK to change orders as directed.

## 2018-04-06 NOTE — Telephone Encounter (Signed)
Copied from Guy [phone]. Topic: General - Other >> Apr 06, 2018 12:31 PM Vernona Rieger wrote: Reason for CRM: Lindley Magnus with [facility] called and said that they received orders yesterday and she needs some of these changed. The order for " ultra sound of scrotum needs to say with doppler " & " ultra sound pelvis limited ( she said they do not need this, as what they are looking for they can see that on the ultra sound with the scrotum) " but if you prefer they do it, then they will be glad too. " ultra sound of the lower left extremities , they need the diagnosis to be more than just " pain " for the left thigh. She said that this can be changed in epic or you can call her at [phone]. Thanks.

## 2018-04-06 NOTE — Telephone Encounter (Signed)
Please call them to add doppler as they want, and add  Dx: mass of left thigh- R22.42

## 2018-04-10 ENCOUNTER — Ambulatory Visit: Payer: 59 | Attending: Orthopedic Surgery | Admitting: *Deleted

## 2018-04-10 DIAGNOSIS — M5412 Radiculopathy, cervical region: Secondary | ICD-10-CM | POA: Insufficient documentation

## 2018-04-10 DIAGNOSIS — M542 Cervicalgia: Secondary | ICD-10-CM | POA: Insufficient documentation

## 2018-04-11 NOTE — Addendum Note (Signed)
Addended by: Leota Jacobsen on: 04/11/2018 11:20 AM   Modules accepted: Orders

## 2018-04-12 ENCOUNTER — Ambulatory Visit: Payer: 59 | Admitting: Physical Therapy

## 2018-04-13 ENCOUNTER — Other Ambulatory Visit: Payer: Self-pay

## 2018-04-13 ENCOUNTER — Ambulatory Visit
Admission: RE | Admit: 2018-04-13 | Discharge: 2018-04-13 | Disposition: A | Discharge status: Home / Self Care | Payer: 59 | Source: Ambulatory Visit | Attending: Family Medicine | Admitting: Family Medicine

## 2018-04-13 ENCOUNTER — Ambulatory Visit (INDEPENDENT_AMBULATORY_CARE_PROVIDER_SITE_OTHER): Payer: 59 | Admitting: Family Medicine

## 2018-04-13 ENCOUNTER — Encounter: Payer: Self-pay | Admitting: Family Medicine

## 2018-04-13 VITALS — BP 128/80 | HR 63 | Temp 98.4°F | Resp 20 | Ht 73.0 in | Wt 223.0 lb

## 2018-04-13 DIAGNOSIS — Z125 Encounter for screening for malignant neoplasm of prostate: Secondary | ICD-10-CM | POA: Diagnosis not present

## 2018-04-13 DIAGNOSIS — E663 Overweight: Secondary | ICD-10-CM | POA: Diagnosis not present

## 2018-04-13 DIAGNOSIS — R7309 Other abnormal glucose: Secondary | ICD-10-CM

## 2018-04-13 DIAGNOSIS — Z1211 Encounter for screening for malignant neoplasm of colon: Secondary | ICD-10-CM

## 2018-04-13 DIAGNOSIS — Z0000 Encounter for general adult medical examination without abnormal findings: Secondary | ICD-10-CM

## 2018-04-13 DIAGNOSIS — E785 Hyperlipidemia, unspecified: Secondary | ICD-10-CM | POA: Diagnosis not present

## 2018-04-13 DIAGNOSIS — R361 Hematospermia: Secondary | ICD-10-CM | POA: Diagnosis not present

## 2018-04-13 DIAGNOSIS — Z79899 Other long term (current) drug therapy: Secondary | ICD-10-CM | POA: Diagnosis not present

## 2018-04-13 DIAGNOSIS — M79652 Pain in left thigh: Secondary | ICD-10-CM

## 2018-04-13 DIAGNOSIS — Z23 Encounter for immunization: Secondary | ICD-10-CM

## 2018-04-13 LAB — LIPID PANEL
CHOLESTEROL: 270 mg/dL — AB (ref 0–200)
HDL: 63.9 mg/dL (ref >39.00)
LDL CALC: 184 mg/dL — AB (ref 0–99)
NonHDL: 206.05
TRIGLYCERIDES: 112 mg/dL (ref 0.0–149.0)
Total CHOL/HDL Ratio: 4
VLDL: 22.4 mg/dL (ref 0.0–40.0)

## 2018-04-13 LAB — COMPREHENSIVE METABOLIC PANEL
ALBUMIN: 4.9 g/dL (ref 3.5–5.2)
ALK PHOS: 44 U/L (ref 39–117)
ALT: 36 U/L (ref 0–53)
AST: 50 U/L — ABNORMAL HIGH (ref 0–37)
BUN: 9 mg/dL (ref 6–23)
CALCIUM: 10.1 mg/dL (ref 8.4–10.5)
CHLORIDE: 105 meq/L (ref 96–112)
CO2: 31 mEq/L (ref 19–32)
Creatinine, Ser: 1.08 mg/dL (ref 0.40–1.50)
GFR: 94.65 mL/min (ref >60.00)
Glucose, Bld: 98 mg/dL (ref 70–99)
POTASSIUM: 4.4 meq/L (ref 3.5–5.1)
Sodium: 140 mEq/L (ref 135–145)
TOTAL PROTEIN: 7.7 g/dL (ref 6.0–8.3)
Total Bilirubin: 0.7 mg/dL (ref 0.2–1.2)

## 2018-04-13 LAB — PSA: PSA: 0.3 ng/mL (ref 0.10–4.00)

## 2018-04-13 LAB — HEMOGLOBIN A1C: Hgb A1c MFr Bld: 6 % (ref 4.6–6.5)

## 2018-04-13 NOTE — Patient Instructions (Signed)

## 2018-04-13 NOTE — Progress Notes (Signed)
Patient ID: Russell Ortiz, male  DOB: 1971-03-18, 47 y.o.   MRN: [id number] Patient Care Team    Relationship Specialty Notifications Start End  Ma Hillock, DO PCP - General Family Medicine  08/17/15     Chief Complaint  Patient presents with  . Annual Exam    Subjective:  Russell Ortiz is a 47 y.o. male present for CPE. All past medical history, surgical history, allergies, family history, immunizations, medications and social history were updated in the electronic medical record today. All recent labs, ED visits and hospitalizations within the last year were reviewed.  Health maintenance:  Colonoscopy: [name] with a P-uncle colon cancer at 14. Early screening may be warranted. Referral placed to discuss.  Immunizations:  tdap updated today, influenza updated today Infectious disease screening: HIV 2016 PSA:  Lab Results  Component Value Date   PSA 0.28 04/12/2017   PSA 0.21 03/17/2016   PSA 0.24 03/12/2015  , pt was counseled on prostate cancer screenings.  Assistive device: none Oxygen use: none Patient has a Dental home. Hospitalizations/ED visits: none  Depression screen [facility] 2/9 04/13/2018 09/14/2017 04/12/2017 03/17/2016  Decreased Interest 0 0 0 0  Down, Depressed, Hopeless 0 0 0 0  PHQ - 2 Score 0 0 0 0   No flowsheet data found.   Current Exercise Habits: Structured exercise class, Type of exercise: strength training/weights;walking, Time (Minutes): 60, Frequency (Times/Week): 2, Weekly Exercise (Minutes/Week): 120, Intensity: Moderate   Fall Risk  09/14/2017 03/17/2016  Falls in the past year? No No      Immunization History  Administered Date(s) Administered  . Influenza Split 03/29/2012  . Influenza,inj,Quad PF,6+ Mos 03/17/2016  . Td 08/15/2007     Past Medical History:  Diagnosis Date  . Genital warts   . Hyperlipidemia   . PUD (peptic ulcer disease)    Allergies  Allergen Reactions  . Statins Other (See Comments)    mylagia   Past  Surgical History:  Procedure Laterality Date  . VASECTOMY  2010   Family History  Problem Relation Age of Onset  . Lung cancer Father   . Asthma Brother   . Diabetes Maternal Grandfather   . Diabetes Maternal Uncle   . Hyperlipidemia Maternal Grandmother    Social History   Socioeconomic History  . Marital status: Married    Spouse name: Not on file  . Number of children: 1  . Years of education: 36  . Highest education level: Not on file  Occupational History  . Occupation: truck Animator Needs  . Financial resource strain: Not on file  . Food insecurity:    Worry: Not on file    Inability: Not on file  . Transportation needs:    Medical: Not on file    Non-medical: Not on file  Tobacco Use  . Smoking status: Former Smoker    Last attempt to quit: 06/06/1997    Years since quitting: 20.8  . Smokeless tobacco: Never Used  Substance and Sexual Activity  . Alcohol use: Yes    Comment: socially  . Drug use: No  . Sexual activity: Yes    Partners: Female    Comment: married  Lifestyle  . Physical activity:    Days per week: Not on file    Minutes per session: Not on file  . Stress: Not on file  Relationships  . Social connections:    Talks on phone: Not on file    Gets  together: Not on file    Attends religious service: Not on file    Active member of club or organization: Not on file    Attends meetings of clubs or organizations: Not on file    Relationship status: Not on file  . Intimate partner violence:    Fear of current or ex partner: Not on file    Emotionally abused: Not on file    Physically abused: Not on file    Forced sexual activity: Not on file  Other Topics Concern  . Not on file  Social History Narrative   Married. 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    Wears seat belt. Exercises 3x or greater a week.    Former Smoker. Quit 1999, Occasional ETOH, no recreational drugs.    Drinks caffeine  beverages. Uses herbal remedies. Takes a multivitamin.    Smoker alarm in the home.    Feels safe in his relationships.    Allergies as of 04/13/2018      Reactions   Statins Other (See Comments)   mylagia      Medication List        Accurate as of 04/13/18  8:42 AM. Always use your most recent med list.          colesevelam 625 MG tablet Commonly known as:  WELCHOL Take 1,250 mg by mouth 2 (two) times daily with a meal.   cyclobenzaprine 10 MG tablet Commonly known as:  FLEXERIL Take 1 tablet (10 mg total) by mouth 2 (two) times daily.   MULTIVITAMIN ADULT Chew Chew by mouth.   naproxen 500 MG tablet Commonly known as:  NAPROSYN Take 1 tablet (500 mg total) by mouth 2 (two) times daily with a meal.      All past medical history, surgical history, allergies, family history, immunizations andmedications were updated in the EMR today and reviewed under the history and medication portions of their EMR.     Recent Results (from the past 2160 hour(s))  Urinalysis, Routine w reflex microscopic     Status: None   Collection Time: 04/04/18  4:19 PM  Result Value Ref Range   Color, Urine YELLOW YELLOW   APPearance CLEAR CLEAR   Specific Gravity, Urine 1.011 1.001 - 1.03   pH 6.0 5.0 - 8.0   Glucose, UA NEGATIVE NEGATIVE   Bilirubin Urine NEGATIVE NEGATIVE   Ketones, ur NEGATIVE NEGATIVE   Hgb urine dipstick NEGATIVE NEGATIVE   Protein, ur NEGATIVE NEGATIVE   Nitrite NEGATIVE NEGATIVE   Leukocytes, UA NEGATIVE NEGATIVE  Urine Culture     Status: None   Collection Time: 04/04/18  4:19 PM  Result Value Ref Range   MICRO NUMBER: [id number]    SPECIMEN QUALITY: ADEQUATE    Sample Source NOT GIVEN    STATUS: FINAL    Result: No Growth   CBC w/Diff     Status: Abnormal   Collection Time: 04/04/18  4:19 PM  Result Value Ref Range   WBC 3.7 (L) 4.0 - 10.5 K/uL   RBC 5.30 4.22 - 5.81 Mil/uL   Hemoglobin 14.8 13.0 - 17.0 g/dL   HCT 45.3 39.0 - 52.0 %   MCV 85.5 78.0 - 100.0  fl   MCHC 32.6 30.0 - 36.0 g/dL   RDW 13.9 11.5 - 15.5 %   Platelets 202.0 150.0 - 400.0 K/uL   Neutrophils Relative % 33.7 (L) 43.0 - 77.0 %   Lymphocytes  Relative 54.9 (H) 12.0 - 46.0 %   Monocytes Relative 5.7 3.0 - 12.0 %   Eosinophils Relative 5.0 0.0 - 5.0 %   Basophils Relative 0.7 0.0 - 3.0 %   Neutro Abs 1.2 (L) 1.4 - 7.7 K/uL   Lymphs Abs 2.0 0.7 - 4.0 K/uL   Monocytes Absolute 0.2 0.1 - 1.0 K/uL   Eosinophils Absolute 0.2 0.0 - 0.7 K/uL   Basophils Absolute 0.0 0.0 - 0.1 K/uL    No results found.   ROS: 14 pt review of systems performed and negative (unless mentioned in an HPI)  Objective: BP 128/80 (BP Location: Left Arm, Patient Position: Sitting, Cuff Size: Large)   Pulse 63   Temp 98.4 F (36.9 C)   Resp 20   Ht 6' 1"  (1.854 m)   Wt 223 lb (101.2 kg)   SpO2 97%   BMI 29.42 kg/m  Gen: Afebrile. No acute distress. Nontoxic in appearance, well-developed, well-nourished,  Pleasant [name].  HENT: AT. [place]. Bilateral TM visualized and normal in appearance, normal external auditory canal. MMM, no oral lesions, adequate dentition. Bilateral nares within normal limits. Throat without erythema, ulcerations or exudates. no Cough on exam, no hoarseness on exam. Eyes:Pupils Equal Round Reactive to light, Extraocular movements intact,  Conjunctiva without redness, discharge or icterus. Neck/lymp/endocrine: Supple,no lymphadenopathy, no thyromegaly CV: RRR no murmur, no edema, +2/4 P posterior tibialis pulses. no carotid bruits. No JVD. Chest: CTAB, no wheeze, rhonchi or crackles. Normal  Respiratory effort. good Air movement. Abd: Soft. flat. NTND. BS presetn. no Masses palpated. No hepatosplenomegaly. No rebound tenderness or guarding. Skin: no rashes, purpura or petechiae. Warm and well-perfused. Skin intact. Neuro/Msk:  Normal gait. PERLA. EOMi. Alert. Oriented x3.  Cranial nerves II through XII intact. Muscle strength 5/5 upper/lower extremity. DTRs equal bilaterally. Psych:  Normal affect, dress and demeanor. Normal speech. Normal thought content and judgment.  No exam data present  Assessment/plan: Russell Ortiz is a 47 y.o. male present for CPE Hyperlipidemia, unspecified hyperlipidemia type/Overweight (BMI 25.0-29.9) Continue welchol. Diet. Exercise.  - Comp Met (CMET) - Lipid panel Elevated hemoglobin A1c - HgB A1c Prostate cancer screening - [name].  - PSA Colon cancer screening [name], w/ fhc colon cancer in p-uncle - Ambulatory referral to Gastroenterology Encounter for preventative adult health care examination Patient was encouraged to exercise greater than 150 minutes a week. Patient was encouraged to choose a diet filled with fresh fruits and vegetables, and lean meats. AVS provided to patient today for education/recommendation on gender specific health and safety maintenance. Colonoscopy: [name] with a P-uncle colon cancer at 65. Early screening may be warranted. Referral placed to discuss.  Immunizations:  tdap updated today, influenza updated today Infectious disease screening: HIV 2016 PSA collected  Return in about 1 year (around 04/14/2019) for CPE.  Note is dictated utilizing voice recognition software. Although note has been proof read prior to signing, occasional typographical errors still can be missed. If any questions arise, please do not hesitate to call for verification.  Electronically signed by: Howard Pouch, DO [place]

## 2018-04-16 ENCOUNTER — Telehealth: Payer: Self-pay | Admitting: Family Medicine

## 2018-04-16 NOTE — Telephone Encounter (Signed)
Please inform patient the following information: His [place] was normal- no masses or concerns of his thigh area or scrotal area. Discomfort hopefully from a strained muscle of the thigh. Continue the naproxen BID PRN. Heat application to thigh can also be helpful. If symptoms do not resolve on 4 weeks follow up.  His labs are stable, with the exception of his cholesterol. Is he taking the welchol? I have refilled it. It is not a statin (which he can not tolerate). If he can not take the welchol there is another option called zeita, also not a statin that can be tried. Please advise.

## 2018-04-16 NOTE — Telephone Encounter (Signed)
Spoke with patient reviewed [place] and lab results. Patient states he is taking the Welchol but doesn't always take it everyday advised patient to take as directed if any problems with medication he will let us know.

## 2018-04-17 ENCOUNTER — Ambulatory Visit: Payer: 59 | Admitting: *Deleted

## 2018-04-19 ENCOUNTER — Encounter: Payer: Self-pay | Admitting: *Deleted

## 2018-04-20 ENCOUNTER — Encounter: Payer: Self-pay | Admitting: Internal Medicine

## 2018-04-20 DIAGNOSIS — M5412 Radiculopathy, cervical region: Secondary | ICD-10-CM | POA: Insufficient documentation

## 2018-04-20 DIAGNOSIS — M542 Cervicalgia: Secondary | ICD-10-CM | POA: Diagnosis not present

## 2018-04-24 ENCOUNTER — Encounter: Payer: Self-pay | Admitting: Physical Therapy

## 2018-04-26 ENCOUNTER — Ambulatory Visit: Payer: 59 | Admitting: *Deleted

## 2018-04-26 DIAGNOSIS — M5412 Radiculopathy, cervical region: Secondary | ICD-10-CM

## 2018-04-26 DIAGNOSIS — M542 Cervicalgia: Secondary | ICD-10-CM | POA: Diagnosis not present

## 2018-04-26 NOTE — Therapy (Addendum)
[facility] [place], [place], [address] Phone: [phone]   Fax:  [phone]  Physical Therapy Treatment  PHYSICAL THERAPY DISCHARGE SUMMARY  Visits from Start of Care: 5  Current functional level related to goals / functional outcomes: See below   Remaining deficits: See goals   Education / Equipment: HEP Plan: Patient agrees to discharge.  Patient goals were not met. Patient is being discharged due to not returning since the last visit.  ?????  Gabriela Eves, PT, DPT 03/23/20   Patient Details  Name: Russell Ortiz MRN: [id number] Date of Birth: Mar 04, 1971 Referring Provider (PT): Jenetta Loges, [place]   Encounter Date: 04/26/2018  PT End of Session - 04/26/18 1651    Visit Number  5    Number of Visits  8    Date for PT Re-Evaluation  04/24/18    PT Start Time  4967    PT Stop Time  5916   only 2 units due to Pt needing to leave   PT Time Calculation (min)  36 min       Past Medical History:  Diagnosis Date  . Genital warts   . Hyperlipidemia   . PUD (peptic ulcer disease)     Past Surgical History:  Procedure Laterality Date  . VASECTOMY  2010    There were no vitals filed for this visit.  Subjective Assessment - 04/26/18 1602    Subjective  Went to MD last Friday. MRI on Monday.    Limitations  Sitting;House hold activities    Diagnostic tests  x-ray    Patient Stated Goals  stop tingling and get back to normal    Currently in Pain?  Yes    Pain Score  2     Pain Location  Neck    Pain Orientation  Left    Pain Descriptors / Indicators  Sore    Pain Type  Acute pain    Pain Onset  More than a month ago                       [facility] Adult PT Treatment/Exercise - 04/26/18 0001      Modalities   Modalities  Electrical Stimulation;Moist Heat;Ultrasound;Traction      Ultrasound   Ultrasound Location  LT side cerv paras    Ultrasound Parameters  1.5 w/cm2 x 12 mins     Ultrasound Goals  Pain      Traction   Type of Traction  Cervical    Min (lbs)  5    Max (lbs)  17    Hold Time  99    Rest Time  5    Time  14                  PT Long Term Goals - 04/26/18 1647      PT LONG TERM GOAL #1   Title  Patient will be independent with HEP    Period  Weeks    Status  On-going      PT LONG TERM GOAL #2   Title  Patient will report no neurological symptoms down left UE to indicate no nerve irritation.    Time  4    Period  Weeks    Status  Partially Met      PT LONG TERM GOAL #3   Title  Patient will report ability to sleep for 5 hours or greater with cervical pain less than  3/10.    Time  4    Period  Weeks    Status  On-going      PT LONG TERM GOAL #4   Title  Patient will demonstrate 60+lb of grip for or equal to right to improve strength for grip activities.     Time  4    Period  Weeks    Status  Achieved   60#s 04-26-18           Plan - 04/26/18 1646    Clinical Impression Statement  Pt arrived today after missing 3 weeks due to conflicts and reports He saw MD last week and is having an MRI on Monday for c-spine. [place] combo and Traction were performed today and tolerated well. Traction was performed at 17#s  and tolerated well. His grip strength had improved to 60#s and LTG was met. Other LTGs are ongoing.    Clinical Presentation  Evolving    Clinical Decision Making  Low    Rehab Potential  Good    PT Frequency  2x / week    PT Duration  4 weeks    PT Treatment/Interventions  ADLs/Self Care Home Management;Cryotherapy;Electrical Stimulation;Traction;Moist Heat;Iontophoresis 31m/ml Dexamethasone;Neuromuscular re-education;Passive range of motion;Manual techniques;Patient/family education;Therapeutic exercise;Therapeutic activities;Ultrasound;Dry needling    PT Next Visit Plan  Continue with therex and assess possible tracion.     PT Home Exercise Plan  see patient education section     Consulted and Agree with Plan of  Care  Patient       Patient will benefit from skilled therapeutic intervention in order to improve the following deficits and impairments:  Pain, Decreased activity tolerance, Decreased endurance, Decreased range of motion, Decreased strength, Postural dysfunction, Impaired UE functional use  Visit Diagnosis: Cervicalgia  Radiculopathy, cervical region     Problem List Patient Active Problem List   Diagnosis Date Noted  . Mass of left thigh 04/06/2018  . Neutropenia ([place] 04/12/2017  . Overweight (BMI 25.0-29.9) 11/28/2016  . Elevated hemoglobin A1c 08/25/2016  . Elevated liver function tests 03/12/2015  . Hyperlipemia 03/12/2015    RAMSEUR,CHRIS, PTA 04/26/2018, 6:11 PM  [facility] [address]Phone: [phone]  Fax:  [phone] Name: STRAVIN MARIKMRN: [id number] of Birth: 118-Nov-1972

## 2018-05-01 ENCOUNTER — Encounter: Payer: Self-pay | Admitting: Physical Therapy

## 2018-05-17 ENCOUNTER — Encounter: Payer: Self-pay | Admitting: Internal Medicine

## 2018-05-17 ENCOUNTER — Ambulatory Visit (AMBULATORY_SURGERY_CENTER): Payer: 59 | Admitting: *Deleted

## 2018-05-17 VITALS — Ht 73.0 in | Wt 220.0 lb

## 2018-05-17 DIAGNOSIS — Z1211 Encounter for screening for malignant neoplasm of colon: Secondary | ICD-10-CM

## 2018-05-17 MED ORDER — NA SULFATE-K SULFATE-MG SULF 17.5-3.13-1.6 GM/177ML PO SOLN: ORAL | 0 refills | Status: DC

## 2018-05-17 NOTE — Progress Notes (Signed)
Patient denies any allergies to eggs or soy. Patient denies any problems with anesthesia/sedation. Patient denies any oxygen use at home. Patient denies taking any diet/weight loss medications or blood thinners. EMMI education assisgned to patient on colonoscopy, this was explained and instructions given to patient. 

## 2018-05-29 ENCOUNTER — Telehealth: Payer: Self-pay | Admitting: Internal Medicine

## 2018-05-29 NOTE — Telephone Encounter (Signed)
PT called back about ordering the suprep

## 2018-05-29 NOTE — Telephone Encounter (Signed)
Pt states that suprep is not covered by his insurance, wants to know if he can have something different. His proc is on Friday.

## 2018-05-31 NOTE — Telephone Encounter (Signed)
Pt states that he has already paid $95 for the [employer]

## 2018-06-01 ENCOUNTER — Encounter: Payer: Self-pay | Admitting: Internal Medicine

## 2018-06-01 ENCOUNTER — Ambulatory Visit (AMBULATORY_SURGERY_CENTER): Payer: 59 | Admitting: Internal Medicine

## 2018-06-01 VITALS — BP 146/96 | HR 68 | Temp 97.1°F | Resp 16 | Ht 73.0 in | Wt 220.0 lb

## 2018-06-01 DIAGNOSIS — Z1211 Encounter for screening for malignant neoplasm of colon: Secondary | ICD-10-CM

## 2018-06-01 DIAGNOSIS — D120 Benign neoplasm of cecum: Secondary | ICD-10-CM | POA: Diagnosis not present

## 2018-06-01 DIAGNOSIS — D125 Benign neoplasm of sigmoid colon: Secondary | ICD-10-CM | POA: Diagnosis not present

## 2018-06-01 DIAGNOSIS — K635 Polyp of colon: Secondary | ICD-10-CM | POA: Diagnosis not present

## 2018-06-01 DIAGNOSIS — D123 Benign neoplasm of transverse colon: Secondary | ICD-10-CM

## 2018-06-01 MED ORDER — SODIUM CHLORIDE 0.9 % IV SOLN: 500.0000 mL | Freq: Once | INTRAVENOUS | Status: DC

## 2018-06-01 NOTE — Progress Notes (Signed)
Called to room to assist during endoscopic procedure.  Patient ID and intended procedure confirmed with present staff. Received instructions for my participation in the procedure from the performing physician.  

## 2018-06-01 NOTE — Patient Instructions (Signed)
Handout on polyps given   YOU HAD AN ENDOSCOPIC PROCEDURE TODAY AT THE [facility]:   Refer to the procedure report that was given to you for any specific questions about what was found during the examination.  If the procedure report does not answer your questions, please call your gastroenterologist to clarify.  If you requested that your care partner not be given the details of your procedure findings, then the procedure report has been included in a sealed envelope for you to review at your convenience later.  YOU SHOULD EXPECT: Some feelings of bloating in the abdomen. Passage of more gas than usual.  Walking can help get rid of the air that was put into your GI tract during the procedure and reduce the bloating. If you had a lower endoscopy (such as a colonoscopy or flexible sigmoidoscopy) you may notice spotting of blood in your stool or on the toilet paper. If you underwent a bowel prep for your procedure, you may not have a normal bowel movement for a few days.  Please Note:  You might notice some irritation and congestion in your nose or some drainage.  This is from the oxygen used during your procedure.  There is no need for concern and it should clear up in a day or so.  SYMPTOMS TO REPORT IMMEDIATELY:   Following lower endoscopy (colonoscopy or flexible sigmoidoscopy):  Excessive amounts of blood in the stool  Significant tenderness or worsening of abdominal pains  Swelling of the abdomen that is new, acute  Fever of 100F or higher   For urgent or emergent issues, a gastroenterologist can be reached at any hour by calling [phone].   DIET:  We do recommend a small meal at first, but then you may proceed to your regular diet.  Drink plenty of fluids but you should avoid alcoholic beverages for 24 hours.  ACTIVITY:  You should plan to take it easy for the rest of today and you should NOT DRIVE or use heavy machinery until tomorrow (because of the sedation  medicines used during the test).    FOLLOW UP: Our staff will call the number listed on your records the next business day following your procedure to check on you and address any questions or concerns that you may have regarding the information given to you following your procedure. If we do not reach you, we will leave a message.  However, if you are feeling well and you are not experiencing any problems, there is no need to return our call.  We will assume that you have returned to your regular daily activities without incident.  If any biopsies were taken you will be contacted by phone or by letter within the next 1-3 weeks.  Please call us at [phone] if you have not heard about the biopsies in 3 weeks.    SIGNATURES/CONFIDENTIALITY: You and/or your care partner have signed paperwork which will be entered into your electronic medical record.  These signatures attest to the fact that that the information above on your After Visit Summary has been reviewed and is understood.  Full responsibility of the confidentiality of this discharge information lies with you and/or your care-partner. 

## 2018-06-01 NOTE — Op Note (Signed)
[facility] Patient Name: Russell Ortiz Procedure Date: 06/01/2018 10:23 AM MRN: [id number] Endoscopist: Jerene Bears , MD Age: 47 Referring MD:  Date of Birth: 1971-01-24 Gender: Male Account #: [id number] Procedure:                Colonoscopy Indications:              Screening for colorectal malignant neoplasm, This                            is the patient's first colonoscopy Medicines:                Monitored Anesthesia Care Procedure:                Pre-Anesthesia Assessment:                           - Prior to the procedure, a History and Physical                            was performed, and patient medications and                            allergies were reviewed. The patient's tolerance of                            previous anesthesia was also reviewed. The risks                            and benefits of the procedure and the sedation                            options and risks were discussed with the patient.                            All questions were answered, and informed consent                            was obtained. Prior Anticoagulants: The patient has                            taken no previous anticoagulant or antiplatelet                            agents. ASA Grade Assessment: II - A patient with                            mild systemic disease. After reviewing the risks                            and benefits, the patient was deemed in                            satisfactory condition to undergo the procedure.  After obtaining informed consent, the colonoscope                            was passed under direct vision. Throughout the                            procedure, the patient's blood pressure, pulse, and                            oxygen saturations were monitored continuously. The                            Colonoscope was introduced through the anus and                            advanced to the terminal ileum.  The colonoscopy was                            performed without difficulty. The patient tolerated                            the procedure well. The quality of the bowel                            preparation was good. The terminal ileum, ileocecal                            valve, appendiceal orifice, and rectum were                            photographed. Scope In: 10:29:02 AM Scope Out: 10:44:34 AM Scope Withdrawal Time: 0 hours 12 minutes 19 seconds  Total Procedure Duration: 0 hours 15 minutes 32 seconds  Findings:                 The digital rectal exam was normal.                           The terminal ileum appeared normal.                           Three sessile polyps were found in the sigmoid                            colon, transverse colon and cecum. The polyps were                            3 to 4 mm in size. These polyps were removed with a                            cold snare. Resection and retrieval were complete.                           The exam was otherwise without abnormality on  direct and retroflexion views. Complications:            No immediate complications. Estimated Blood Loss:     Estimated blood loss was minimal. Impression:               - The examined portion of the ileum was normal.                           - Three 3 to 4 mm polyps in the sigmoid colon, in                            the transverse colon and in the cecum, removed with                            a cold snare. Resected and retrieved.                           - The examination was otherwise normal on direct                            and retroflexion views. Recommendation:           - Patient has a contact number available for                            emergencies. The signs and symptoms of potential                            delayed complications were discussed with the                            patient. Return to normal activities tomorrow.                             Written discharge instructions were provided to the                            patient.                           - Resume previous diet.                           - Continue present medications.                           - Await pathology results.                           - Repeat colonoscopy is recommended. The                            colonoscopy date will be determined after pathology                            results from today's exam become available for  review. Jerene Bears, MD 06/01/2018 10:48:31 AM This report has been signed electronically.

## 2018-06-01 NOTE — Progress Notes (Signed)
PT taken to PACU. Monitors in place. VSS. Report given to RN. 

## 2018-06-04 ENCOUNTER — Telehealth: Payer: Self-pay

## 2018-06-04 NOTE — Telephone Encounter (Signed)
  Follow up Call-  Call back number 06/01/2018  Post procedure Call Back phone  # [phone]  Permission to leave phone message Yes  Some recent data might be hidden     Patient questions:  Do you have a fever, pain , or abdominal swelling? No. Pain Score  0 *  Have you tolerated food without any problems? Yes.    Have you been able to return to your normal activities? Yes.    Do you have any questions about your discharge instructions: Diet   No. Medications  No. Follow up visit  No.  Do you have questions or concerns about your Care? No.  Actions: * If pain score is 4 or above: No action needed, pain <4.

## 2018-06-12 ENCOUNTER — Encounter: Payer: Self-pay | Admitting: Internal Medicine

## 2018-11-28 DIAGNOSIS — N481 Balanitis: Secondary | ICD-10-CM | POA: Diagnosis not present

## 2018-11-28 DIAGNOSIS — F524 Premature ejaculation: Secondary | ICD-10-CM | POA: Diagnosis not present

## 2018-12-29 ENCOUNTER — Encounter: Payer: Self-pay | Admitting: Family Medicine

## 2018-12-29 ENCOUNTER — Other Ambulatory Visit: Payer: Self-pay

## 2018-12-29 ENCOUNTER — Ambulatory Visit (INDEPENDENT_AMBULATORY_CARE_PROVIDER_SITE_OTHER): Payer: 59 | Admitting: Family Medicine

## 2018-12-29 DIAGNOSIS — L259 Unspecified contact dermatitis, unspecified cause: Secondary | ICD-10-CM | POA: Diagnosis not present

## 2018-12-29 MED ORDER — PREDNISONE 10 MG PO TABS: ORAL_TABLET | ORAL | 0 refills | Status: DC

## 2018-12-29 NOTE — Progress Notes (Signed)
Virtual Visit via Video Note  I connected with Russell Ortiz on 12/29/18 at 11:00 AM EDT by a video enabled telemedicine application and verified that I am speaking with the correct person using two identifiers.  Location patient: home Location provider:work or home office Persons participating in the virtual visit: patient, provider  I discussed the limitations of evaluation and management by telemedicine and the availability of in person appointments. The patient expressed understanding and agreed to proceed.  Telemedicine visit is a necessity given the COVID-19 restrictions in place at the current time.  HPI: 48 y/o [name] being seen today for rash. Fine bumpy rash on wrists, slightly pinkish bumps, extending now up both forearms and tops of hands.  None in web spaces of fingers.  No other areas of body with similar rash. Otherwise feeling fine. Has had similar rash about 2 yrs ago.  No known trigger. He applied hydrocortisone and says this made it worse.  Witch hazel cooled it off. No one else in home with similar rash.  No hives. No known allergies.  No recent new meds, foods, etc.  ROS: See pertinent positives and negatives per HPI.  Past Medical History:  Diagnosis Date  . Genital warts   . Hyperlipidemia   . PUD (peptic ulcer disease)     Past Surgical History:  Procedure Laterality Date  . VASECTOMY  2010    Family History  Problem Relation Age of Onset  . Lung cancer Father   . Asthma Brother   . Diabetes Maternal Grandfather   . Diabetes Maternal Uncle   . Hyperlipidemia Maternal Grandmother   . Colon cancer Paternal Uncle   . Prostate cancer Paternal Uncle   . Stomach cancer Neg Hx   . Esophageal cancer Neg Hx   . Colon polyps Neg Hx      Current Outpatient Medications:  .  Alpha-Lipoic Acid (ALPHA BETIC PO), Take by mouth., Disp: , Rfl:  .  Flaxseed, Linseed, (FLAX SEED OIL PO), Take 1,000 mg by mouth daily., Disp: , Rfl:  .  Multiple Vitamins-Minerals (MULTIVITAMIN  ADULT) CHEW, Chew by mouth., Disp: , Rfl:   EXAM:  VITALS per patient if applicable: There were no vitals taken for this visit.  SKIN: I can see a faint pinkish-to-flesh colored fine papular rash scattered over Russell Ortiz's dorsal surface of hands, wrists, and forearms.  No pustules, vesicles, or hives.    GENERAL: alert, oriented, appears well and in no acute distress  HEENT: atraumatic, conjunttiva clear, no obvious abnormalities on inspection of external nose and ears  NECK: normal movements of the head and neck  LUNGS: on inspection no signs of respiratory distress, breathing rate appears normal, no obvious gross SOB, gasping or wheezing  CV: no obvious cyanosis  MS: moves all visible extremities without noticeable abnormality  PSYCH/NEURO: pleasant and cooperative, no obvious depression or anxiety, speech and thought processing grossly intact  LABS: none today    Chemistry      Component Value Date/Time   NA 140 04/13/2018 0841   K 4.4 04/13/2018 0841   CL 105 04/13/2018 0841   CO2 31 04/13/2018 0841   BUN 9 04/13/2018 0841   CREATININE 1.08 04/13/2018 0841   CREATININE 1.12 08/25/2016 0925      Component Value Date/Time   CALCIUM 10.1 04/13/2018 0841   ALKPHOS 44 04/13/2018 0841   AST 50 (H) 04/13/2018 0841   ALT 36 04/13/2018 0841   BILITOT 0.7 04/13/2018 0841      ASSESSMENT AND  PLAN:  Discussed the following assessment and plan:  Nonspecific dermatitis. Russell Ortiz to keep a lookout for possible contact irritant/allergen that this may be coming from. No sign of infection. Treat with daily xyzal and benadryl. Will eRx prednisone 40 mg qd x 3d, 20mg  qd x 3d, then 10mg  qd x 3d.    I discussed the assessment and treatment plan with the patient. The patient was provided an opportunity to ask questions and all were answered. The patient agreed with the plan and demonstrated an understanding of the instructions.   The patient was advised to call back or seek an in-person  evaluation if the symptoms worsen or if the condition fails to improve as anticipated.  F/u: if not improving.  Signed:  Crissie Sickles, MD           12/29/2018

## 2019-01-14 DIAGNOSIS — N481 Balanitis: Secondary | ICD-10-CM | POA: Diagnosis not present

## 2019-03-04 ENCOUNTER — Other Ambulatory Visit: Payer: Self-pay

## 2019-03-04 DIAGNOSIS — Z20822 Contact with and (suspected) exposure to covid-19: Secondary | ICD-10-CM

## 2019-03-05 LAB — NOVEL CORONAVIRUS, NAA: SARS-CoV-2, NAA: NOT DETECTED

## 2019-04-19 ENCOUNTER — Encounter: Payer: 59 | Admitting: Family Medicine

## 2019-04-22 ENCOUNTER — Encounter: Payer: 59 | Admitting: Family Medicine

## 2019-04-22 ENCOUNTER — Other Ambulatory Visit: Payer: Self-pay

## 2019-04-22 ENCOUNTER — Ambulatory Visit (INDEPENDENT_AMBULATORY_CARE_PROVIDER_SITE_OTHER): Payer: 59 | Admitting: Family Medicine

## 2019-04-22 ENCOUNTER — Encounter: Payer: Self-pay | Admitting: Family Medicine

## 2019-04-22 VITALS — BP 138/90 | HR 75 | Temp 98.8°F | Resp 17 | Ht 73.25 in | Wt 227.1 lb

## 2019-04-22 DIAGNOSIS — E663 Overweight: Secondary | ICD-10-CM | POA: Diagnosis not present

## 2019-04-22 DIAGNOSIS — E785 Hyperlipidemia, unspecified: Secondary | ICD-10-CM

## 2019-04-22 DIAGNOSIS — R7309 Other abnormal glucose: Secondary | ICD-10-CM

## 2019-04-22 DIAGNOSIS — Z0000 Encounter for general adult medical examination without abnormal findings: Secondary | ICD-10-CM | POA: Diagnosis not present

## 2019-04-22 DIAGNOSIS — R7989 Other specified abnormal findings of blood chemistry: Secondary | ICD-10-CM | POA: Diagnosis not present

## 2019-04-22 DIAGNOSIS — Z23 Encounter for immunization: Secondary | ICD-10-CM

## 2019-04-22 DIAGNOSIS — Z125 Encounter for screening for malignant neoplasm of prostate: Secondary | ICD-10-CM

## 2019-04-22 LAB — COMPREHENSIVE METABOLIC PANEL
ALT: 35 U/L (ref 0–53)
AST: 45 U/L — ABNORMAL HIGH (ref 0–37)
Albumin: 4.6 g/dL (ref 3.5–5.2)
Alkaline Phosphatase: 49 U/L (ref 39–117)
BUN: 10 mg/dL (ref 6–23)
CO2: 27 mEq/L (ref 19–32)
Calcium: 9.6 mg/dL (ref 8.4–10.5)
Chloride: 105 mEq/L (ref 96–112)
Creatinine, Ser: 1.04 mg/dL (ref 0.40–1.50)
GFR: 92.61 mL/min (ref >60.00)
Glucose, Bld: 98 mg/dL (ref 70–99)
Potassium: 4.1 mEq/L (ref 3.5–5.1)
Sodium: 140 mEq/L (ref 135–145)
Total Bilirubin: 0.6 mg/dL (ref 0.2–1.2)
Total Protein: 7.2 g/dL (ref 6.0–8.3)

## 2019-04-22 LAB — LIPID PANEL
Cholesterol: 279 mg/dL — ABNORMAL HIGH (ref 0–200)
HDL: 55.8 mg/dL (ref >39.00)
LDL Cholesterol: 194 mg/dL — ABNORMAL HIGH (ref 0–99)
NonHDL: 223.35
Total CHOL/HDL Ratio: 5
Triglycerides: 145 mg/dL (ref 0.0–149.0)
VLDL: 29 mg/dL (ref 0.0–40.0)

## 2019-04-22 LAB — CBC
HCT: 47 % (ref 39.0–52.0)
Hemoglobin: 15.2 g/dL (ref 13.0–17.0)
MCHC: 32.5 g/dL (ref 30.0–36.0)
MCV: 87.2 fl (ref 78.0–100.0)
Platelets: 182 10*3/uL (ref 150.0–400.0)
RBC: 5.38 Mil/uL (ref 4.22–5.81)
RDW: 13.7 % (ref 11.5–15.5)
WBC: 3.1 10*3/uL — ABNORMAL LOW (ref 4.0–10.5)

## 2019-04-22 LAB — TSH: TSH: 3.15 u[IU]/mL (ref 0.35–4.50)

## 2019-04-22 LAB — PSA: PSA: 0.24 ng/mL (ref 0.10–4.00)

## 2019-04-22 LAB — HEMOGLOBIN A1C: Hgb A1c MFr Bld: 5.9 % (ref 4.6–6.5)

## 2019-04-22 MED ORDER — HYDROCORTISONE 1 % EX OINT: 1.0000 "application " | TOPICAL_OINTMENT | Freq: Two times a day (BID) | CUTANEOUS | 2 refills | Status: DC

## 2019-04-22 NOTE — Progress Notes (Signed)
Patient ID: Russell Ortiz, male  DOB: 1971-05-05, 48 y.o.   MRN: [id number] Patient Care Team    Relationship Specialty Notifications Start End  Ma Hillock, DO PCP - General Family Medicine  08/17/15   Pyrtle, Lajuan Lines, MD Consulting Physician Gastroenterology  04/22/19     Chief Complaint  Patient presents with  . Annual Exam    Fasting.     Subjective:  Russell Ortiz is a 48 y.o. male present for CPE. All past medical history, surgical history, allergies, family history, immunizations, medications and social history were updated in the electronic medical record today. All recent labs, ED visits and hospitalizations within the last year were reviewed.  Health maintenance:  Colonoscopy: [name] with a P-uncle colon cancer at 32. Completed 06/01/2018, Dr. Hilarie Fredrickson 5 yr follow up.  Immunizations:  tdap UTD 04/2018, influenza provided  today. Infectious disease screening: HIV 2016 PSA:  Lab Results  Component Value Date   PSA 0.30 04/13/2018   PSA 0.28 04/12/2017   PSA 0.21 03/17/2016  , pt was counseled on prostate cancer screenings.  Assistive device: none Oxygen [id number] Patient has a Dental home. Hospitalizations/ED visits: reviewed  Depression screen [facility] 2/9 04/22/2019 04/13/2018 09/14/2017 04/12/2017 03/17/2016  Decreased Interest 0 0 0 0 0  Down, Depressed, Hopeless 0 0 0 0 0  PHQ - 2 Score 0 0 0 0 0   No flowsheet data found.     Fall Risk  09/14/2017 03/17/2016  Falls in the past year? No No    Immunization History  Administered Date(s) Administered  . Influenza Split 03/29/2012  . Influenza,inj,Quad PF,6+ Mos 03/17/2016, 04/13/2018  . Td 08/15/2007  . Tdap 04/13/2018     Past Medical History:  Diagnosis Date  . Genital warts   . Hyperlipidemia   . PUD (peptic ulcer disease)    Allergies  Allergen Reactions  . Statins Other (See Comments)    mylagia   Past Surgical History:  Procedure Laterality Date  . VASECTOMY  2010   Family History  Problem  Relation Age of Onset  . Lung cancer Father   . Asthma Brother   . Diabetes Maternal Grandfather   . Diabetes Maternal Uncle   . Hyperlipidemia Maternal Grandmother   . Colon cancer Paternal Uncle   . Prostate cancer Paternal Uncle   . Stomach cancer Neg Hx   . Esophageal cancer Neg Hx   . Colon polyps Neg Hx    Social History   Social History Narrative   Married. 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    Wears seat belt. Exercises 3x or greater a week.    Former Smoker. Quit 1999, Occasional ETOH, no recreational drugs.    Drinks caffeine beverages. Uses herbal remedies. Takes a multivitamin.    Smoker alarm in the home.    Feels safe in his relationships.     Allergies as of 04/22/2019      Reactions   Statins Other (See Comments)   mylagia      Medication List       Accurate as of April 22, 2019  9:41 AM. If you have any questions, ask your nurse or doctor.        STOP taking these medications   ALPHA BETIC PO Stopped by: Howard Pouch, DO   predniSONE 10 MG tablet Commonly known as: DELTASONE Stopped by: Howard Pouch, DO     TAKE these  medications   FLAX SEED OIL PO Take 1,000 mg by mouth daily.   Multivitamin Adult Chew Chew by mouth.      All past medical history, surgical history, allergies, family history, immunizations andmedications were updated in the EMR today and reviewed under the history and medication portions of their EMR.      ROS: 14 pt review of systems performed and negative (unless mentioned in an HPI)  Objective: BP 138/90 (BP Location: Left Arm, Patient Position: Sitting, Cuff Size: Normal)   Pulse 75   Temp 98.8 F (37.1 C) (Temporal)   Resp 17   Ht 6' 1.25" (1.861 m)   Wt 227 lb 2 oz (103 kg)   SpO2 96%   BMI 29.76 kg/m  Gen: Afebrile. No acute distress. Nontoxic in appearance, well-developed, well-nourished,  Very pleasant [name].  HENT: AT. [place]. Bilateral TM visualized and  normal in appearance, normal external auditory canal. MMM, no oral lesions, adequate dentition. Bilateral nares within normal limits. Throat without erythema, ulcerations or exudates. no Cough on exam, no hoarseness on exam. Eyes:Pupils Equal Round Reactive to light, Extraocular movements intact,  Conjunctiva without redness, discharge or icterus. Neck/lymp/endocrine: Supple,no lymphadenopathy, no thyromegaly CV: RRR no murmur, no edema, +2/4 P posterior tibialis pulses. no carotid bruits. No JVD. Chest: CTAB, no wheeze, rhonchi or crackles. normal Respiratory effort. good Air movement. Abd: Soft. flat. NTND. BS present. no Masses palpated. No hepatosplenomegaly. No rebound tenderness or guarding. Skin: no rashes, purpura or petechiae. Warm and well-perfused. Skin intact. Mild dry flaky skin left temporal hairline.  Neuro/Msk:  Normal gait. PERLA. EOMi. Alert. Oriented x3.  Cranial nerves II through XII intact. Muscle strength 5/5 upper/lower extremity. DTRs equal bilaterally. Psych: Normal affect, dress and demeanor. Normal speech. Normal thought content and judgment.  No exam data present  Assessment/plan: Russell Ortiz is a 48 y.o. male present for CPE Need for influenza vaccination - Flu Vaccine QUAD 6+ mos PF IM (Fluarix Quad PF) Elevated hemoglobin A1c - HgB A1c Elevated liver function tests - Comp Met (CMET) - Lipid panel - TSH Hyperlipidemia, unspecified hyperlipidemia type/Overweight (BMI 25.0-29.9) - diet and exercise encouraged.  - CBC - Lipid panel - TSH Prostate cancer screening - PSA Dry skin:  Left temporal/hairline seborrheic dermatitis>> prescribed hydrocortisone ointment QHs as needed only.  Encounter for preventive health examination Patient was encouraged to exercise greater than 150 minutes a week. Patient was encouraged to choose a diet filled with fresh fruits and vegetables, and Russell meats. AVS provided to patient today for education/recommendation on gender  specific health and safety maintenance. Colonoscopy: [name] with a P-uncle colon cancer at 51. Completed 06/01/2018, Dr. Hilarie Fredrickson 5 yr follow up.  Immunizations:  tdap UTD 04/2018, influenza provided  today. Infectious disease screening: HIV 2016 PSA collected today.    Return in about 1 year (around 04/21/2020) for CPE (30 min).  Note is dictated utilizing voice recognition software. Although note has been proof read prior to signing, occasional typographical errors still can be missed. If any questions arise, please do not hesitate to call for verification.  Electronically signed by: Howard Pouch, DO [place]

## 2019-04-22 NOTE — Patient Instructions (Signed)

## 2019-04-23 ENCOUNTER — Telehealth: Payer: Self-pay | Admitting: Family Medicine

## 2019-04-23 MED ORDER — EZETIMIBE 10 MG PO TABS: 10.0000 mg | ORAL_TABLET | Freq: Every day | ORAL | 3 refills | Status: DC

## 2019-04-23 NOTE — Telephone Encounter (Signed)
Pt was called and lab results/instructions were given to wife, okay per DPR.

## 2019-04-23 NOTE — Telephone Encounter (Signed)
Please inform patient the following information: His labs are normal and consistent with last year's labs with the exception of his cholesterol, which is a little higher than last year with a total cholesterol of 279, LDL/bad cholesterol 194.  He is intolerant to statins.  He is at increased cardiovascular risk for heart attack and stroke with this level of LDL cholesterol untreated. -I have called in a medication called Zetia.  This is not a statin.  This medication can help bring down the LDL levels, although it typically does not work as well as the statins and lowering the LDL.  I would recommend he start this medication and follow-up 3 months after starting to have his cholesterol rechecked.  If it is still elevated at that time would refer him to cardiology/lipid clinic to discuss other options for treatment.  Of course dietary changes and exercise can also be helpful, although high cholesterol can be hereditary as well. I would recommend a mediterranean diet and regular exercise-which we have discussed..  A mediterranean diet is high in fruits, vegetables, whole grains, fish, chicken, nuts, healthy fats (olive oil or canola oil). Low fat dairy. There are many online resources and books on this diet. Limit butter, margarine, red meat and sweets.

## 2019-07-26 ENCOUNTER — Ambulatory Visit (INDEPENDENT_AMBULATORY_CARE_PROVIDER_SITE_OTHER): Payer: No Typology Code available for payment source | Admitting: Family Medicine

## 2019-07-26 ENCOUNTER — Encounter: Payer: Self-pay | Admitting: Family Medicine

## 2019-07-26 ENCOUNTER — Other Ambulatory Visit: Payer: Self-pay

## 2019-07-26 VITALS — BP 135/87 | HR 64 | Temp 98.3°F | Resp 17 | Ht 73.0 in | Wt 222.0 lb

## 2019-07-26 DIAGNOSIS — E785 Hyperlipidemia, unspecified: Secondary | ICD-10-CM | POA: Diagnosis not present

## 2019-07-26 DIAGNOSIS — E663 Overweight: Secondary | ICD-10-CM

## 2019-07-26 LAB — HEPATIC FUNCTION PANEL
ALT: 39 U/L (ref 0–53)
AST: 53 U/L — ABNORMAL HIGH (ref 0–37)
Albumin: 4.6 g/dL (ref 3.5–5.2)
Alkaline Phosphatase: 53 U/L (ref 39–117)
Bilirubin, Direct: 0.2 mg/dL (ref 0.0–0.3)
Total Bilirubin: 0.8 mg/dL (ref 0.2–1.2)
Total Protein: 7.4 g/dL (ref 6.0–8.3)

## 2019-07-26 LAB — LIPID PANEL
Cholesterol: 238 mg/dL — ABNORMAL HIGH (ref 0–200)
HDL: 62.5 mg/dL (ref >39.00)
LDL Cholesterol: 153 mg/dL — ABNORMAL HIGH (ref 0–99)
NonHDL: 175.36
Total CHOL/HDL Ratio: 4
Triglycerides: 112 mg/dL (ref 0.0–149.0)
VLDL: 22.4 mg/dL (ref 0.0–40.0)

## 2019-07-26 NOTE — Progress Notes (Signed)
Patient ID: Russell Ortiz, male  DOB: [birth year], 48 y.o.   MRN: [id number] Patient Care Team    Relationship Specialty Notifications Start End  Ma Hillock, DO PCP - General Family Medicine  08/17/15   Pyrtle, Lajuan Lines, MD Consulting Physician Gastroenterology  04/22/19     Chief Complaint  Patient presents with  . Hyperlipidemia    Pt has been taking medications. Not fasting.     Subjective:  Russell Ortiz is a 49 y.o. male present for [facility] All past medical history, surgical history, allergies, family history, immunizations, medications and social history were updated in the electronic medical record today. All recent labs, ED visits and hospitalizations within the last year were reviewed.  Hyperlipidemia/overweight:  Elevated lipid panel. Intolerant to statins. Started on zeita and recommended routine exercise and dietary changes 3 mos ago. Today pt reports he is NOT fasting and he has been exercising. He changed his diet by adding more salads and fruits and decreased fast foods.   Lipid Panel     Component Value Date/Time   CHOL 279 (H) 04/22/2019 0950   TRIG 145.0 04/22/2019 0950   HDL 55.80 04/22/2019 0950   CHOLHDL 5 04/22/2019 0950   VLDL 29.0 04/22/2019 0950   LDLCALC 194 (H) 04/22/2019 0950   LDLDIRECT 231.1 03/29/2012 0927     Depression screen PHQ 2/9 04/22/2019 04/13/2018 09/14/2017 04/12/2017 03/17/2016  Decreased Interest 0 0 0 0 0  Down, Depressed, Hopeless 0 0 0 0 0  PHQ - 2 Score 0 0 0 0 0   No flowsheet data found.     Fall Risk  09/14/2017 03/17/2016  Falls in the past year? No No    Immunization History  Administered Date(s) Administered  . Influenza Split 03/29/2012  . Influenza,inj,Quad PF,6+ Mos 03/17/2016, 04/13/2018, 04/22/2019  . Td 08/15/2007  . Tdap 04/13/2018     Past Medical History:  Diagnosis Date  . Genital warts   . Hyperlipidemia   . PUD (peptic ulcer disease)    Allergies  Allergen Reactions  . Statins Other (See  Comments)    mylagia   Past Surgical History:  Procedure Laterality Date  . VASECTOMY  2010   Family History  Problem Relation Age of Onset  . Lung cancer Father   . Asthma Brother   . Diabetes Maternal Grandfather   . Diabetes Maternal Uncle   . Hyperlipidemia Maternal Grandmother   . Colon cancer Paternal Uncle   . Prostate cancer Paternal Uncle   . Stomach cancer Neg Hx   . Esophageal cancer Neg Hx   . Colon polyps Neg Hx    Social History   Social History Narrative   Married. 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    Wears seat belt. Exercises 3x or greater a week.    Former Smoker. Quit 1999, Occasional ETOH, no recreational drugs.    Drinks caffeine beverages. Uses herbal remedies. Takes a multivitamin.    Smoker alarm in the home.    Feels safe in his relationships.     Allergies as of 07/26/2019      Reactions   Statins Other (See Comments)   mylagia      Medication List       Accurate as of July 26, 2019 10:24 AM. If you have any questions, ask your nurse or doctor.        ezetimibe 10 MG tablet Commonly known  as: Zetia Take 1 tablet (10 mg total) by mouth daily.   FLAX SEED OIL PO Take 1,000 mg by mouth daily.   hydrocortisone 1 % ointment Apply 1 application topically 2 (two) times daily.   Multivitamin Adult Chew Chew by mouth.      All past medical history, surgical history, allergies, family history, immunizations andmedications were updated in the EMR today and reviewed under the history and medication portions of their EMR.      ROS: 14 pt review of systems performed and negative (unless mentioned in an HPI)  Objective: BP 135/87 (BP Location: Left Arm, Patient Position: Sitting, Cuff Size: Normal)   Pulse 64   Temp 98.3 F (36.8 C) (Temporal)   Resp 17   Ht 6\' 1"  (1.854 m)   Wt 222 lb (100.7 kg)   SpO2 96%   BMI 29.29 kg/m  Gen: Afebrile. No acute distress. Very pleasant  overweight [name] HENT: AT. [place].  Eyes:Pupils Equal Round Reactive to light, Extraocular movements intact,  Conjunctiva without redness, discharge or icterus. CV: RRR no murmur, no edema, +2/4 P posterior tibialis pulses Chest: CTAB, no wheeze or crackles Abd: Soft. NTND. BS present. no Masses palpated.  Skin: no rashes, purpura or petechiae.  Neuro:  Normal gait. PERLA. EOMi. Alert. Oriented x3  Psych: Normal affect, dress and demeanor. Normal speech. Normal thought content and judgment..    No exam data present  Assessment/plan: Russell Ortiz is a 49 y.o. male present for [facility] Hyperlipidemia, unspecified hyperlipidemia type/Overweight (BMI 25.0-29.9) - diet and exercise encouraged>> doing well on changes. Lost 5 lbs.  - continue zetia.  - lipid panel and LFT  collected today (non-fasting) - if cholesterol still above goal will refer to lipid clinic or consider start of praulent or repatha.  - f/u yearly with CPE or sooner if labs indicate need.    Orders Placed This Encounter  Procedures  . Lipid panel  . Hepatic function panel   No orders of the defined types were placed in this encounter.  Referral Orders  No referral(s) requested today    Note is dictated utilizing voice recognition software. Although note has been proof read prior to signing, occasional typographical errors still can be missed. If any questions arise, please do not hesitate to call for verification.  Electronically signed by: Howard Pouch, DO [place]

## 2019-07-26 NOTE — Patient Instructions (Signed)

## 2019-08-05 ENCOUNTER — Other Ambulatory Visit: Payer: Self-pay

## 2019-08-05 MED ORDER — EZETIMIBE 10 MG PO TABS: 10.0000 mg | ORAL_TABLET | Freq: Every day | ORAL | 1 refills | Status: DC

## 2019-08-05 MED FILL — EZETIMIBE 10 MG TABS: 10 | 90 days supply | Qty: 90 | Fill #0

## 2019-09-07 ENCOUNTER — Ambulatory Visit: Payer: No Typology Code available for payment source | Attending: Internal Medicine

## 2019-09-07 DIAGNOSIS — Z23 Encounter for immunization: Secondary | ICD-10-CM

## 2019-09-07 NOTE — Progress Notes (Signed)
   Covid-19 Vaccination Clinic  Name:  Russell Ortiz    MRN: [id number] DOB: 06/14/1970  09/07/2019  Mr. Russell Ortiz was observed post Covid-19 immunization for 15 minutes without incident. He was provided with Vaccine Information Sheet and instruction to access the V-Safe system.   Mr. Russell Ortiz was instructed to call 911 with any severe reactions post vaccine: [name] Difficulty breathing  . Swelling of face and throat  . A fast heartbeat  . A bad rash all over body  . Dizziness and weakness   Immunizations Administered    Name Date Dose VIS Date Route   Pfizer COVID-19 Vaccine 09/07/2019 11:32 AM 0.3 mL 05/17/2019 Intramuscular   Manufacturer: [place]   Lot: [id number]   [place]: [id number]

## 2019-10-02 ENCOUNTER — Ambulatory Visit: Payer: No Typology Code available for payment source | Attending: Internal Medicine

## 2019-10-02 DIAGNOSIS — Z23 Encounter for immunization: Secondary | ICD-10-CM

## 2019-10-02 NOTE — Progress Notes (Signed)
   Covid-19 Vaccination Clinic  Name:  Russell Ortiz    MRN: [id number] DOB: 1970/07/26  10/02/2019  Russell Ortiz was observed post Covid-19 immunization for 15 minutes without incident. He was provided with Vaccine Information Sheet and instruction to access the V-Safe system.   Russell Ortiz was instructed to call 911 with any severe reactions post vaccine: [name] Difficulty breathing  . Swelling of face and throat  . A fast heartbeat  . A bad rash all over body  . Dizziness and weakness   Immunizations Administered    Name Date Dose VIS Date Route   Pfizer COVID-19 Vaccine 10/02/2019  4:02 PM 0.3 mL 07/31/2018 Intramuscular   Manufacturer: [place]   Lot: [id number]   [place]: [id number]

## 2019-10-08 ENCOUNTER — Encounter (HOSPITAL_COMMUNITY): Payer: Self-pay

## 2019-10-08 ENCOUNTER — Ambulatory Visit (INDEPENDENT_AMBULATORY_CARE_PROVIDER_SITE_OTHER): Payer: No Typology Code available for payment source

## 2019-10-08 ENCOUNTER — Ambulatory Visit (HOSPITAL_COMMUNITY)
Admission: EM | Admit: 2019-10-08 | Discharge: 2019-10-08 | Disposition: A | Discharge status: Home / Self Care | Payer: No Typology Code available for payment source | Attending: Family Medicine | Admitting: Family Medicine

## 2019-10-08 DIAGNOSIS — M25512 Pain in left shoulder: Secondary | ICD-10-CM

## 2019-10-08 MED ORDER — PREDNISONE 10 MG (21) PO TBPK: ORAL_TABLET | ORAL | 0 refills | Status: DC

## 2019-10-08 MED ORDER — IBUPROFEN 800 MG PO TABS: 800.0000 mg | ORAL_TABLET | Freq: Three times a day (TID) | ORAL | 0 refills | Status: DC

## 2019-10-08 MED ORDER — CYCLOBENZAPRINE HCL 5 MG PO TABS: 5.0000 mg | ORAL_TABLET | Freq: Three times a day (TID) | ORAL | 0 refills | Status: DC | PRN

## 2019-10-08 MED FILL — IBUPROFEN 800 MG TAB: 800 | 10 days supply | Qty: 30 | Fill #0

## 2019-10-08 MED FILL — CYCLOBENZAPRINE HCL 5 MG TA: 5 | 5 days supply | Qty: 15 | Fill #0

## 2019-10-08 MED FILL — predniSONE 10 MG TABS: 10 | 12 days supply | Qty: 42 | Fill #0

## 2019-10-08 NOTE — Discharge Instructions (Addendum)
Rest, ice and heat as needed Ensure adequate ROM as tolerated. Prescribed ibuprofen as needed for pain relief Prescribed prednisone Prescribed flexeril  for muscle spasm.  Do not drive or operate heavy machinery while taking this medication Return here or go to ER if you have any new or worsening symptoms such as numbness/tingling of the inner thighs, loss of bladder or bowel control, headache/blurry vision, nausea/vomiting, confusion/altered mental status, dizziness, weakness, passing out, imbalance, etc..[name]

## 2019-10-08 NOTE — ED Provider Notes (Addendum)
[facility]    CSN: [id number] Arrival date & time: 10/08/19  1052      History   Chief Complaint Chief Complaint  Patient presents with  . Shoulder Pain    HPI Russell Ortiz is a 49 y.o. male.    Who presented to the urgent care with a complaint of left shoulder pain that started this morning.  Reported he was lowering a fried container at work while pushing down.  Developed the symptoms thereafter.  He localizes the pain to the left shoulder and left costovertebral area.  He describes the pain as constant and achy, rated as 6 on a scale of 1-10.  Is also any OTC medication.[name]  His symptoms are made worse with ROM.  He denies similar symptoms in the past.    The history is provided by the patient. No language interpreter was used.  Shoulder Pain   Past Medical History:  Diagnosis Date  . Genital warts   . Hyperlipidemia   . Mass of left thigh 04/06/2018  . PUD (peptic ulcer disease)     Patient Active Problem List   Diagnosis Date Noted  . Cervical radiculopathy 04/20/2018  . Neutropenia ([place]) 04/12/2017  . Overweight (BMI 25.0-29.9) 11/28/2016  . Elevated hemoglobin A1c 08/25/2016  . Elevated liver function tests 03/12/2015  . Hyperlipemia 03/12/2015    Past Surgical History:  Procedure Laterality Date  . VASECTOMY  2010       Home Medications    Prior to Admission medications   Medication Sig Start Date End Date Taking? Authorizing Provider  cyclobenzaprine (FLEXERIL) 5 MG tablet Take 1 tablet (5 mg total) by mouth 3 (three) times daily as needed for muscle spasms. 10/08/19   Lysette Lindenbaum, Darrelyn Hillock, FNP  ezetimibe (ZETIA) 10 MG tablet Take 1 tablet (10 mg total) by mouth daily. 08/05/19   Kuneff, Renee A, DO  Flaxseed, Linseed, (FLAX SEED OIL PO) Take 1,000 mg by mouth daily.    [provider]  hydrocortisone 1 % ointment Apply 1 application topically 2 (two) times daily. 04/22/19   Kuneff, Renee A, DO  ibuprofen (ADVIL) 800 MG tablet Take 1  tablet (800 mg total) by mouth 3 (three) times daily. 10/08/19   Muna Demers, Darrelyn Hillock, FNP  Multiple Vitamins-Minerals (MULTIVITAMIN ADULT) CHEW Chew by mouth.    [provider]  predniSONE (STERAPRED UNI-PAK 21 TAB) 10 MG (21) TBPK tablet Take 6 tabs by mouth daily  for 2 days, then 5 tabs for 2 days, then 4 tabs for 2 days, then 3 tabs for 2 days, 2 tabs for 2 days, then 1 tab by mouth daily for 2 days 10/08/19   Emerson Monte, FNP    Family History Family History  Problem Relation Age of Onset  . Lung cancer Father   . Asthma Brother   . Diabetes Maternal Grandfather   . Diabetes Maternal Uncle   . Hyperlipidemia Maternal Grandmother   . Colon cancer Paternal Uncle   . Prostate cancer Paternal Uncle   . Stomach cancer Neg Hx   . Esophageal cancer Neg Hx   . Colon polyps Neg Hx     Social History Social History   Tobacco Use  . Smoking status: Former Smoker    Quit date: 06/06/1997    Years since quitting: 22.3  . Smokeless tobacco: Never Used  Substance Use Topics  . Alcohol use: Yes    Alcohol/week: 10.0 standard drinks    Types: 4 Shots of  liquor, 6 Cans of beer per week    Comment: socially  . Drug use: No     Allergies   Statins   Review of Systems Review of Systems  Constitutional: Negative.   Respiratory: Negative.   Cardiovascular: Negative.   Musculoskeletal: Positive for arthralgias.  All other systems reviewed and are negative.    Physical Exam Triage Vital Signs ED Triage Vitals  Enc Vitals Group     BP 10/08/19 1128 (!) 139/91     Pulse Rate 10/08/19 1128 63     Resp 10/08/19 1128 16     Temp 10/08/19 1128 98.4 F (36.9 C)     Temp Source 10/08/19 1128 Oral     SpO2 10/08/19 1128 99 %     Weight --      Height --      Head Circumference --      Peak Flow --      Pain Score 10/08/19 1125 8     Pain Loc --      Pain Edu? --      Excl. in [place]? --    No data found.  Updated Vital Signs BP (!) 139/91 (BP Location: Right Arm)    Pulse 63   Temp 98.4 F (36.9 C) (Oral)   Resp 16   SpO2 99%   Visual Acuity Right Eye Distance:   Left Eye Distance:   Bilateral Distance:    Right Eye Near:   Left Eye Near:    Bilateral Near:     Physical Exam Vitals and nursing note reviewed.  Constitutional:      General: He is not in acute distress.    Appearance: Normal appearance. He is normal weight. He is not ill-appearing, toxic-appearing or diaphoretic.  Cardiovascular:     Rate and Rhythm: Normal rate and regular rhythm.     Pulses: Normal pulses.     Heart sounds: Normal heart sounds. No murmur. No friction rub. No gallop.   Pulmonary:     Effort: Pulmonary effort is normal. No respiratory distress.     Breath sounds: Normal breath sounds. No stridor. No wheezing, rhonchi or rales.  Chest:     Chest wall: No tenderness.  Abdominal:     General: Abdomen is flat. Bowel sounds are normal. There is no distension.     Palpations: There is no mass.     Tenderness: There is no abdominal tenderness. There is no right CVA tenderness, left CVA tenderness, guarding or rebound.     Hernia: No hernia is present.  Musculoskeletal:        General: Tenderness present. No swelling. Normal range of motion.     Right shoulder: Normal.     Left shoulder: Tenderness present. No swelling or bony tenderness. Normal strength.     Comments: Left shoulder is without any obvious asymmetry or deformity when compared to the right.  There is no surface trauma, ecchymosis deformity or swelling present.  Distal motor and neurovascular status intact.  Full range of motion within normal limits  Neurological:     Mental Status: He is alert.      UC Treatments / Results  Labs (all labs ordered are listed, but only abnormal results are displayed) Labs Reviewed - No data to display  EKG   Radiology No results found.  Procedures Procedures (including critical care time)  Medications Ordered in UC Medications - No data to display   Initial Impression / Assessment and Plan / UC  Course  I have reviewed the triage vital signs and the nursing notes.  Pertinent labs & imaging results that were available during my care of the patient were reviewed by me and considered in my medical decision making (see chart for details).    X-ray is negative for bony abnormality including fracture or dislocation.  I have reviewed the x-ray myself and the radiologist interpretation.  I am in agreement with the radiologist interpretation.  Patient is stable at discharge.  Symptoms likely muscular in nature.  Will prescribe ibuprofen, Flexeril and prednisone.  Final Clinical Impressions(s) / UC Diagnoses   Final diagnoses:  Acute pain of left shoulder     Discharge Instructions     Rest, ice and heat as needed Ensure adequate ROM as tolerated. Prescribed ibuprofen as needed for pain relief Prescribed prednisone Prescribed flexeril  for muscle spasm.  Do not drive or operate heavy machinery while taking this medication Return here or go to ER if you have any new or worsening symptoms such as numbness/tingling of the inner thighs, loss of bladder or bowel control, headache/blurry vision, nausea/vomiting, confusion/altered mental status, dizziness, weakness, passing out, imbalance, etc...      ED Prescriptions    Medication Sig Dispense Auth. Provider   predniSONE (STERAPRED UNI-PAK 21 TAB) 10 MG (21) TBPK tablet Take 6 tabs by mouth daily  for 2 days, then 5 tabs for 2 days, then 4 tabs for 2 days, then 3 tabs for 2 days, 2 tabs for 2 days, then 1 tab by mouth daily for 2 days 42 tablet Britaney Espaillat, Darrelyn Hillock, FNP   cyclobenzaprine (FLEXERIL) 5 MG tablet Take 1 tablet (5 mg total) by mouth 3 (three) times daily as needed for muscle spasms. 15 tablet Koleen Celia S, FNP   ibuprofen (ADVIL) 800 MG tablet Take 1 tablet (800 mg total) by mouth 3 (three) times daily. 30 tablet Suren Payne, Darrelyn Hillock, FNP     PDMP not reviewed this encounter.       Emerson Monte, FNP 10/12/19 0903    Emerson Monte, FNP 10/12/19 [phone]

## 2019-10-08 NOTE — ED Triage Notes (Signed)
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 Reports while winding the lift down, he started experiencing left sided shoulder pain. Reports it is worse when he moves that shoulder.

## 2019-10-31 IMAGING — US US EXTREM LOW*L* LIMITED
1 series · 14 of 18 positions shown · non-contrast
Comparison: None.

CLINICAL DATA: Left thigh pain.

EXAM:
ULTRASOUND LEFT LOWER EXTREMITY LIMITED
TECHNIQUE: Ultrasound examination of the lower extremity soft tissues was
performed in the area of clinical concern.

[Series 1: us extrem low*left* limited · 0.07mm/px · 14 of 18 slices shown]
[im 1/18]
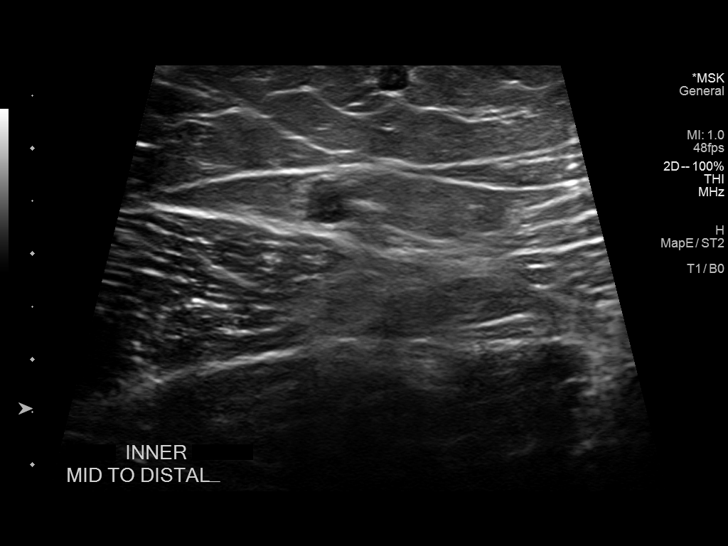
[im 2/18]
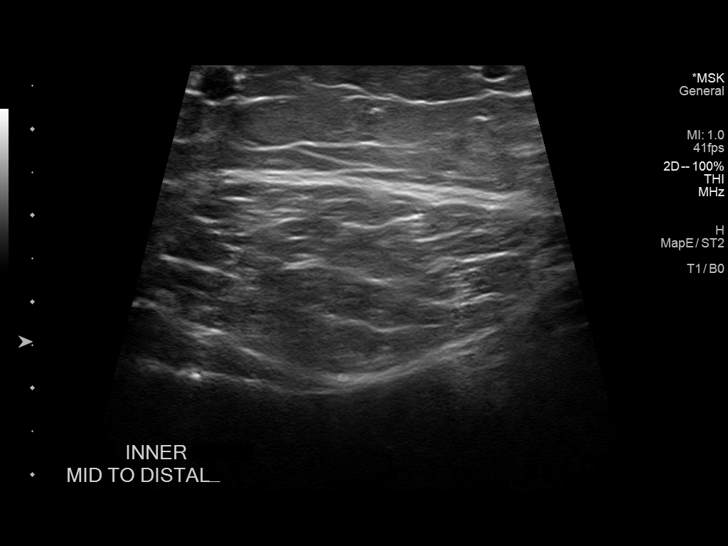
[im 4/18]
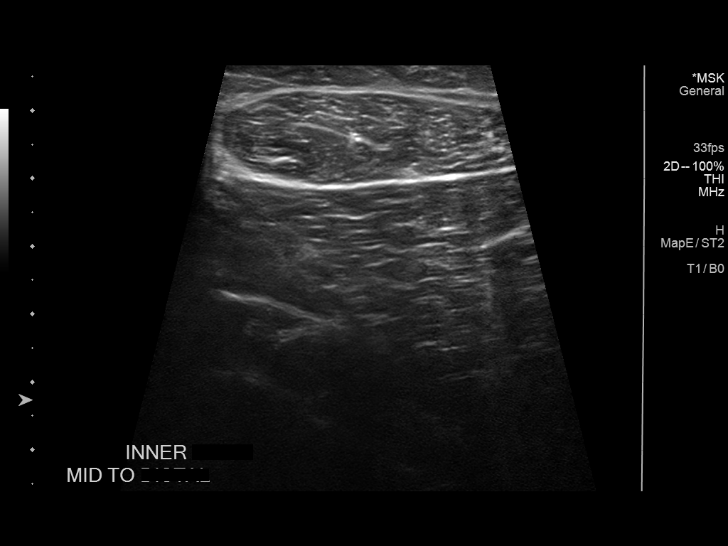
[im 5/18]
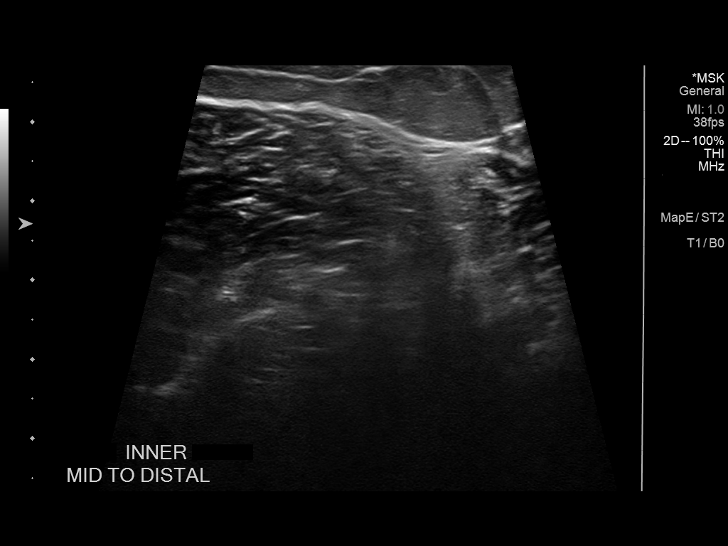
[im 6/18]
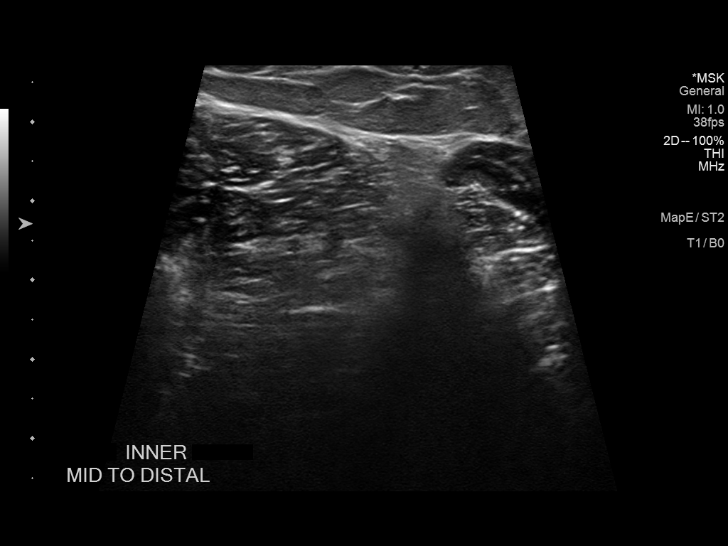
[im 8/18]
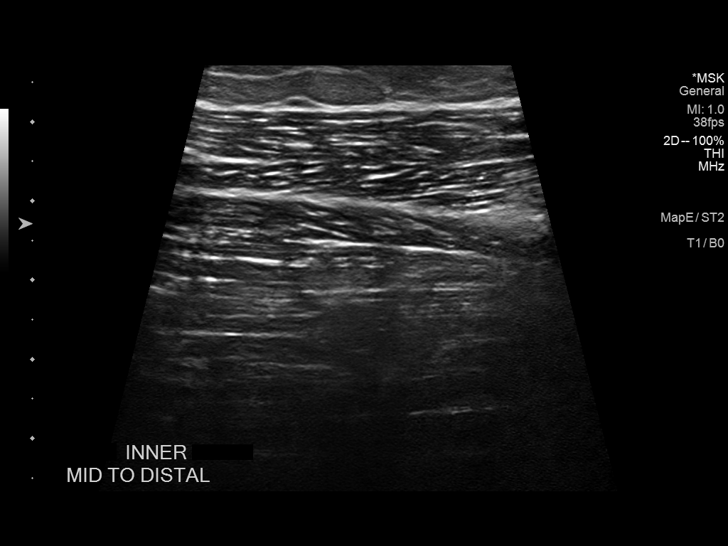
[im 9/18]
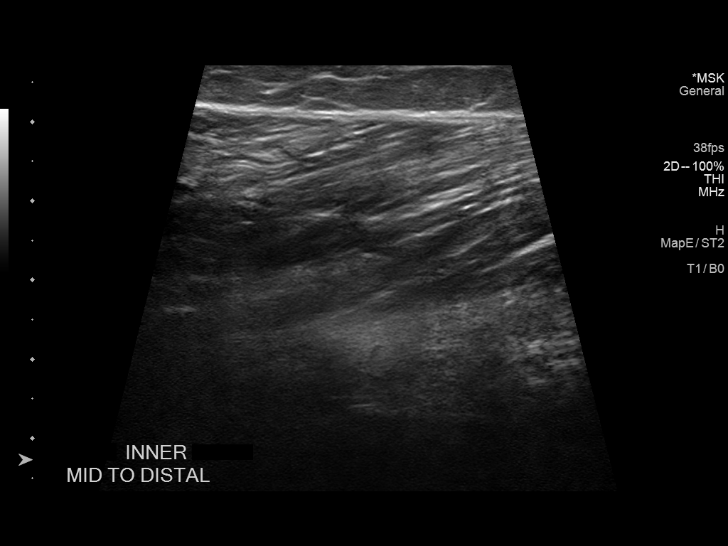
[im 10/18]
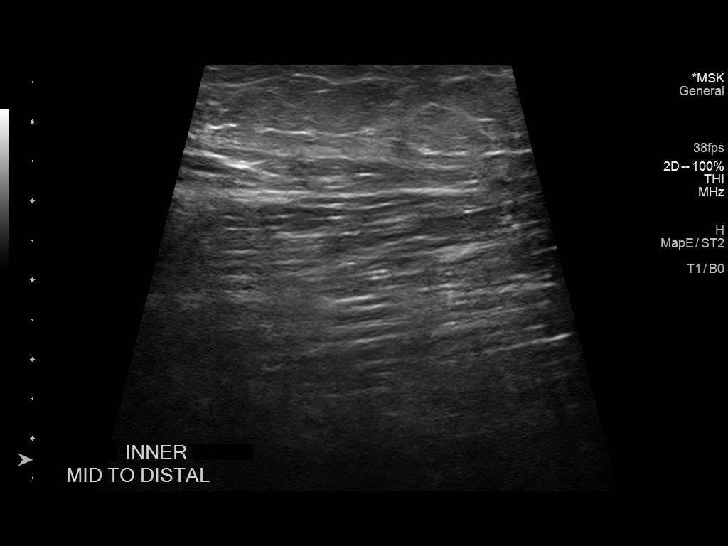
[im 11/18]
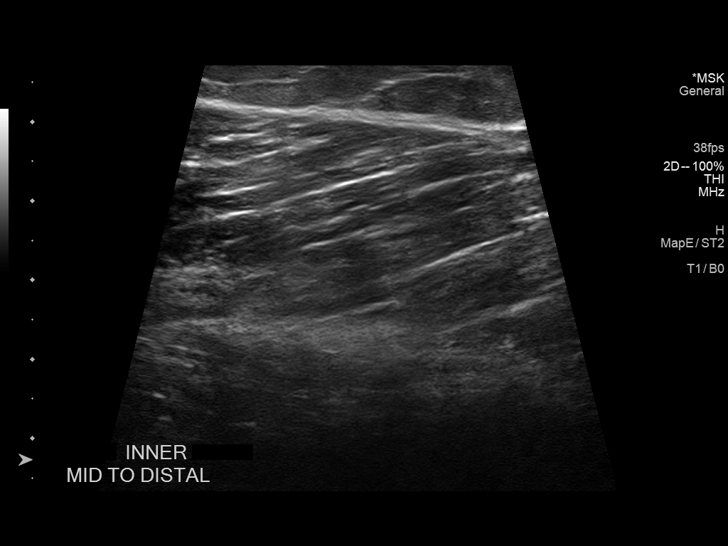
[im 13/18]
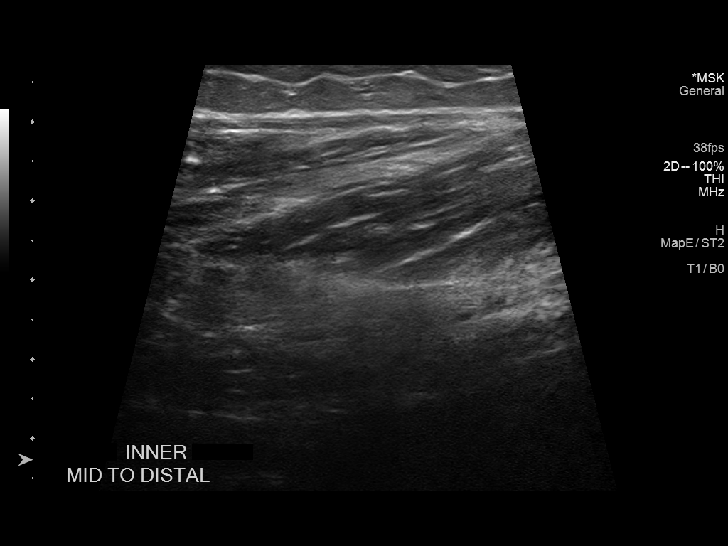
[im 14/18]
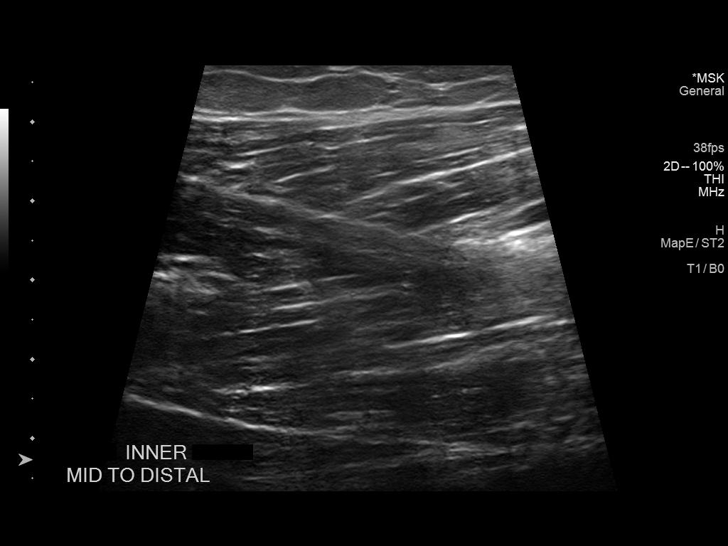
[im 15/18]
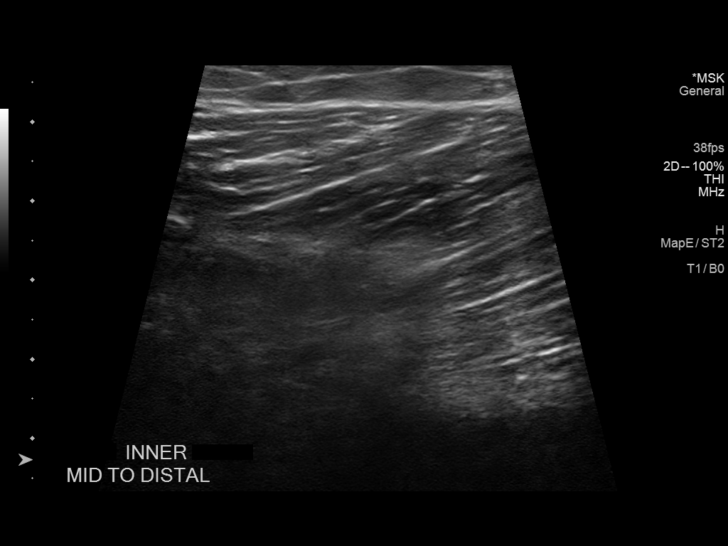
[im 17/18]
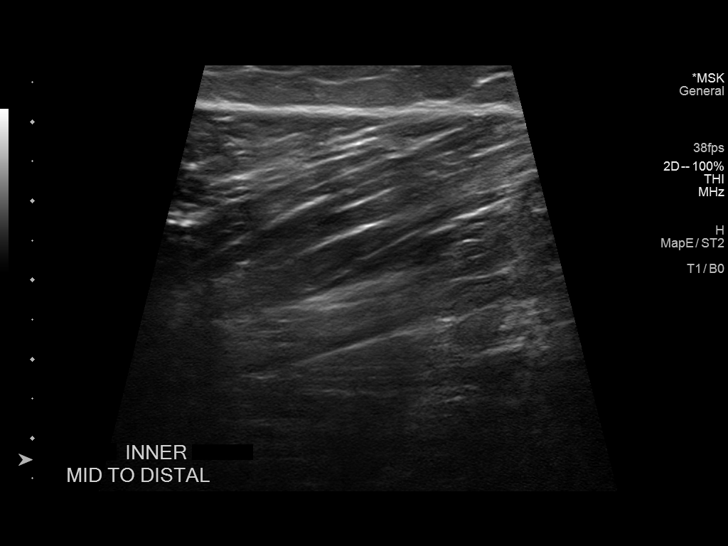
[im 18/18]
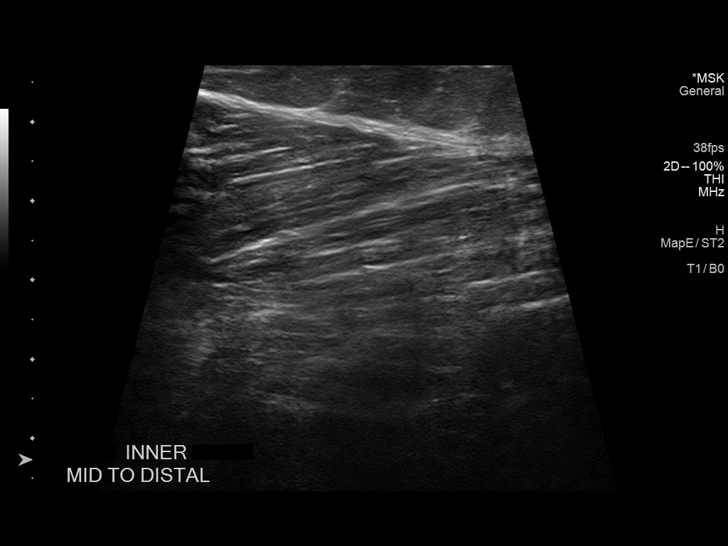

[14 of 18 positions shown; findings below may reference images not displayed]

FINDINGS: Muscles: Normal.

Tendons: Normal

Other Soft Tissue Structures: Normal.
IMPRESSION: No sonographic abnormality in the area of pain in the left inner
thigh.

## 2019-11-06 ENCOUNTER — Ambulatory Visit: Payer: No Typology Code available for payment source | Admitting: Family Medicine

## 2019-11-12 ENCOUNTER — Encounter: Payer: Self-pay | Admitting: Family Medicine

## 2019-11-12 ENCOUNTER — Ambulatory Visit (INDEPENDENT_AMBULATORY_CARE_PROVIDER_SITE_OTHER): Payer: No Typology Code available for payment source | Admitting: Family Medicine

## 2019-11-12 ENCOUNTER — Other Ambulatory Visit: Payer: Self-pay

## 2019-11-12 VITALS — BP 127/82 | HR 92 | Temp 99.2°F | Resp 16 | Ht 73.0 in | Wt 230.4 lb

## 2019-11-12 DIAGNOSIS — R21 Rash and other nonspecific skin eruption: Secondary | ICD-10-CM

## 2019-11-12 MED ORDER — PREDNISONE 20 MG PO TABS: ORAL_TABLET | ORAL | 0 refills | Status: DC

## 2019-11-12 MED ORDER — TRIAMCINOLONE ACETONIDE 0.1 % EX CREA: 1.0000 "application " | TOPICAL_CREAM | Freq: Two times a day (BID) | CUTANEOUS | 2 refills | Status: DC

## 2019-11-12 NOTE — Patient Instructions (Signed)
  Heat Rash, Adult  Heat rash is an itchy rash of little red bumps that often occurs during hot, humid weather. Heat rash is also called prickly heat or miliaria. Heat rash usually affects:  Armpits.  Elbows.  Groin.  Neck.  The area underneath the breasts.  Shoulders.  Chest. What are the causes? This condition is caused by blocked sweat ducts. When sweat is trapped under the skin, it spreads into surrounding tissues and causes a rash of red bumps. What increases the risk? This condition is more likely to develop in people who:  Are overdressed in hot, humid weather.  Wear clothing that rubs against the skin.  Are active in hot, humid weather.  Sweat a lot.  Are not used to hot, humid weather. What are the signs or symptoms? Symptoms of this condition include:  Small red bumps that are itchy or prickly.  Very little sweating or no sweating in the affected area. How is this diagnosed? This condition is diagnosed based on your symptoms and medical history, as well as a physical exam. How is this treated? Moving to a cool, dry place is the best treatment for heat rash. Treatment may also include medicines, such as:  Corticosteroid creams for skin irritation.  Antibiotic medicines, if the rash becomes infected. Follow these instructions at home: Skin care  Keep the affected area dry.  Do not apply ointments or creams that contain mineral oil or petroleum ingredients to your skin. These can make the condition worse.  Apply cool compresses to the affected areas.  Do not scratch your skin.  Do not take hot showers or baths. General instructions  Take over-the-counter and prescription medicines only as told by your health care provider.  If you were prescribed an antibiotic, take it as told by your health care provider. Do not stop taking it even if your condition improves.  Stay in a cool room as much as possible. Use an air conditioner or fan, if  possible.  Do not wear tight clothes. Wear comfortable, loose-fitting clothing.  Keep all follow-up visits as told by your health care provider. This is important. Contact a health care provider if:  You have a fever.  Your rash does not go away after 3-4 days.  Your rash gets worse or it is very itchy.  Your rash has pus or fluid coming from it. Get help right away if:  You are dizzy or nauseated.  You feel confused.  You have trouble breathing.  You have chest pain.  You have muscle cramps or contractions.  You faint. Summary  Heat rash is an itchy rash of little red bumps that often occurs during hot, humid weather.  Symptoms of heat rash include small red bumps that are itchy or prickly and very little or no sweating in the affected area.  This condition is diagnosed based on your symptoms and medical history, as well as a physical exam.  Moving to a cool, dry place is the best treatment for heat rash.  Do not wear tight clothes. Wear comfortable, loose-fitting clothing. This information is not intended to replace advice given to you by your health care provider. Make sure you discuss any questions you have with your health care provider. Document Revised: 05/05/2017 Document Reviewed: 08/03/2016 Elsevier Patient Education  2020 Elsevier Inc.  

## 2019-11-12 NOTE — Progress Notes (Signed)
This visit occurred during the SARS-CoV-2 public health emergency.  Safety protocols were in place, including screening questions prior to the visit, additional usage of staff PPE, and extensive cleaning of exam room while observing appropriate contact time as indicated for disinfecting solutions.    Russell Ortiz, Russell Ortiz [birth year], 49 y.o., male MRN: [id number] Patient Care Team    Relationship Specialty Notifications Start End  Ma Hillock, DO PCP - General Family Medicine  08/17/15   Pyrtle, Lajuan Lines, MD Consulting Physician Gastroenterology  04/22/19     Chief Complaint  Patient presents with   Rash on both arms and hands    states this happens once a year, redness and irritation. Has tried  hydrocortisone cream and neosporin. Neosporin gives minor relief     Subjective: Pt presents for an OV with complaints of reoccurring rash  of a few weeks duration.  Associated symptoms include itching. Per EMR, review he has had similar rash, typically always in the summer months.  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 Rash typically improves with kenalog cream. He has also used prednisone in the past.   Pt has tried hydrocortisone, benadryl gel and neosporin to ease their symptoms.   Depression screen [facility] 2/9 04/22/2019 04/13/2018 09/14/2017 04/12/2017 03/17/2016  Decreased Interest 0 0 0 0 0  Down, Depressed, Hopeless 0 0 0 0 0  PHQ - 2 Score 0 0 0 0 0    Allergies  Allergen Reactions   Statins Other (See Comments)    mylagia   Social History   Social History Narrative   Married. 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    Wears seat belt. Exercises 3x or greater a week.    Former Smoker. Quit 1999, Occasional ETOH, no recreational drugs.    Drinks caffeine beverages. Uses herbal remedies. Takes a multivitamin.    Smoker alarm in the home.    Feels safe in his relationships.    Past Medical History:  Diagnosis Date    Genital warts    Hyperlipidemia    Mass of left thigh 04/06/2018   PUD (peptic ulcer disease)    Past Surgical History:  Procedure Laterality Date   VASECTOMY  2010   Family History  Problem Relation Age of Onset   Lung cancer Father    Asthma Brother    Diabetes Maternal Grandfather    Diabetes Maternal Uncle    Hyperlipidemia Maternal Grandmother    Colon cancer Paternal Uncle    Prostate cancer Paternal Uncle    Stomach cancer Neg Hx    Esophageal cancer Neg Hx    Colon polyps Neg Hx    Allergies as of 11/12/2019      Reactions   Statins Other (See Comments)   mylagia      Medication List       Accurate as of November 12, 2019  3:45 PM. If you have any questions, ask your nurse or doctor.        STOP taking these medications   cyclobenzaprine 5 MG tablet Commonly known as: FLEXERIL Stopped by: Howard Pouch, DO   ibuprofen 800 MG tablet Commonly known as: ADVIL Stopped by: Howard Pouch, DO   predniSONE 10 MG (21) Tbpk tablet Commonly known as: STERAPRED UNI-PAK 21 TAB Stopped by: Howard Pouch, DO     TAKE these medications   ezetimibe 10 MG tablet Commonly known as: Zetia  Take 1 tablet (10 mg total) by mouth daily.   FLAX SEED OIL PO Take 1,000 mg by mouth daily.   hydrocortisone 1 % ointment Apply 1 application topically 2 (two) times daily.   Multivitamin Adult Chew Chew by mouth.       All past medical history, surgical history, allergies, family history, immunizations andmedications were updated in the EMR today and reviewed under the history and medication portions of their EMR.     ROS: Negative, with the exception of above mentioned in HPI   Objective:  BP 127/82 (BP Location: Right Arm, Patient Position: Sitting, Cuff Size: Large)    Pulse 92    Temp 99.2 F (37.3 C) (Temporal)    Resp 16    Ht 6\' 1"  (1.854 m)    Wt 230 lb 6.4 oz (104.5 kg)    SpO2 97%    BMI 30.40 kg/m  Body mass index is 30.4 kg/m. Gen: Afebrile. No acute  distress. Nontoxic in appearance, well developed, well nourished.  HENT: AT. [place].  Eyes:Pupils Equal Round Reactive to light, Extraocular movements intact,  Conjunctiva without redness, discharge or icterus. Skin: fine mildly raised pink rash bilateral wrist and left forearm, no purpura or petechiae.  Neuro: Normal gait. PERLA. EOMi. Alert. Oriented x3  Psych: Normal affect, dress and demeanor. Normal speech. Normal thought content and judgment.  No exam data present No results found. No results found for this or any previous visit (from the past 24 hour(s)).  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 male present for OV for  Rash - rash around wrist and left forearm. Occurs yearly around this time.  - typically responds to kenalog cream. He has had steroid taper in the past as well.  - Start with kenalog cream BID. If not seeing an improvement > printed prednisone script for him.  The rash is in the distribution of gloves- he is a truck driver and rash occurs in the summer> suggesting possible heat rash from sweating and gloves. Discussed preventive steps- gloves that are more breathable material around wrist, cooling/wicking shirts etc.  F/U PRN   Reviewed expectations re: course of current medical issues.  Discussed self-management of symptoms.  Outlined signs and symptoms indicating need for more acute intervention.  Patient verbalized understanding and all questions were answered.  Patient received an After-Visit Summary.    No orders of the defined types were placed in this encounter.  No orders of the defined types were placed in this encounter.  Referral Orders  No referral(s) requested today     Note is dictated utilizing voice recognition software. Although note has been proof read prior to signing, occasional typographical errors still can be missed. If any questions arise, please do not hesitate to call for verification.   electronically signed by:  Howard Pouch, DO  [place]

## 2019-11-29 MED FILL — EZETIMIBE 10 MG TABS: 10 | 90 days supply | Qty: 90 | Fill #1

## 2020-01-15 ENCOUNTER — Encounter: Payer: Self-pay | Admitting: Family Medicine

## 2020-01-15 DIAGNOSIS — Z0100 Encounter for examination of eyes and vision without abnormal findings: Secondary | ICD-10-CM

## 2020-01-28 NOTE — Telephone Encounter (Signed)
Not sure why they are requiring referral. This provider appears to be in network with insurance, however will not see him without referral.

## 2020-01-28 NOTE — Telephone Encounter (Signed)
[facility] refused to see patient today. They are a speciality office so they require a referral. Please send a referral for Dr. Ernestine Mcmurray with [facility].

## 2020-01-28 NOTE — Telephone Encounter (Signed)
Patients wife, Danae Chen, informed of referral status.

## 2020-01-28 NOTE — Telephone Encounter (Signed)
I placed the referral for him.  Please let him know.

## 2020-03-09 ENCOUNTER — Emergency Department (HOSPITAL_BASED_OUTPATIENT_CLINIC_OR_DEPARTMENT_OTHER)
Admission: EM | Admit: 2020-03-09 | Discharge: 2020-03-10 | Disposition: A | Discharge status: Home / Self Care | Payer: No Typology Code available for payment source | Attending: Emergency Medicine | Admitting: Emergency Medicine

## 2020-03-09 ENCOUNTER — Emergency Department (HOSPITAL_COMMUNITY): Payer: No Typology Code available for payment source

## 2020-03-09 ENCOUNTER — Emergency Department (HOSPITAL_BASED_OUTPATIENT_CLINIC_OR_DEPARTMENT_OTHER): Payer: No Typology Code available for payment source

## 2020-03-09 ENCOUNTER — Telehealth: Payer: Self-pay

## 2020-03-09 ENCOUNTER — Encounter (HOSPITAL_BASED_OUTPATIENT_CLINIC_OR_DEPARTMENT_OTHER): Payer: Self-pay | Admitting: Emergency Medicine

## 2020-03-09 ENCOUNTER — Other Ambulatory Visit: Payer: Self-pay

## 2020-03-09 DIAGNOSIS — R079 Chest pain, unspecified: Secondary | ICD-10-CM | POA: Insufficient documentation

## 2020-03-09 DIAGNOSIS — R519 Headache, unspecified: Secondary | ICD-10-CM | POA: Insufficient documentation

## 2020-03-09 DIAGNOSIS — Z87891 Personal history of nicotine dependence: Secondary | ICD-10-CM | POA: Insufficient documentation

## 2020-03-09 DIAGNOSIS — R202 Paresthesia of skin: Secondary | ICD-10-CM | POA: Diagnosis not present

## 2020-03-09 LAB — BASIC METABOLIC PANEL
Anion gap: 10 (ref 5–15)
BUN: 9 mg/dL (ref 6–20)
CO2: 27 mmol/L (ref 22–32)
Calcium: 10 mg/dL (ref 8.9–10.3)
Chloride: 100 mmol/L (ref 98–111)
Creatinine, Ser: 1.23 mg/dL (ref 0.61–1.24)
GFR calc Af Amer: >60 mL/min (ref >60)
GFR calc non Af Amer: >60 mL/min (ref >60)
Glucose, Bld: 91 mg/dL (ref 70–99)
Potassium: 3.7 mmol/L (ref 3.5–5.1)
Sodium: 137 mmol/L (ref 135–145)

## 2020-03-09 LAB — CBC WITH DIFFERENTIAL/PLATELET
Abs Immature Granulocytes: 0.01 10*3/uL (ref 0.00–0.07)
Basophils Absolute: 0 10*3/uL (ref 0.0–0.1)
Basophils Relative: 0 %
Eosinophils Absolute: 0.1 10*3/uL (ref 0.0–0.5)
Eosinophils Relative: 3 %
HCT: 48.4 % (ref 39.0–52.0)
Hemoglobin: 15.5 g/dL (ref 13.0–17.0)
Immature Granulocytes: 0 %
Lymphocytes Relative: 51 %
Lymphs Abs: 2.3 10*3/uL (ref 0.7–4.0)
MCH: 27.7 pg (ref 26.0–34.0)
MCHC: 32 g/dL (ref 30.0–36.0)
MCV: 86.6 fL (ref 80.0–100.0)
Monocytes Absolute: 0.4 10*3/uL (ref 0.1–1.0)
Monocytes Relative: 8 %
Neutro Abs: 1.7 10*3/uL (ref 1.7–7.7)
Neutrophils Relative %: 38 %
Platelets: 175 10*3/uL (ref 150–400)
RBC: 5.59 MIL/uL (ref 4.22–5.81)
RDW: 13.2 % (ref 11.5–15.5)
WBC: 4.5 10*3/uL (ref 4.0–10.5)
nRBC: 0 % (ref 0.0–0.2)

## 2020-03-09 LAB — TROPONIN I (HIGH SENSITIVITY)
Troponin I (High Sensitivity): 5 ng/L (ref <18)
Troponin I (High Sensitivity): 5 ng/L (ref <18)

## 2020-03-09 NOTE — Telephone Encounter (Signed)
Spoke with pts wife- he is en route to [employer]

## 2020-03-09 NOTE — Telephone Encounter (Addendum)
FYI. Please see below.  Raenette Rover, RN, Zella Ball 03/09/2020 12:03:11 PM Disp. Time Eilene Ghazi Time) Disposition Final User 03/09/2020 11:58:52 AM Send to Urgent Micheal Likens 03/09/2020 12:12:47 PM [place], RN, Zella Ball Reason: Brother driving him.PLEASE NOTE: All timestamps contained within this report are represented as [place] Standard Time. CONFIDENTIALTY NOTICE: This fax transmission is intended only for the addressee. It contains information that is legally privileged, confidential or otherwise protected from use or disclosure. If you are not the intended recipient, you are strictly prohibited from reviewing, disclosing, copying using or disseminating any of this information or taking any action in reliance on or regarding this information. If you have received this fax in error, please notify us immediately by telephone so that we can arrange for its return to [place]. Phone: [phone], Toll-Free: [phone], Fax: [phone] Page: 2 of 2 Call Id: [id number] 03/09/2020 12:04:18 PM Call EMS 911 Now Yes Raenette Rover, RN, Herbert Deaner Disagree/Comply Comply Caller Understands Yes PreDisposition Did not know what to do Care Advice Given Per Guideline CALL EMS 911 NOW: CARE ADVICE given per Neurologic Deficit (Adult) guideline. Comments User: Wilson Singer, RN Date/Time Eilene Ghazi Time): 03/09/2020 12:09:45 PM Left arm numb and tingling onset 30 min ago. Currently driving. Advised to pull over and call 911 and go to the nearest hospital. Denies any new medication use. Denies any history of htn. Referrals GO TO FACILITY OTHER - SPECIFY

## 2020-03-09 NOTE — ED Triage Notes (Signed)
PT was working and had left hand numbness that has resolved.

## 2020-03-09 NOTE — ED Provider Notes (Signed)
[facility] EMERGENCY DEPARTMENT Provider Note   CSN: [id number] Arrival date & time: 03/09/20  1235     History Chief Complaint  Patient presents with  . Numbness    Russell Ortiz is a 49 y.o. male.  Russell Ortiz is a 49 y.o. male with a history of hyperlipidemia, who presents to the emergency department for evaluation of numbness and tingling in his left hand. 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 He thinks that all of his fingers were numb and it was not just present in the first 3 or last 2. He denies any associated pain and has never felt similar numbness before. Denies any numbness weakness or tingling elsewhere. He does state that while waiting here he developed a mild headache but thinks that is because he has not had anything to eat all day. He denies any associated visual changes or dizziness. He does not have a history of stroke but states that his brother who is just a few years older than him was recently diagnosed with a stroke. He also states that while waiting in the ED he developed some upper left chest pain that he describes as a sharp pain that has been constant and present for about 15 minutes. No prior heart history. No associated shortness of breath or pleuritic pain. He is unsure if pain is worse with exertion as he has been seated. Pain is not radiating to the arm and he denies any return of numbness to the hand associated with this. No history of carpal tunnel or wrist injury. No other aggravating or alleviating factors.        Past Medical History:  Diagnosis Date  . Genital warts   . Hyperlipidemia   . Mass of left thigh 04/06/2018  . PUD (peptic ulcer disease)     Patient Active Problem List   Diagnosis Date Noted  . Cervical radiculopathy 04/20/2018  . Neutropenia ([place]) 04/12/2017  . Overweight (BMI 25.0-29.9) 11/28/2016  . Elevated  hemoglobin A1c 08/25/2016  . Elevated liver function tests 03/12/2015  . Hyperlipemia 03/12/2015    Past Surgical History:  Procedure Laterality Date  . VASECTOMY  2010       Family History  Problem Relation Age of Onset  . Lung cancer Father   . Asthma Brother   . Diabetes Maternal Grandfather   . Diabetes Maternal Uncle   . Hyperlipidemia Maternal Grandmother   . Colon cancer Paternal Uncle   . Prostate cancer Paternal Uncle   . Stomach cancer Neg Hx   . Esophageal cancer Neg Hx   . Colon polyps Neg Hx     Social History   Tobacco Use  . Smoking status: Former Smoker    Quit date: 06/06/1997    Years since quitting: 22.7  . Smokeless tobacco: Never Used  Vaping Use  . Vaping Use: Never used  Substance Use Topics  . Alcohol use: Yes    Alcohol/week: 10.0 standard drinks    Types: 4 Shots of liquor, 6 Cans of beer per week    Comment: socially  . Drug use: No    Home Medications Prior to Admission medications   Medication Sig Start Date End Date Taking? Authorizing Provider  ezetimibe (ZETIA) 10 MG tablet Take 1 tablet (10 mg total) by mouth daily. 08/05/19   Kuneff, Renee A, DO  Flaxseed,  Linseed, (FLAX SEED OIL PO) Take 1,000 mg by mouth daily.    [provider]  hydrocortisone 1 % ointment Apply 1 application topically 2 (two) times daily. 04/22/19   Kuneff, Renee A, DO  Multiple Vitamins-Minerals (MULTIVITAMIN ADULT) CHEW Chew by mouth.    [provider]  predniSONE (DELTASONE) 20 MG tablet 60 mg x3d, 40 mg x3d, 20 mg x2d, 10 mg x2d 11/12/19   Kuneff, Renee A, DO  triamcinolone cream (KENALOG) 0.1 % Apply 1 application topically 2 (two) times daily. 11/12/19   Kuneff, Renee A, DO    Allergies    Statins  Review of Systems   Review of Systems  Constitutional: Negative for chills and fever.  HENT: Negative.   Respiratory: Negative for cough and shortness of breath.   Cardiovascular: Positive for chest pain.  Gastrointestinal: Negative for  abdominal pain, constipation, diarrhea, nausea and vomiting.  Musculoskeletal: Negative for arthralgias and myalgias.  Skin: Negative for color change and rash.  Neurological: Positive for numbness and headaches. Negative for dizziness, seizures, syncope, facial asymmetry, speech difficulty, weakness and light-headedness.    Physical Exam Updated Vital Signs BP (!) 151/92   Pulse 75   Temp 97.6 F (36.4 C) (Oral)   Resp 18   SpO2 99%   Physical Exam Vitals and nursing note reviewed.  Constitutional:      General: He is not in acute distress.    Appearance: Normal appearance. He is well-developed. He is not ill-appearing or diaphoretic.     Comments: Well-appearing and in no distress  HENT:     Head: Normocephalic and atraumatic.     Mouth/Throat:     Mouth: Mucous membranes are moist.     Pharynx: Oropharynx is clear.  Eyes:     General:        Right eye: No discharge.        Left eye: No discharge.     Extraocular Movements: Extraocular movements intact.     Pupils: Pupils are equal, round, and reactive to light.  Cardiovascular:     Rate and Rhythm: Normal rate and regular rhythm.     Heart sounds: Normal heart sounds. No murmur heard.  No friction rub. No gallop.   Pulmonary:     Effort: Pulmonary effort is normal. No respiratory distress.     Breath sounds: Normal breath sounds.     Comments: Respirations equal and unlabored, patient able to speak in full sentences, lungs clear to auscultation bilaterally Abdominal:     General: Abdomen is flat. Bowel sounds are normal. There is no distension.     Palpations: Abdomen is soft. There is no mass.     Tenderness: There is no abdominal tenderness. There is no guarding.  Musculoskeletal:     Cervical back: Normal range of motion and neck supple. No tenderness.     Comments: No tenderness to palpation over the left hand or wrist, unable to reproduce numbness with Tinel's or Phalen's test, no reproduced numbness or tingling  with percussion over the cubital tunnel. Full range of motion of the left upper extremity without pain.  Skin:    General: Skin is warm and dry.  Neurological:     Mental Status: He is alert and oriented to person, place, and time.     Coordination: Coordination normal.     Comments: Speech is clear, able to follow commands CN III-XII intact Normal strength in upper and lower extremities bilaterally including dorsiflexion and plantar flexion, strong and  equal grip strength Sensation normal to light and sharp touch Moves extremities without ataxia, coordination intact Normal finger to nose and rapid alternating movements No pronator drift  Psychiatric:        Mood and Affect: Mood normal.        Behavior: Behavior normal.     ED Results / Procedures / Treatments   Labs (all labs ordered are listed, but only abnormal results are displayed) Labs Reviewed  BASIC METABOLIC PANEL  CBC WITH DIFFERENTIAL/PLATELET  TROPONIN I (HIGH SENSITIVITY)  TROPONIN I (HIGH SENSITIVITY)    EKG EKG Interpretation  Date/Time:  Monday March 09 2020 18:24:35 EDT Ventricular Rate:  50 PR Interval:    QRS Duration: 89 QT Interval:  428 QTC Calculation: 391 R Axis:   9 Text Interpretation: Sinus rhythm Borderline T abnormalities, anterior leads Baseline wander in lead(s) V6 No STEMI Confirmed by Octaviano Glow [phone]) on 03/09/2020 8:20:00 PM   Radiology DG Chest 2 View  Result Date: 03/09/2020 CLINICAL DATA:  Chest pain.  Intermittent tingling in left hand. EXAM: CHEST - 2 VIEW COMPARISON:  Radiograph 04/07/2016 FINDINGS: The cardiomediastinal contours are normal. Mild chronic elevation of right hemidiaphragm. Pulmonary vasculature is normal. No consolidation, pleural effusion, or pneumothorax. No acute osseous abnormalities are seen. IMPRESSION: No acute chest findings. Electronically Signed   By: Keith Rake M.D.   On: 03/09/2020 19:23   CT Head Wo Contrast  Result Date:  03/09/2020 CLINICAL DATA:  Tingling of the left hand today.  Intermittent. EXAM: CT HEAD WITHOUT CONTRAST TECHNIQUE: Contiguous axial images were obtained from the base of the skull through the vertex without intravenous contrast. COMPARISON:  None. FINDINGS: Brain: The brain shows a normal appearance without evidence of malformation, atrophy, old or acute small or large vessel infarction, mass lesion, hemorrhage, hydrocephalus or extra-axial collection. Vascular: No hyperdense vessel. No evidence of atherosclerotic calcification. Skull: Normal.  No traumatic finding.  No focal bone lesion. Sinuses/Orbits: Sinuses are clear. Orbits appear normal. Mastoids are clear. Other: None significant IMPRESSION: Normal head CT. Electronically Signed   By: Nelson Chimes M.D.   On: 03/09/2020 19:25    Procedures Procedures (including critical care time)  Medications Ordered in ED Medications - No data to display  ED Course  I have reviewed the triage vital signs and the nursing notes.  Pertinent labs & imaging results that were available during my care of the patient were reviewed by me and considered in my medical decision making (see chart for details).  Clinical Course as of Mar 09 2124  Mon Mar 09, 2020  2058 This is a 49 year old male with a history of high cholesterol, hypertension, presenting to emergency department with transient left hand weakness.  The patient reports he was driving today and felt like his entire left hand went numb, it is not clear how long this lasted.  The symptoms have subsequently resolved.  While waiting in our waiting room, he reports he developed sharp, upper left-sided chest pain, which he has never had before.  The pain is minimal and is gone away.  The patient's cardiac work-up was unremarkable.  His EKG shows a normal sinus rhythm and a troponin is 5.  I have a very low suspicion for acute coronary syndrome with his description of chest pain.  This is more likely nerve pain  from his description.  His CT scan of the brain did not show any evidence of acute stroke.  The patient does report that his significant family history  of stroke with a brother age 95 having a stroke in the past year.  PA provider spoke with neurologist regarding the possibility of TIA, and they had recommended transfer to the [facility] ED for an MRI of the brain.  Patient is agreeable to do this.  His wife is present at the bedside and will drive him directly to the ED.  An MRI has been ordered.  I do think is safe for him to go by private vehicle given that he is going directly to the ER, and has no active symptoms.  If his MRI is negative, anticipate discharge home, and suspect that his symptoms may have been due to peripheral nerve compression.   [MT]    Clinical Course User Index [MT] Wyvonnia Dusky, MD   MDM Rules/Calculators/A&P                          49 year old man presents with left hand numbness this morning around 1030 or 11, unsure how long this lasted but it is since resolved and he has no other neurologic symptoms. Did develop some left-sided chest pain while in the waiting room. On arrival he is mildly hypertensive but vitals otherwise unremarkable. Patient has no prior history of similar left hand numbness I am unable to reproduce it with Phalen's or Tinel's test, or with percussion of the cubital tunnel, so unable to isolate peripheral compression. Without clear story for peripheral distribution concern for potential TIA. Patient also reports some atypical left upper sharp chest pain. Will get chest x-ray, EKG, labs including troponin and CT of the head.  Chest x-ray is unremarkable and CT of the head without any acute abnormalities.  Case discussed with Dr. Malen Gauze with neurology who agrees with plan for cardiac work-up, and recommends MRI, but does not think patient necessarily needs to be admitted for TIA work-up, would get MRI before making any decisions on admission.  Patient's  cardiac work-up has been very reassuring, EKG without ischemic changes, chest x-ray is clear, initial troponin is negative and lab work with no electrolyte derangements, or leukocytosis.  Case discussed with Dr. Gerlene Fee in the emergency department at [facility] who accepts the patient for ED to ED transfer for MRI. Neurology did not immediately want the patient to be admitted for TIA work-up, but wanted to get MRI first, they can be reconsulted after MRI.  Patient's wife will transport him by private vehicle to [facility] for MRI and further evaluation. He expresses understanding and agreement with this plan. He has been instructed to go straight to the hospital and not make any stops.  Final Clinical Impression(s) / ED Diagnoses Final diagnoses:  Numbness and tingling in left hand  Left-sided chest pain    Rx / DC Orders ED Discharge Orders    None       Jacqlyn Larsen, [place] 03/10/20 0011    Wyvonnia Dusky, MD 03/10/20 [phone]

## 2020-03-09 NOTE — ED Notes (Signed)
Pt states had some tingling in his left hand about 1030 11 this am it comes and goes then pt states he was sitting out front and  Had some cp, no n/v/sob

## 2020-03-09 NOTE — Telephone Encounter (Signed)
noted 

## 2020-03-09 NOTE — ED Notes (Signed)
Report called to Agricultural consultant at [employer].

## 2020-03-09 NOTE — Telephone Encounter (Signed)
Patient called in for advice on what to do. He was at work and his left arm was tingling. Patient transferred to [facility].

## 2020-03-09 NOTE — ED Notes (Signed)
Pt. 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  Pt. Is having no pain at all at this time only dreading the IV stick.

## 2020-03-09 NOTE — Discharge Instructions (Addendum)
Thank you for allowing me to care for you today in the Emergency Department.   You were seen today for numbness and tingling in your left upper extremity.  You had an MRI of your brain, which was reviewed by neurology.  It did not show evidence of stroke.  It is possible that the numbers could be coming from your neck, but if you continue to have recurrent episodes persist, you can call and schedule a follow-up appointment with neurology in the clinic.  Your work-up for chest pain was also reassuring today.  However, this work-up cannot treat disease.  You can call to schedule a follow-up appointment with cardiology.  Their contact information is listed above.  You should return to the emergency department if he develop new or worsening symptoms including weakness, numbness in your other areas of your body, chest pain with sweating, significant shortness of breath, if you pass out, if your fingers or lips turn blue, or if you develop other new, concerning symptoms.

## 2020-03-10 NOTE — ED Provider Notes (Signed)
49 year old male received as a transfer from [place] from [facility] pending MRI brain.  In brief, this is a patient with a history of hypercholesteremia, HTN, and obesity presenting with paresthesias in the left hand that began earlier today while he was driving.  Symptoms have since resolved.  While he was waiting in the ER, he suddenly developed sharp, left-sided upper chest pain, which was new per the patient.  Pain had resolved by the time he was seen by a clinician.  His cardiac work-up was reassuring.  He had a negative head CT.  However, given a family history of a stroke at his brother at age 16, neurology was consulted and he was advised to have an MR brain for possible TIA.   Physical Exam  BP (!) 149/87   Pulse 66   Temp 97.8 F (36.6 C) (Oral)   Resp 17   Ht 6\' 1"  (1.854 m)   Wt 104 kg   SpO2 96%   BMI 30.25 kg/m   Physical Exam Vitals and nursing note reviewed.  Constitutional:      Appearance: He is well-developed.  HENT:     Head: Normocephalic and atraumatic.  Cardiovascular:     Rate and Rhythm: Normal rate.     Pulses: Normal pulses.     Heart sounds: Normal heart sounds. No murmur heard.  No friction rub. No gallop.   Pulmonary:     Effort: Pulmonary effort is normal.  Musculoskeletal:     Cervical back: Neck supple.  Neurological:     Mental Status: He is alert and oriented to person, place, and time.     Cranial Nerves: No cranial nerve deficit.     Comments: Ambulates without difficulty.  Moves all 4 extremities spontaneously.  5 of 5 strength against resistance of the bilateral upper and lower extremities.  Sensation is intact and equal throughout.  Psychiatric:        Behavior: Behavior normal.     ED Course/Procedures   Clinical Course as of Mar 11 239  Mon Mar 09, 2020  2058 This is a 49 year old male with a history of high cholesterol, hypertension, presenting to emergency department with transient left hand weakness.  The patient  reports he was driving today and felt like his entire left hand went numb, it is not clear how long this lasted.  The symptoms have subsequently resolved.  While waiting in our waiting room, he reports he developed sharp, upper left-sided chest pain, which he has never had before.  The pain is minimal and is gone away.  The patient's cardiac work-up was unremarkable.  His EKG shows a normal sinus rhythm and a troponin is 5.  I have a very low suspicion for acute coronary syndrome with his description of chest pain.  This is more likely nerve pain from his description.  His CT scan of the brain did not show any evidence of acute stroke.  The patient does report that his significant family history of stroke with a brother age 48 having a stroke in the past year.  PA provider spoke with neurologist regarding the possibility of TIA, and they had recommended transfer to the [facility] ED for an MRI of the brain.  Patient is agreeable to do this.  His wife is present at the bedside and will drive him directly to the ED.  An MRI has been ordered.  I do think is safe for him to go by private vehicle given that he  is going directly to the ER, and has no active symptoms.  If his MRI is negative, anticipate discharge home, and suspect that his symptoms may have been due to peripheral nerve compression.   [MT]    Clinical Course User Index [MT] Wyvonnia Dusky, MD    Procedures  MDM  49 year old male with a history of hypertension, obesity, and hyperlipidemia received as a transfer from [place] for MR brain from [facility].  Please see her note for further work-up and medical decision making.  Patient was having chest pain that has previously resolved.  Thought to be low suspicion for ACS given his reassuring EKG with normal sinus rhythm and troponin of 5.  He remains hemodynamically stable and in no acute distress.  Doubt aortic dissection.  MRI is unremarkable.  MRI was discussed with Dr. Rory Percy,  neurology, he is previously spoke with PA forward.  Upon further review of the patient's chart, he has a history of cervical radiculopathy.  His blood pressure has not been significantly elevated.  At this time, Dr. Rory Percy does not feel that admission is warranted for TIA evaluation as symptoms are most likely secondary to cervical radiculopathy.  Dr. Rory Percy does recommend outpatient neurology follow-up if paresthesias return or persist. I discussed MRI findings and recommendations with the patient who is agreeable with the plan at this time.    Given his cardiac risk factors, will also refer the patient to outpatient cardiology.  All questions answered.  He is hemodynamically stable and in no acute distress.  Safe for discharge to home with outpatient follow-up as indicated.      Joanne Gavel, PA-C 03/10/20 [place], [place], DO 03/10/20 [phone]

## 2020-03-10 NOTE — Progress Notes (Signed)
CARDIOLOGY CONSULT NOTE       Patient ID: Russell Ortiz MRN: [id number] DOB/AGE: Oct 10, 1970 49 y.o.  Admit date: (Not on file) Referring Physician: Raoul Pitch Primary Physician: Ma Hillock, DO Primary Cardiologist: New Reason for Consultation: Chest Pain  Active Problems:   * No active hospital problems. *   HPI:  49 y.o. history of HLD,HTN,  PUD referred by Dr Raoul Pitch and [facility] ER for chest pain. Reviewed notes from 03/10/20 patient complained of paresthesias in left hand while driving resolved spontaneously Then developed sharp left sided chest pain He r/o for MI MRI and CT head negative for stroke/TIA. CXR NAD ECG 10/5 with SR rate 50 and nonspecific precordial T wave changes BP was elevated at 149/87 mmHg but does not appear to be Rx with HR 66 He also has history of cervical radiculopathy He takes zetia for his cholesterol Notes allergies to statins with myalgias  He is a truck driver Married lives with wife and has 4 kids ages 11-26 Quit smoking in 1999  Has not had further pains since d/c   ROS All other systems reviewed and negative except as noted above  Past Medical History:  Diagnosis Date  . Genital warts   . Hyperlipidemia   . Mass of left thigh 04/06/2018  . PUD (peptic ulcer disease)     Family History  Problem Relation Age of Onset  . Lung cancer Father   . Asthma Brother   . Diabetes Maternal Grandfather   . Diabetes Maternal Uncle   . Hyperlipidemia Maternal Grandmother   . Colon cancer Paternal Uncle   . Prostate cancer Paternal Uncle   . Stomach cancer Neg Hx   . Esophageal cancer Neg Hx   . Colon polyps Neg Hx     Social History   Socioeconomic History  . Marital status: Married    Spouse name: Not on file  . Number of children: 1  . Years of education: 46  . Highest education level: Not on file  Occupational History  . Occupation: truck Geophysicist/field seismologist  Tobacco Use  . Smoking status: Former Smoker    Quit date: 06/06/1997    Years since quitting:  22.7  . Smokeless tobacco: Never Used  Vaping Use  . Vaping Use: Never used  Substance and Sexual Activity  . Alcohol use: Yes    Alcohol/week: 10.0 standard drinks    Types: 4 Shots of liquor, 6 Cans of beer per week    Comment: socially  . Drug use: No  . Sexual activity: Yes    Partners: Female    Comment: married  Other Topics Concern  . Not on file  Social History Narrative   Married. 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    Wears seat belt. Exercises 3x or greater a week.    Former Smoker. Quit 1999, Occasional ETOH, no recreational drugs.    Drinks caffeine beverages. Uses herbal remedies. Takes a multivitamin.    Smoker alarm in the home.    Feels safe in his relationships.    Social Determinants of Health   Financial Resource Strain:   . Difficulty of Paying Living Expenses: Not on file  Food Insecurity:   . Worried About Charity fundraiser in the Last Year: Not on file  . Ran Out of Food in the Last Year: Not on file  Transportation Needs:   . Lack of Transportation (Medical): Not on file  .  Lack of Transportation (Non-Medical): Not on file  Physical Activity:   . Days of Exercise per Week: Not on file  . Minutes of Exercise per Session: Not on file  Stress:   . Feeling of Stress : Not on file  Social Connections:   . Frequency of Communication with Friends and Family: Not on file  . Frequency of Social Gatherings with Friends and Family: Not on file  . Attends Religious Services: Not on file  . Active Member of Clubs or Organizations: Not on file  . Attends Archivist Meetings: Not on file  . Marital Status: Not on file  Intimate Partner Violence:   . Fear of Current or Ex-Partner: Not on file  . Emotionally Abused: Not on file  . Physically Abused: Not on file  . Sexually Abused: Not on file    Past Surgical History:  Procedure Laterality Date  . VASECTOMY  2010      Current Outpatient  Medications:  .  ezetimibe (ZETIA) 10 MG tablet, Take 1 tablet (10 mg total) by mouth daily., Disp: 90 tablet, Rfl: 1 .  Flaxseed, Linseed, (FLAX SEED OIL PO), Take 1,000 mg by mouth daily., Disp: , Rfl:  .  hydrocortisone 1 % ointment, Apply 1 application topically 2 (two) times daily., Disp: 30 g, Rfl: 2 .  Multiple Vitamins-Minerals (MULTIVITAMIN ADULT) CHEW, Chew by mouth., Disp: , Rfl:  .  predniSONE (DELTASONE) 20 MG tablet, 60 mg x3d, 40 mg x3d, 20 mg x2d, 10 mg x2d, Disp: 18 tablet, Rfl: 0 .  triamcinolone cream (KENALOG) 0.1 %, Apply 1 application topically 2 (two) times daily., Disp: 80 g, Rfl: 2    Physical Exam: There were no vitals taken for this visit.    Affect appropriate Healthy:  appears stated age 54: normal Neck supple with no adenopathy JVP normal no bruits no thyromegaly Lungs clear with no wheezing and good diaphragmatic motion Heart:  S1/S2 no murmur, no rub, gallop or click PMI normal Abdomen: benighn, BS positve, no tenderness, no AAA no bruit.  No HSM or HJR Distal pulses intact with no bruits No edema Neuro non-focal Skin warm and dry No muscular weakness   Labs:   Lab Results  Component Value Date   WBC 4.5 03/09/2020   HGB 15.5 03/09/2020   HCT 48.4 03/09/2020   MCV 86.6 03/09/2020   PLT 175 03/09/2020    Recent Labs  Lab 03/09/20 1938  NA 137  K 3.7  CL 100  CO2 27  BUN 9  CREATININE 1.23  CALCIUM 10.0  GLUCOSE 91   No results found for: CKTOTAL, CKMB, CKMBINDEX, TROPONINI  Lab Results  Component Value Date   CHOL 238 (H) 07/26/2019   CHOL 279 (H) 04/22/2019   CHOL 270 (H) 04/13/2018   Lab Results  Component Value Date   HDL 62.50 07/26/2019   HDL 55.80 04/22/2019   HDL 63.90 04/13/2018   Lab Results  Component Value Date   LDLCALC 153 (H) 07/26/2019   LDLCALC 194 (H) 04/22/2019   LDLCALC 184 (H) 04/13/2018   Lab Results  Component Value Date   TRIG 112.0 07/26/2019   TRIG 145.0 04/22/2019   TRIG 112.0  04/13/2018   Lab Results  Component Value Date   CHOLHDL 4 07/26/2019   CHOLHDL 5 04/22/2019   CHOLHDL 4 04/13/2018   Lab Results  Component Value Date   LDLDIRECT 231.1 03/29/2012   LDLDIRECT 182.8 06/23/2009   LDLDIRECT 141.8 08/15/2007  Radiology: DG Chest 2 View  Result Date: 03/09/2020 CLINICAL DATA:  Chest pain.  Intermittent tingling in left hand. EXAM: CHEST - 2 VIEW COMPARISON:  Radiograph 04/07/2016 FINDINGS: The cardiomediastinal contours are normal. Mild chronic elevation of right hemidiaphragm. Pulmonary vasculature is normal. No consolidation, pleural effusion, or pneumothorax. No acute osseous abnormalities are seen. IMPRESSION: No acute chest findings. Electronically Signed   By: Keith Rake M.D.   On: 03/09/2020 19:23   CT Head Wo Contrast  Result Date: 03/09/2020 CLINICAL DATA:  Tingling of the left hand today.  Intermittent. EXAM: CT HEAD WITHOUT CONTRAST TECHNIQUE: Contiguous axial images were obtained from the base of the skull through the vertex without intravenous contrast. COMPARISON:  None. FINDINGS: Brain: The brain shows a normal appearance without evidence of malformation, atrophy, old or acute small or large vessel infarction, mass lesion, hemorrhage, hydrocephalus or extra-axial collection. Vascular: No hyperdense vessel. No evidence of atherosclerotic calcification. Skull: Normal.  No traumatic finding.  No focal bone lesion. Sinuses/Orbits: Sinuses are clear. Orbits appear normal. Mastoids are clear. Other: None significant IMPRESSION: Normal head CT. Electronically Signed   By: Nelson Chimes M.D.   On: 03/09/2020 19:25   MR BRAIN WO CONTRAST  Result Date: 03/09/2020 CLINICAL DATA:  Initial evaluation for acute left upper extremity tingling. EXAM: MRI HEAD WITHOUT CONTRAST TECHNIQUE: Multiplanar, multiecho pulse sequences of the brain and surrounding structures were obtained without intravenous contrast. COMPARISON:  Prior head CT from earlier the  same day. FINDINGS: Brain: Cerebral volume within normal limits for patient age. No focal parenchymal signal abnormality identified. No abnormal foci of restricted diffusion to suggest acute or subacute ischemia. Gray-white matter differentiation well maintained. No encephalomalacia to suggest chronic infarction. No foci of susceptibility artifact to suggest acute or chronic intracranial hemorrhage. No mass lesion, midline shift or mass effect. No hydrocephalus. No extra-axial fluid collection. Major dural sinuses are grossly patent. Pituitary gland and suprasellar region are normal. Midline structures intact and normal. Vascular: Major intracranial vascular flow voids well maintained and normal in appearance. Skull and upper cervical spine: Craniocervical junction normal. Visualized upper cervical spine within normal limits. Bone marrow signal intensity normal. No scalp soft tissue abnormality. Sinuses/Orbits: Globes and orbital soft tissues within normal limits. Paranasal sinuses are clear. No mastoid effusion. Inner ear structures normal. Other: None. IMPRESSION: Normal brain MRI. No acute intracranial abnormality identified. Electronically Signed   By: Jeannine Boga M.D.   On: 03/09/2020 23:58    EKG: See HPI  SR rate 50 nonspecific precordial T wave changes    ASSESSMENT AND PLAN:   1. Chest Pain: atypical r/o CXR NAD ECG with nonspecific changes discussed options and favor Cardiac CTA to risk stratify and r/o CAD.  2. HLD: intolerant to statin on zetia LDL 153 07/26/19  May need to consider nexletol if calcium score high  3. HTN:  Diet/exercise don't think meds needed at this time   Signed: Jenkins Rouge 03/10/2020, 3:01 PM

## 2020-03-10 NOTE — ED Notes (Signed)
Pt verbalized understanding of d/c instructions and follow up. Pt ambulatory to [place] with steady gait.

## 2020-03-12 ENCOUNTER — Ambulatory Visit: Payer: No Typology Code available for payment source | Admitting: Cardiovascular Disease

## 2020-03-12 ENCOUNTER — Other Ambulatory Visit: Payer: Self-pay

## 2020-03-12 ENCOUNTER — Other Ambulatory Visit: Payer: Self-pay | Admitting: Cardiovascular Disease

## 2020-03-12 ENCOUNTER — Encounter: Payer: Self-pay | Admitting: Cardiovascular Disease

## 2020-03-12 VITALS — BP 120/90 | HR 60 | Ht 73.0 in | Wt 230.4 lb

## 2020-03-12 DIAGNOSIS — R079 Chest pain, unspecified: Secondary | ICD-10-CM

## 2020-03-12 MED ORDER — METOPROLOL TARTRATE 100 MG PO TABS: ORAL_TABLET | ORAL | 0 refills | Status: DC

## 2020-03-12 MED FILL — METOPROLOL TARTRATE 100 MG: 100 | 1 days supply | Qty: 1 | Fill #0

## 2020-03-12 NOTE — Patient Instructions (Addendum)
Medication Instructions:  *If you need a refill on your cardiac medications before your next appointment, please call your pharmacy*  Lab Work: If you have labs (blood work) drawn today and your tests are completely normal, you will receive your results only by: [name] MyChart Message (if you have MyChart) OR . A paper copy in the mail If you have any lab test that is abnormal or we need to change your treatment, we will call you to review the results.  Testing/Procedures: Your physician has requested that you have cardiac CT. Cardiac computed tomography (CT) is a painless test that uses an x-ray machine to take clear, detailed pictures of your heart. For further information please visit [email or web address].[email or web address]. Please follow instruction sheet as given.  Follow-Up: At [facility], you and your health needs are our priority.  As part of our continuing mission to provide you with exceptional heart care, we have created designated Provider Care Teams.  These Care Teams include your primary Cardiologist (physician) and Advanced Practice Providers (APPs -  Physician Assistants and Nurse Practitioners) who all work together to provide you with the care you need, when you need it.  We recommend signing up for the patient portal called "MyChart".  Sign up information is provided on this After Visit Summary.  MyChart is used to connect with patients for Virtual Visits (Telemedicine).  Patients are able to view lab/test results, encounter notes, upcoming appointments, etc.  Non-urgent messages can be sent to your provider as well.   To learn more about what you can do with MyChart, go to [email or web address].    Your next appointment:   As needed  The format for your next appointment:   In Person  Provider:   You may see Dr. Johnsie Cancel or one of the following Advanced Practice Providers on your designated Care Team:    Truitt Merle, NP  Cecilie Kicks, NP  Kathyrn Drown, NP   Your cardiac CT will be  scheduled at one of the below locations:   [facility] [address] [place], [place] [address] [phone]  If scheduled at [facility], please arrive at the [facility] main entrance of [facility] 30 minutes prior to test start time. Proceed to the [facility] Radiology Department (first floor) to check-in and test prep.  Please follow these instructions carefully (unless otherwise directed):  Hold all erectile dysfunction medications at least 3 days (72 hrs) prior to test.  On the Night Before the Test: . Be sure to Drink plenty of water. . Do not consume any caffeinated/decaffeinated beverages or chocolate 12 hours prior to your test. . Do not take any antihistamines 12 hours prior to your test.  On the Day of the Test: . Drink plenty of water. Do not drink any water within one hour of the test. . Do not eat any food 4 hours prior to the test. . You may take your regular medications prior to the test.  . Take metoprolol (Lopressor) 100 mg  two hours prior to test.  After the Test: . Drink plenty of water. . After receiving IV contrast, you may experience a mild flushed feeling. This is normal. . On occasion, you may experience a mild rash up to 24 hours after the test. This is not dangerous. If this occurs, you can take Benadryl 25 mg and increase your fluid intake. . If you experience trouble breathing, this can be serious. If it is severe call 911 IMMEDIATELY. If it is  mild, please call our office.   Once we have confirmed authorization from your insurance company, we will call you to set up a date and time for your test. Based on how quickly your insurance processes prior authorizations requests, please allow up to 4 weeks to be contacted for scheduling your Cardiac CT appointment. Be advised that routine Cardiac CT appointments could be scheduled as many as 8 weeks after your provider has ordered it.  For non-scheduling related questions,  please contact the cardiac imaging nurse navigator should you have any questions/concerns: Marchia Bond, Cardiac Imaging Nurse Navigator Burley Saver, Interim Cardiac Imaging Nurse [place] and Vascular Services Direct Office Dial: [phone]   For scheduling needs, including cancellations and rescheduling, please call Vivien Rota at [phone], option 3.

## 2020-03-13 ENCOUNTER — Encounter: Payer: Self-pay | Admitting: Neurology

## 2020-03-13 ENCOUNTER — Ambulatory Visit (INDEPENDENT_AMBULATORY_CARE_PROVIDER_SITE_OTHER): Payer: No Typology Code available for payment source | Admitting: Neurology

## 2020-03-13 VITALS — BP 137/90 | HR 70 | Ht 73.0 in | Wt 230.0 lb

## 2020-03-13 DIAGNOSIS — M5412 Radiculopathy, cervical region: Secondary | ICD-10-CM

## 2020-03-13 NOTE — Patient Instructions (Signed)
Start neck physiotherapy  If your symptoms get worse, please call my office and we can order MRI sooner  Return to clinic in 3 months

## 2020-03-13 NOTE — Progress Notes (Signed)
[facility] Clinic Note - Initial Visit   Date: 03/13/20  Russell Ortiz MRN: [id number] DOB: 06/26/1970   Dear Dr. Raoul Pitch:  Thank you for your kind referral of Russell Ortiz for consultation of left hand tingling. Although his history is well known to you, please allow [place] to reiterate it for the purpose of our medical record. The patient was accompanied to the clinic by self.    History of Present Illness: Russell Ortiz is a 49 y.o. right-handed male with hyperlipidemia presenting for evaluation of left hand tingling.   Starting earlier this week, he began having spells of numbness/tingling over the back of the hand and fingers.  It lasts about 1-2 minutes, which occurs several times per week. He has noticed that stretching his neck to the right seems to alleviate his symptoms.  He denies numbness/tinglinig in the right hand.  No hand weakness or neck pain.    Out-side paper records, electronic medical record, and images have been reviewed where available and summarized as:  Lab Results  Component Value Date   HGBA1C 5.9 04/22/2019   No results found for: [id number] Lab Results  Component Value Date   TSH 3.15 04/22/2019   No results found for: ESRSEDRATE, POCTSEDRATE  Past Medical History:  Diagnosis Date  . Genital warts   . Hyperlipidemia   . Mass of left thigh 04/06/2018  . PUD (peptic ulcer disease)     Past Surgical History:  Procedure Laterality Date  . VASECTOMY  2010     Medications:  Outpatient Encounter Medications as of 03/13/2020  Medication Sig  . APPLE CIDER VINEGAR PO Take by mouth.  . ezetimibe (ZETIA) 10 MG tablet Take 1 tablet (10 mg total) by mouth daily.  . Flaxseed, Linseed, (FLAX SEED OIL PO) Take 1,000 mg by mouth daily.  . metoprolol tartrate (LOPRESSOR) 100 MG tablet Take one tablet by mouth two hours prior to CT  . Multiple Vitamins-Minerals (MULTIVITAMIN ADULT EXTRA C PO) Take by mouth.  . Multiple  Vitamins-Minerals (MULTIVITAMIN ADULT) CHEW Chew by mouth.  . triamcinolone cream (KENALOG) 0.1 % Apply 1 application topically 2 (two) times daily.  . Zinc 50 MG TABS Take by mouth.  . hydrocortisone 1 % ointment Apply 1 application topically 2 (two) times daily.  . predniSONE (DELTASONE) 20 MG tablet 60 mg x3d, 40 mg x3d, 20 mg x2d, 10 mg x2d   No facility-administered encounter medications on file as of 03/13/2020.    Allergies:  Allergies  Allergen Reactions  . Statins Other (See Comments)    mylagia    Family History: Family History  Problem Relation Age of Onset  . Lung cancer Father   . Asthma Brother   . Diabetes Maternal Grandfather   . Diabetes Maternal Uncle   . Hyperlipidemia Maternal Grandmother   . Colon cancer Paternal Uncle   . Prostate cancer Paternal Uncle   . Stomach cancer Neg Hx   . Esophageal cancer Neg Hx   . Colon polyps Neg Hx     Social History: Social History   Tobacco Use  . Smoking status: Former Smoker    Quit date: 06/06/1997    Years since quitting: 22.7  . Smokeless tobacco: Never Used  Vaping Use  . Vaping Use: Never used  Substance Use Topics  . Alcohol use: Yes    Alcohol/week: 10.0 standard drinks    Types: 6 Cans of beer, 4 Shots of liquor per week    Comment:  socially  . Drug use: No   Social History   Social History Narrative   Married. 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    Wears seat belt. Exercises 3x or greater a week.    Former Smoker. Quit 1999, Occasional ETOH, no recreational drugs.    Drinks caffeine beverages. Uses herbal remedies. Takes a multivitamin.    Smoker alarm in the home.    Feels safe in his relationships.    Right Handed   Lives in one story home       Vital Signs:  BP 137/90   Pulse 70   Ht 6\' 1"  (1.854 m)   Wt 230 lb (104.3 kg)   SpO2 98%   BMI 30.34 kg/m    Neurological Exam: MENTAL STATUS including orientation to time, place, person,  recent and remote memory, attention span and concentration, language, and fund of knowledge is normal.  Speech is not dysarthric.  CRANIAL NERVES: II:  No visual field defects.  III-IV-VI: Pupils equal round and reactive to light.  Normal conjugate, extra-ocular eye movements in all directions of gaze.  No nystagmus.  No ptosis.   V:  Normal facial sensation.    VII:  Normal facial symmetry and movements.   VIII:  Normal hearing and vestibular function.   IX-X:  Normal palatal movement.   XI:  Normal shoulder shrug and head rotation.   XII:  Normal tongue strength and range of motion, no deviation or fasciculation.  MOTOR:  No atrophy, fasciculations or abnormal movements.  No pronator drift.   Upper Extremity:  Right  Left  Deltoid  5/5   5/5   Biceps  5/5   5/5   Triceps  5/5   5/5   Infraspinatus 5/5  5/5  Medial pectoralis 5/5  5/5  Wrist extensors  5/5   5/5   Wrist flexors  5/5   5/5   Finger extensors  5/5   5/5   Finger flexors  5/5   5/5   Dorsal interossei  5/5   5/5   Abductor pollicis  5/5   5/5   Tone (Ashworth scale)  0  0   Lower Extremity:  Right  Left  Hip flexors  5/5   5/5   Hip extensors  5/5   5/5   Adductor 5/5  5/5  Abductor 5/5  5/5  Knee flexors  5/5   5/5   Knee extensors  5/5   5/5   Dorsiflexors  5/5   5/5   Plantarflexors  5/5   5/5   Toe extensors  5/5   5/5   Toe flexors  5/5   5/5   Tone (Ashworth scale)  0  0   MSRs:  Right        Left                  brachioradialis 3+  2+  biceps 2+  2+  triceps 2+  2+  patellar 2+  2+  ankle jerk 2+  2+  Hoffman no  no  plantar response down  down   SENSORY:  Normal and symmetric perception of light touch, pinprick, vibration, and proprioception.    COORDINATION/GAIT: Normal finger-to- nose-finger.  Intact rapid alternating movements bilaterally.  Gait narrow based and stable. Tandem and stressed gait intact.    IMPRESSION: Left hand paresthesias involving C7 dermatome, most suggestive of  cervical radiculopathy  - Start  neck PT  - MRI cervical spine, if no improvement  Return to clinic in 3 months.   Thank you for allowing me to participate in patient's care.  If I can answer any additional questions, I would be pleased to do so.    Sincerely,    Russell Angelo K. Posey Pronto, DO

## 2020-04-02 ENCOUNTER — Telehealth (HOSPITAL_COMMUNITY): Payer: Self-pay | Admitting: Emergency Medicine

## 2020-04-02 NOTE — Telephone Encounter (Signed)
Reaching out to patient to offer assistance regarding upcoming cardiac imaging study; pt verbalizes understanding of appt date/time, parking situation and where to check in, pre-test NPO status and medications ordered, and verified current allergies; name and call back number provided for further questions should they arise Marchia Bond RN Navigator Cardiac Imaging [place] and Vascular [phone] office [phone] cell  Pt verbalized understanding to check in at 730a and to take metoprolol 2 hr prior to scan. Avoiding antihistamines and ED medications. Clarise Cruz

## 2020-04-03 ENCOUNTER — Other Ambulatory Visit: Payer: Self-pay

## 2020-04-03 ENCOUNTER — Ambulatory Visit (HOSPITAL_COMMUNITY)
Admission: RE | Admit: 2020-04-03 | Discharge: 2020-04-03 | Disposition: A | Discharge status: Home / Self Care | Payer: No Typology Code available for payment source | Source: Ambulatory Visit | Attending: Cardiovascular Disease | Admitting: Cardiovascular Disease

## 2020-04-03 ENCOUNTER — Encounter: Payer: Self-pay | Admitting: *Deleted

## 2020-04-03 DIAGNOSIS — R079 Chest pain, unspecified: Secondary | ICD-10-CM

## 2020-04-03 DIAGNOSIS — Z006 Encounter for examination for normal comparison and control in clinical research program: Secondary | ICD-10-CM

## 2020-04-03 MED ORDER — NITROGLYCERIN 0.4 MG SL SUBL
0.8000 mg | SUBLINGUAL_TABLET | Freq: Once | SUBLINGUAL | Status: AC
  Administered 2020-04-03: 0.8 mg via SUBLINGUAL

## 2020-04-03 MED ORDER — NITROGLYCERIN 0.4 MG SL SUBL
SUBLINGUAL_TABLET | SUBLINGUAL | Status: AC
  Filled 2020-04-03: qty 2

## 2020-04-03 MED ORDER — IOHEXOL 350 MG/ML SOLN
80.0000 mL | Freq: Once | INTRAVENOUS | Status: AC | PRN
  Administered 2020-04-03: 80 mL via INTRAVENOUS

## 2020-04-03 NOTE — Research (Signed)
CADFEM Informed Consent                  Subject Name:   Russell Ortiz   Subject met inclusion and exclusion criteria.  The informed consent form, study requirements and expectations were reviewed with the subject and questions and concerns were addressed prior to the signing of the consent form.  The subject verbalized understanding of the trial requirements.  The subject agreed to participate in the CADFEM trial and signed the informed consent.  The informed consent was obtained prior to performance of any protocol-specific procedures for the subject.  A copy of the signed informed consent was given to the subject and a copy was placed in the subject's medical record.   [place] Lakai Moree, Research Assistant  04/03/2020 07:08 a.m.

## 2020-04-21 ENCOUNTER — Other Ambulatory Visit: Payer: Self-pay

## 2020-04-22 ENCOUNTER — Encounter: Payer: Self-pay | Admitting: Family Medicine

## 2020-04-22 ENCOUNTER — Ambulatory Visit (INDEPENDENT_AMBULATORY_CARE_PROVIDER_SITE_OTHER): Payer: No Typology Code available for payment source | Admitting: Family Medicine

## 2020-04-22 ENCOUNTER — Telehealth: Payer: Self-pay | Admitting: Family Medicine

## 2020-04-22 ENCOUNTER — Other Ambulatory Visit: Payer: Self-pay | Admitting: Family Medicine

## 2020-04-22 VITALS — BP 135/80 | HR 66 | Temp 97.6°F | Ht 72.25 in | Wt 230.0 lb

## 2020-04-22 DIAGNOSIS — E785 Hyperlipidemia, unspecified: Secondary | ICD-10-CM

## 2020-04-22 DIAGNOSIS — Z23 Encounter for immunization: Secondary | ICD-10-CM

## 2020-04-22 DIAGNOSIS — E663 Overweight: Secondary | ICD-10-CM

## 2020-04-22 DIAGNOSIS — Z0000 Encounter for general adult medical examination without abnormal findings: Secondary | ICD-10-CM | POA: Diagnosis not present

## 2020-04-22 DIAGNOSIS — R7309 Other abnormal glucose: Secondary | ICD-10-CM | POA: Diagnosis not present

## 2020-04-22 DIAGNOSIS — Z125 Encounter for screening for malignant neoplasm of prostate: Secondary | ICD-10-CM | POA: Diagnosis not present

## 2020-04-22 LAB — LIPID PANEL
Cholesterol: 294 mg/dL — ABNORMAL HIGH (ref 0–200)
HDL: 64.2 mg/dL (ref >39.00)
LDL Cholesterol: 211 mg/dL — ABNORMAL HIGH (ref 0–99)
NonHDL: 229.35
Total CHOL/HDL Ratio: 5
Triglycerides: 90 mg/dL (ref 0.0–149.0)
VLDL: 18 mg/dL (ref 0.0–40.0)

## 2020-04-22 LAB — PSA: PSA: 0.2 ng/mL (ref 0.10–4.00)

## 2020-04-22 LAB — HEMOGLOBIN A1C: Hgb A1c MFr Bld: 6.1 % (ref 4.6–6.5)

## 2020-04-22 LAB — TSH: TSH: 1.56 u[IU]/mL (ref 0.35–4.50)

## 2020-04-22 MED ORDER — EZETIMIBE 10 MG PO TABS
10.0000 mg | ORAL_TABLET | Freq: Every day | ORAL | 1 refills | Status: DC
Start: 2020-04-22 — End: 2020-04-22

## 2020-04-22 MED ORDER — REPATHA SURECLICK 140 MG/ML ~~LOC~~ SOAJ: 1.0000 mL | SUBCUTANEOUS | 11 refills | Status: DC

## 2020-04-22 NOTE — Progress Notes (Signed)
This visit occurred during the SARS-CoV-2 public health emergency.  Safety protocols were in place, including screening questions prior to the visit, additional usage of staff PPE, and extensive cleaning of exam room while observing appropriate contact time as indicated for disinfecting solutions.    Patient ID: Russell Ortiz, male  DOB: 1970/06/24, 49 y.o.   MRN: [id number] Patient Care Team    Relationship Specialty Notifications Start End  Ma Hillock, DO PCP - General Family Medicine  08/17/15   Pyrtle, Lajuan Lines, MD Consulting Physician Gastroenterology  04/22/19   Alda Berthold, [place] Physician Neurology  03/13/20     Chief Complaint  Patient presents with  . Annual Exam    pt is fasting    Subjective: Russell Ortiz is a 49 y.o.  male  present for CPE . All past medical history, surgical history, allergies, family history, immunizations, medications and social history were updated in the electronic medical record today. All recent labs, ED visits and hospitalizations within the last year were reviewed.  Health maintenance:  Colonoscopy:[name] with a P-uncle colon cancer at 41. Completed 06/01/2018, Dr. Hilarie Fredrickson 5 yr follow up.  Immunizations: tdap UTD 04/2018, influenza provided today. covid series completed.  Infectious disease screening: HIV2016 PSA: no fhx. Psa normal last year. Collected today Assistive device: none Oxygen WUJ:WJXB Patient has a Dental home. Hospitalizations/ED visits: reviewed  Depression screen [facility] 2/9 04/22/2020 04/22/2019 04/13/2018 09/14/2017 04/12/2017  Decreased Interest 0 0 0 0 0  Down, Depressed, Hopeless 0 0 0 0 0  PHQ - 2 Score 0 0 0 0 0   No flowsheet data found.   Immunization History  Administered Date(s) Administered  . Influenza Split 03/29/2012  . Influenza,inj,Quad PF,6+ Mos 03/17/2016, 04/13/2018, 04/22/2019, 04/22/2020  . PFIZER SARS-COV-2 Vaccination 09/07/2019, 10/02/2019  . Td 08/15/2007  . Tdap 04/13/2018    Past  Medical History:  Diagnosis Date  . Genital warts   . Hyperlipidemia   . Mass of left thigh 04/06/2018  . PUD (peptic ulcer disease)    Allergies  Allergen Reactions  . Statins Other (See Comments)    mylagia   Past Surgical History:  Procedure Laterality Date  . VASECTOMY  2010   Family History  Problem Relation Age of Onset  . Lung cancer Father   . Asthma Brother   . Stroke Brother   . Diabetes Maternal Grandfather   . Diabetes Maternal Uncle   . Hyperlipidemia Maternal Grandmother   . Colon cancer Paternal Uncle   . Prostate cancer Paternal Uncle   . Stomach cancer Neg Hx   . Esophageal cancer Neg Hx   . Colon polyps Neg Hx    Social History   Social History Narrative   Married. 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    Wears seat belt. Exercises 3x or greater a week.    Former Smoker. Quit 1999, Occasional ETOH, no recreational drugs.    Drinks caffeine beverages. Uses herbal remedies. Takes a multivitamin.    Smoker alarm in the home.    Feels safe in his relationships.    Right Handed   Lives in one story home       Allergies as of 04/22/2020      Reactions   Statins Other (See Comments)   mylagia      Medication List       Accurate as of April 22, 2020  9:30 AM. If you  have any questions, ask your nurse or doctor.        STOP taking these medications   hydrocortisone 1 % ointment Stopped by: Howard Pouch, DO   metoprolol tartrate 100 MG tablet Commonly known as: LOPRESSOR Stopped by: Howard Pouch, DO   predniSONE 20 MG tablet Commonly known as: DELTASONE Stopped by: Howard Pouch, DO     TAKE these medications   APPLE CIDER VINEGAR PO Take by mouth.   ezetimibe 10 MG tablet Commonly known as: Zetia Take 1 tablet (10 mg total) by mouth daily.   FLAX SEED OIL PO Take 1,000 mg by mouth daily.   Multivitamin Adult Chew Chew by mouth.   MULTIVITAMIN ADULT EXTRA C PO Take by mouth.     triamcinolone 0.1 % Commonly known as: KENALOG Apply 1 application topically 2 (two) times daily.   Zinc 50 MG Tabs Take by mouth.       All past medical history, surgical history, allergies, family history, immunizations andmedications were updated in the EMR today and reviewed under the history and medication portions of their EMR.      ROS: 14 pt review of systems performed and negative (unless mentioned in an HPI)  Objective: BP 135/80   Pulse 66   Temp 97.6 F (36.4 C) (Oral)   Ht 6' 0.25" (1.835 m)   Wt 230 lb (104.3 kg)   SpO2 97%   BMI 30.98 kg/m  Gen: Afebrile. No acute distress. Nontoxic in appearance, well-developed, well-nourished,  Pleasant male.  HENT: AT. [place]. Bilateral TM visualized and normal in appearance, normal external auditory canal. MMM, no oral lesions, adequate dentition. Bilateral nares within normal limits. Throat without erythema, ulcerations or exudates. no Cough on exam, no hoarseness on exam. Eyes:Pupils Equal Round Reactive to light, Extraocular movements intact,  Conjunctiva without redness, discharge or icterus. Neck/lymp/endocrine: Supple,no lymphadenopathy, no thyromegaly CV: RRR no murmur, no edema, +2/4 P posterior tibialis pulses.  Chest: CTAB, no wheeze, rhonchi or crackles. normal Respiratory effort. good Air movement. Abd: Soft. flat. NTND. BS present. no Masses palpated. No hepatosplenomegaly. No rebound tenderness or guarding. Skin: no rashes, purpura or petechiae. Warm and well-perfused. Skin intact. Neuro/Msk:  Normal gait. PERLA. EOMi. Alert. Oriented x3.  Cranial nerves II through XII intact. Muscle strength 5/5 upper/lower extremity. DTRs equal bilaterally. Psych: Normal affect, dress and demeanor. Normal speech. Normal thought content and judgment.   No exam data present  Assessment/plan Russell Ortiz is a 49 y.o. male present for CPE Elevated hemoglobin A1c - Hemoglobin A1c Hyperlipidemia, unspecified hyperlipidemia  type/Overweight (BMI 25.0-29.9) - unable to tolerate  - Lipid panel - TSH Need for influenza vaccination Provided today  Prostate cancer screening - PSA  Routine general medical examination at a health care facility Patient was encouraged to exercise greater than 150 minutes a week. Patient was encouraged to choose a diet filled with fresh fruits and vegetables, and lean meats. AVS provided to patient today for education/recommendation on gender specific health and safety maintenance. Colonoscopy:[name] with a P-uncle colon cancer at 76. Completed 06/01/2018, Dr. Hilarie Fredrickson 5 yr follow up.  Immunizations: tdap UTD 04/2018, influenza provided today. covid series completed.  Infectious disease screening: HIV2016 PSA: no fhx. Psa normal last year. Collected today  Return in about 1 year (around 04/22/2021) for CPE (30 min).   Orders Placed This Encounter  Procedures  . Flu Vaccine QUAD 6+ mos PF IM (Fluarix Quad PF)  . Hemoglobin A1c  . Lipid panel  . TSH  .  PSA   No orders of the defined types were placed in this encounter.  Referral Orders  No referral(s) requested today     Electronically signed by: Howard Pouch, [place]

## 2020-04-22 NOTE — Telephone Encounter (Signed)
Please call patient Prostate cancer screening is normal Thyroid levels are normal Hemoglobin A1c/diabetes screen is increasing and now is 6.1.  This is in the prediabetic range.  No medications needed at this time however increasing exercise and cutting back on the sweets will help him prevent it from progressing to diabetes.  His cholesterol panel I am very concerned about- cholesterol is extremely high, which places him at risk for both heart attack and stroke.  Especially, when there is a family history of stroke in his brother recently. His total cholesterol was 294-this number needs to be closer to 200 or below. LDL/bad cholesterol was 211, this needs to be around 100. Increase the exercise to at least 150 minutes a week.  Cut back on sweets, red meats/fatty meats, butter, deep fried/fast foods and saturated fats.  Use olive oil or canola oil only, when using oils.  The only option for treatment that is not a statin(he is intolerant to statin and already on Zetia), is an injectable every 2 weeks called repatha.  It is an extremely tolerable subcu injection.  Once he picks up his medication-he should have a nurse appointment to instruct him on proper use/administration of injection.  Nurse visit for instruction once he picks up medication and provider appointment in 3 months fasting for recheck of levels.

## 2020-04-22 NOTE — Patient Instructions (Signed)

## 2020-04-23 ENCOUNTER — Other Ambulatory Visit: Payer: No Typology Code available for payment source

## 2020-04-23 DIAGNOSIS — Z20822 Contact with and (suspected) exposure to covid-19: Secondary | ICD-10-CM

## 2020-04-23 MED FILL — EZETIMIBE 10 MG TABS: 10 | 90 days supply | Qty: 90 | Fill #0

## 2020-04-23 NOTE — Telephone Encounter (Signed)
Spoke with pt regarding labs and instructions. Pt will cab to sched [place]

## 2020-04-24 LAB — SARS-COV-2, NAA 2 DAY TAT

## 2020-04-24 LAB — NOVEL CORONAVIRUS, NAA: SARS-CoV-2, NAA: NOT DETECTED

## 2020-04-27 ENCOUNTER — Telehealth: Payer: Self-pay

## 2020-04-27 DIAGNOSIS — Z789 Other specified health status: Secondary | ICD-10-CM

## 2020-04-27 DIAGNOSIS — E785 Hyperlipidemia, unspecified: Secondary | ICD-10-CM

## 2020-04-27 NOTE — Telephone Encounter (Signed)
PA started on Covermymeds. Key: [id number]

## 2020-04-28 ENCOUNTER — Ambulatory Visit: Payer: No Typology Code available for payment source

## 2020-04-28 ENCOUNTER — Other Ambulatory Visit: Payer: Self-pay

## 2020-04-28 DIAGNOSIS — Z789 Other specified health status: Secondary | ICD-10-CM

## 2020-04-28 DIAGNOSIS — Z7189 Other specified counseling: Secondary | ICD-10-CM

## 2020-04-29 NOTE — Telephone Encounter (Signed)
PA approved until 04/28/21 with 12 fills

## 2020-05-14 ENCOUNTER — Encounter: Payer: Self-pay | Admitting: Family Medicine

## 2020-05-14 ENCOUNTER — Telehealth (INDEPENDENT_AMBULATORY_CARE_PROVIDER_SITE_OTHER): Payer: No Typology Code available for payment source | Admitting: Family Medicine

## 2020-05-14 ENCOUNTER — Other Ambulatory Visit: Payer: No Typology Code available for payment source

## 2020-05-14 DIAGNOSIS — R059 Cough, unspecified: Secondary | ICD-10-CM | POA: Diagnosis not present

## 2020-05-14 DIAGNOSIS — R0981 Nasal congestion: Secondary | ICD-10-CM | POA: Diagnosis not present

## 2020-05-14 DIAGNOSIS — Z20822 Contact with and (suspected) exposure to covid-19: Secondary | ICD-10-CM

## 2020-05-14 MED ORDER — BENZONATATE 100 MG PO CAPS: 100.0000 mg | ORAL_CAPSULE | Freq: Three times a day (TID) | ORAL | 0 refills | Status: DC | PRN

## 2020-05-14 NOTE — Patient Instructions (Addendum)
   ---------------------------------------------------------------------------------------------------------------------------      WORK SLIP:  Patient Russell Ortiz,  [birth year], was seen for a medical visit today, 05/14/20 . Please excuse from work according to the [facility] [employer] guidelines for a COVID like illness. We advise 10 days minimum from the onset of symptoms (05/12/20) PLUS 1 day of no fever and improved symptoms. Will defer to employer for a sooner return to work if [place] testing is negative and the symptoms have resolved. Advise following CDC guidelines.    Sincerely: E-signature: Dr. Colin Benton, DO [place] Ph: [phone]   ------------------------------------------------------------------------------------------------------------------------------    HOME CARE TIPS:  -[place] testing information: https://www.[facility].org/ OR 270-007-7764 Most pharmacies also offer testing and home test kits.  -I sent the medication(s) we discussed to your pharmacy: Meds ordered this encounter  Medications  . benzonatate (TESSALON PERLES) 100 MG capsule    Sig: Take 1 capsule (100 mg total) by mouth 3 (three) times daily as needed.    Dispense:  20 capsule    Refill:  0    -can use tylenol or aleve if needed for fevers, aches and pains per instructions  -can use nasal saline a few times per day if nasal congestion, sometime a short course of Afrin nasal spray for 3 days can help as well  -stay hydrated, drink plenty of fluids and eat small healthy meals - avoid dairy  -can take 1000 IU Vit D3 and Vit C lozenges per instructions  -If the Covid test is positive, check out the CDC website for more information on home care, transmission and treatment for COVID19  -follow up with your doctor in 2-3 days unless improving and feeling better  -stay home while sick, except to seek medical care, and if you have COVID19  please stay home for a full 10 days since the onset of symptoms PLUS one day of no fever and feeling better.  It was nice to meet you today, and I really hope you are feeling better soon. I help [facility] out with telemedicine visits on Tuesdays and Thursdays and am available for visits on those days. If you have any concerns or questions following this visit please schedule a follow up visit with your Primary Care doctor or seek care at a local urgent care clinic to avoid delays in care.    Seek in person care promptly if your symptoms worsen, new concerns arise or you are not improving with treatment. Call 911 and/or seek emergency care if you symptoms are severe or life threatening.

## 2020-05-14 NOTE — Progress Notes (Signed)
Virtual Visit via Video Note  I connected with@  on 05/14/20 at  5:00 PM EST by a video enabled telemedicine application and verified that I am speaking with the correct person using two identifiers.  Location patient: home, [place] Location provider:work or home office Persons participating in the virtual visit: patient, provider  I discussed the limitations of evaluation and management by telemedicine and the availability of in person appointments. The patient expressed understanding and agreed to proceed.   HPI:  Acute telemedicine visit for sinus issues: -Onset: few days -Symptoms include: sinus congestion, croaky, cough, sinus headache -Denies:fevers, NVD, loss of taste or smell -daughter had similar symptoms last week -Has tried: tylenol, allergy pill, flonase -Pertinent past medical history:none per patient -Pertinent medication allergies:statins -COVID-19 vaccine status: fully vaccinated for COVID19 and just had booster; had flu shot as weel  ROS: See pertinent positives and negatives per HPI.  Past Medical History:  Diagnosis Date  . Genital warts   . Hyperlipidemia   . Mass of left thigh 04/06/2018  . PUD (peptic ulcer disease)     Past Surgical History:  Procedure Laterality Date  . VASECTOMY  2010     Current Outpatient Medications:  .  APPLE CIDER VINEGAR PO, Take by mouth., Disp: , Rfl:  .  Evolocumab (REPATHA SURECLICK) 604 MG/ML SOAJ, Inject 1 mL into the skin every 14 (fourteen) days., Disp: 2 mL, Rfl: 11 .  ezetimibe (ZETIA) 10 MG tablet, Take 1 tablet (10 mg total) by mouth daily., Disp: 90 tablet, Rfl: 1 .  Flaxseed, Linseed, (FLAX SEED OIL PO), Take 1,000 mg by mouth daily., Disp: , Rfl:  .  Multiple Vitamins-Minerals (MULTIVITAMIN ADULT EXTRA C PO), Take by mouth., Disp: , Rfl:  .  Multiple Vitamins-Minerals (MULTIVITAMIN ADULT) CHEW, Chew by mouth., Disp: , Rfl:  .  triamcinolone cream (KENALOG) 0.1 %, Apply 1 application topically 2 (two) times daily.,  Disp: 80 g, Rfl: 2 .  Zinc 50 MG TABS, Take by mouth., Disp: , Rfl:  .  benzonatate (TESSALON PERLES) 100 MG capsule, Take 1 capsule (100 mg total) by mouth 3 (three) times daily as needed., Disp: 20 capsule, Rfl: 0  EXAM:  VITALS per patient if applicable:  GENERAL: alert, oriented, appears well and in no acute distress  HEENT: atraumatic, conjunttiva clear, no obvious abnormalities on inspection of external nose and ears  NECK: normal movements of the head and neck  LUNGS: on inspection no signs of respiratory distress, breathing rate appears normal, no obvious gross SOB, gasping or wheezing  CV: no obvious cyanosis  MS: moves all visible extremities without noticeable abnormality  PSYCH/NEURO: pleasant and cooperative, no obvious depression or anxiety, speech and thought processing grossly intact  ASSESSMENT AND PLAN:  Discussed the following assessment and plan:  Sinus congestion  Cough  -we discussed possible serious and likely etiologies, options for evaluation and workup, limitations of telemedicine visit vs in person visit, treatment, treatment risks and precautions. Pt prefers to treat via telemedicine empirically rather than in person at this moment.  Query viral upper respiratory illness versus other.  Covid less likely given fully vaccinated and had his booster, though his booster was just in the last week.  Opted for symptomatic care with nasal saline, short course nasal decongestant and analgesics as needed.  Advised of Covid testing options, potential complications, precautions and staying home while sick. Work/School slipped offered: provided in patient instructions   Scheduled follow up with PCP offered: Agrees to follow-up as needed.  Advised to seek prompt in person care if worsening, new symptoms arise, or if is not improving with treatment. Discussed options for inperson care if PCP office not available. Did let this patient know that I only do telemedicine on  Tuesdays and Thursdays for [place]. Advised to schedule follow up visit with PCP or [facility] if any further questions or concerns to avoid delays in care.   I discussed the assessment and treatment plan with the patient. The patient was provided an opportunity to ask questions and all were answered. The patient agreed with the plan and demonstrated an understanding of the instructions.     Lucretia Kern, DO

## 2020-05-16 ENCOUNTER — Ambulatory Visit: Payer: Self-pay

## 2020-05-16 ENCOUNTER — Other Ambulatory Visit: Payer: Self-pay

## 2020-05-16 ENCOUNTER — Ambulatory Visit
Admission: EM | Admit: 2020-05-16 | Discharge: 2020-05-16 | Disposition: A | Discharge status: Home / Self Care | Payer: No Typology Code available for payment source | Attending: Internal Medicine | Admitting: Internal Medicine

## 2020-05-16 DIAGNOSIS — J209 Acute bronchitis, unspecified: Secondary | ICD-10-CM

## 2020-05-16 LAB — NOVEL CORONAVIRUS, NAA: SARS-CoV-2, NAA: NOT DETECTED

## 2020-05-16 LAB — SARS-COV-2, NAA 2 DAY TAT

## 2020-05-16 NOTE — Discharge Instructions (Signed)
Continue saline nasal spray use Take 200 mg of the Tessalon Perles at bedtime Tylenol as needed for pain If you notice blood clots in your sputum-please return to the urgent care to be evaluated.

## 2020-05-16 NOTE — ED Triage Notes (Signed)
Pt presents with cough, sore throat and fever since Tuesday, negative covid test

## 2020-05-16 NOTE — ED Triage Notes (Signed)
Pt presents with c/o sore throat, and cough for past few days

## 2020-05-16 NOTE — ED Provider Notes (Signed)
[facility]    CSN: [id number] Arrival date & time: 05/16/20  1217      History   Chief Complaint Chief Complaint  Patient presents with  . Cough    HPI Russell Ortiz is a 49 y.o. male with a history of cough, sore throat and fever comes to urgent care for evaluation.  Patient tested negative for Covid a few days ago.  His main concern today is about blood in his sputum.  He noticed some blood streaks in his sputum.  This followed a coughing spell.  No febrile episodes.  No difficulty breathing or wheezing.  Patient is taking his Tessalon Perles as prescribed.   HPI  Past Medical History:  Diagnosis Date  . Genital warts   . Hyperlipidemia   . Mass of left thigh 04/06/2018  . PUD (peptic ulcer disease)     Patient Active Problem List   Diagnosis Date Noted  . Cervical radiculopathy 04/20/2018  . Neutropenia ([place]) 04/12/2017  . Overweight (BMI 25.0-29.9) 11/28/2016  . Elevated hemoglobin A1c 08/25/2016  . Elevated liver function tests 03/12/2015  . Hyperlipemia 03/12/2015    Past Surgical History:  Procedure Laterality Date  . VASECTOMY  2010       Home Medications    Prior to Admission medications   Medication Sig Start Date End Date Taking? Authorizing Provider  APPLE CIDER VINEGAR PO Take by mouth.    [provider]  benzonatate (TESSALON PERLES) 100 MG capsule Take 1 capsule (100 mg total) by mouth 3 (three) times daily as needed. 05/14/20   Lucretia Kern, DO  Evolocumab (REPATHA SURECLICK) 211 MG/ML SOAJ Inject 1 mL into the skin every 14 (fourteen) days. 04/22/20   Kuneff, Renee A, DO  ezetimibe (ZETIA) 10 MG tablet Take 1 tablet (10 mg total) by mouth daily. 04/22/20   Kuneff, Renee A, DO  Flaxseed, Linseed, (FLAX SEED OIL PO) Take 1,000 mg by mouth daily.    [provider]  Multiple Vitamins-Minerals (MULTIVITAMIN ADULT EXTRA C PO) Take by mouth.    [provider]  Multiple Vitamins-Minerals (MULTIVITAMIN ADULT)  CHEW Chew by mouth.    [provider]  triamcinolone cream (KENALOG) 0.1 % Apply 1 application topically 2 (two) times daily. 11/12/19   Kuneff, Renee A, DO  Zinc 50 MG TABS Take by mouth.    [provider]    Family History Family History  Problem Relation Age of Onset  . Lung cancer Father   . Asthma Brother   . Stroke Brother   . Diabetes Maternal Grandfather   . Diabetes Maternal Uncle   . Hyperlipidemia Maternal Grandmother   . Colon cancer Paternal Uncle   . Prostate cancer Paternal Uncle   . Stomach cancer Neg Hx   . Esophageal cancer Neg Hx   . Colon polyps Neg Hx     Social History Social History   Tobacco Use  . Smoking status: Former Smoker    Quit date: 06/06/1997    Years since quitting: 22.9  . Smokeless tobacco: Never Used  Vaping Use  . Vaping Use: Never used  Substance Use Topics  . Alcohol use: Yes    Alcohol/week: 10.0 standard drinks    Types: 6 Cans of beer, 4 Shots of liquor per week    Comment: socially  . Drug use: No     Allergies   Statins   Review of Systems Review of Systems  HENT: Positive for sore throat.  Respiratory: Positive for cough.   Gastrointestinal: Negative for abdominal pain.  Musculoskeletal: Negative for arthralgias and myalgias.     Physical Exam Triage Vital Signs ED Triage Vitals  Enc Vitals Group     BP 05/16/20 1258 (!) 148/90     Pulse Rate 05/16/20 1258 75     Resp 05/16/20 1258 20     Temp 05/16/20 1258 99 F (37.2 C)     Temp src --      SpO2 05/16/20 1258 93 %     Weight --      Height --      Head Circumference --      Peak Flow --      Pain Score 05/16/20 1300 0     Pain Loc --      Pain Edu? --      Excl. in [place]? --    No data found.  Updated Vital Signs BP (!) 148/90   Pulse 75   Temp 99 F (37.2 C)   Resp 20   SpO2 93%   Visual Acuity Right Eye Distance:   Left Eye Distance:   Bilateral Distance:    Right Eye Near:   Left Eye Near:    Bilateral Near:      Physical Exam Vitals and nursing note reviewed.  Constitutional:      General: He is not in acute distress.    Appearance: He is not ill-appearing.  HENT:     Mouth/Throat:     Pharynx: No posterior oropharyngeal erythema.  Cardiovascular:     Rate and Rhythm: Normal rate and regular rhythm.     Pulses: Normal pulses.     Heart sounds: Normal heart sounds.  Pulmonary:     Effort: Pulmonary effort is normal.     Breath sounds: Normal breath sounds.  Neurological:     Mental Status: He is alert.      UC Treatments / Results  Labs (all labs ordered are listed, but only abnormal results are displayed) Labs Reviewed - No data to display  EKG   Radiology No results found.  Procedures Procedures (including critical care time)  Medications Ordered in UC Medications - No data to display  Initial Impression / Assessment and Plan / UC Course  I have reviewed the triage vital signs and the nursing notes.  Pertinent labs & imaging results that were available during my care of the patient were reviewed by me and considered in my medical decision making (see chart for details).     1.  Acute laryngotracheal bronchitis: Continue supportive care Continue saline nasal spray Increase Tessalon Perles dose to 200 mg at bedtime Increase oral fluid intake Return precautions given. Final Clinical Impressions(s) / UC Diagnoses   Final diagnoses:  Acute laryngotracheobronchitis     Discharge Instructions     Continue saline nasal spray use Take 200 mg of the Tessalon Perles at bedtime Tylenol as needed for pain If you notice blood clots in your sputum-please return to the urgent care to be evaluated.   ED Prescriptions    None     PDMP not reviewed this encounter.   Chase Picket, MD 05/16/20 [phone]

## 2020-05-18 ENCOUNTER — Telehealth: Payer: Self-pay

## 2020-05-18 NOTE — Telephone Encounter (Signed)
Please advise if VV or OV is okay

## 2020-05-18 NOTE — Telephone Encounter (Addendum)
Patient had VV on 12/9 with Dr. Colin Benton.  Patient was seen in Urgent Care office over weekend on 05/16/20. Patient is still coughing, to the point is unable to sleep at night.  Urgent Care told him to double up on Tessalon Perles. He is not getting relief at all.  Please advise. Patient can be reached at [phone].  I have an appt time "blocked" for patient tomorrow, if Dr. Raoul Pitch requiring another visit. Patient is aware of possible appt.

## 2020-05-19 ENCOUNTER — Telehealth (INDEPENDENT_AMBULATORY_CARE_PROVIDER_SITE_OTHER): Payer: No Typology Code available for payment source | Admitting: Family Medicine

## 2020-05-19 ENCOUNTER — Encounter: Payer: Self-pay | Admitting: Family Medicine

## 2020-05-19 DIAGNOSIS — J40 Bronchitis, not specified as acute or chronic: Secondary | ICD-10-CM | POA: Diagnosis not present

## 2020-05-19 DIAGNOSIS — R059 Cough, unspecified: Secondary | ICD-10-CM

## 2020-05-19 MED ORDER — PREDNISONE 50 MG PO TABS: 50.0000 mg | ORAL_TABLET | Freq: Every day | ORAL | 0 refills | Status: DC

## 2020-05-19 MED ORDER — BENZONATATE 200 MG PO CAPS: 200.0000 mg | ORAL_CAPSULE | Freq: Two times a day (BID) | ORAL | 0 refills | Status: DC

## 2020-05-19 MED ORDER — HYDROCODONE-HOMATROPINE 5-1.5 MG/5ML PO SYRP: 5.0000 mL | ORAL_SOLUTION | Freq: Three times a day (TID) | ORAL | 0 refills | Status: DC | PRN

## 2020-05-19 NOTE — Telephone Encounter (Signed)
Offer video visit. If available can be the 9 if still open

## 2020-05-19 NOTE — Progress Notes (Signed)
VIRTUAL VISIT VIA VIDEO  I connected with Russell Ortiz on 05/19/20 at  9:00 AM EST by elemedicine application and verified that I am speaking with the correct person using two identifiers. Location patient: Home Location provider: [facility], Office Persons participating in the virtual visit: Patient, Dr. Raoul Pitch and Samul Dada, CMA  I discussed the limitations of evaluation and management by telemedicine and the availability of in person appointments. The patient expressed understanding and agreed to proceed.   SUBJECTIVE Chief Complaint  Patient presents with  . Cough    Pt c/o hoarseness, productive coughing worsening at night with brownish tint, sore throat x 1 week; Pt had COVID test that was neg. Records located in chart    HPI: Russell Ortiz is a 49 y.o. male present for a cute concern of continue cough with bronchitis. He has been seen twice for this illness which started > 10 days ago by another provider and an UC. He was encouraged to continue supportive measures and provided with tessalon perles.  He states he has has used theraflu, tylenol cold and cough, mucinex DM and the tessalon perles and his night time cough is owrsening. The remainder of symptoms seem to be improving. He has noticed an occasional wheeze. He did have covid testing and it was negative.   ROS: See pertinent positives and negatives per HPI.  Patient Active Problem List   Diagnosis Date Noted  . Cervical radiculopathy 04/20/2018  . Neutropenia ([place]) 04/12/2017  . Overweight (BMI 25.0-29.9) 11/28/2016  . Elevated hemoglobin A1c 08/25/2016  . Elevated liver function tests 03/12/2015  . Hyperlipemia 03/12/2015    Social History   Tobacco Use  . Smoking status: Former Smoker    Quit date: 06/06/1997    Years since quitting: 22.9  . Smokeless tobacco: Never Used  Substance Use Topics  . Alcohol use: Yes    Alcohol/week: 10.0 standard drinks    Types: 6 Cans of beer, 4 Shots of liquor per week     Comment: socially    Current Outpatient Medications:  .  ezetimibe (ZETIA) 10 MG tablet, Take 1 tablet (10 mg total) by mouth daily., Disp: 90 tablet, Rfl: 1 .  Multiple Vitamins-Minerals (MULTIVITAMIN ADULT) CHEW, Chew by mouth., Disp: , Rfl:  .  triamcinolone cream (KENALOG) 0.1 %, Apply 1 application topically 2 (two) times daily., Disp: 80 g, Rfl: 2 .  benzonatate (TESSALON) 200 MG capsule, Take 1 capsule (200 mg total) by mouth 2 (two) times daily., Disp: 30 capsule, Rfl: 0 .  Evolocumab (REPATHA SURECLICK) 161 MG/ML SOAJ, Inject 1 mL into the skin every 14 (fourteen) days. (Patient not taking: Reported on 05/19/2020), Disp: 2 mL, Rfl: 11 .  HYDROcodone-homatropine (HYCODAN) 5-1.5 MG/5ML syrup, Take 5 mLs by mouth every 8 (eight) hours as needed for cough., Disp: 120 mL, Rfl: 0 .  predniSONE (DELTASONE) 50 MG tablet, Take 1 tablet (50 mg total) by mouth daily with breakfast., Disp: 3 tablet, Rfl: 0  Allergies  Allergen Reactions  . Statins Other (See Comments)    mylagia    OBJECTIVE: There were no vitals taken for this visit. Gen: No acute distress. Nontoxic in appearance.  HENT: AT. [place].  MMM.  Eyes:Pupils Equal Round Reactive to light, Extraocular movements intact,  Conjunctiva without redness, discharge or icterus. Chest: Cough not present Neuro:  Alert. Oriented x3  Psych: Normal affect and demeanor. Normal speech. Normal thought content and judgment.  ASSESSMENT AND PLAN: Russell Ortiz is  a 49 y.o. male present for  Cough/Bronchitis Rest, hydrate.  Delsym/ mucinex (DM if cough), nettie pot or nasal saline.  Tessalon perles and hycodan cough syrup prescribed.  Short burst of prednisone.  If cough present it can last up to 6-8 weeks.  F/U 2 weeks of not improved.    Howard Pouch, DO 05/19/2020   No follow-ups on file.  No orders of the defined types were placed in this encounter.  Meds ordered this encounter  Medications  . predniSONE (DELTASONE) 50 MG tablet     Sig: Take 1 tablet (50 mg total) by mouth daily with breakfast.    Dispense:  3 tablet    Refill:  0  . HYDROcodone-homatropine (HYCODAN) 5-1.5 MG/5ML syrup    Sig: Take 5 mLs by mouth every 8 (eight) hours as needed for cough.    Dispense:  120 mL    Refill:  0  . benzonatate (TESSALON) 200 MG capsule    Sig: Take 1 capsule (200 mg total) by mouth 2 (two) times daily.    Dispense:  30 capsule    Refill:  0   Referral Orders  No referral(s) requested today

## 2020-05-19 NOTE — Telephone Encounter (Signed)
T sched and ready

## 2020-05-19 NOTE — Telephone Encounter (Signed)
Routing to team pool

## 2020-05-27 ENCOUNTER — Other Ambulatory Visit: Payer: No Typology Code available for payment source

## 2020-05-27 DIAGNOSIS — Z20822 Contact with and (suspected) exposure to covid-19: Secondary | ICD-10-CM

## 2020-05-29 LAB — NOVEL CORONAVIRUS, NAA: SARS-CoV-2, NAA: NOT DETECTED

## 2020-05-29 LAB — SARS-COV-2, NAA 2 DAY TAT

## 2020-06-03 MED FILL — REPATHA SURECLICK 140 MG/ML: 140 | 28 days supply | Qty: 2 | Fill #0

## 2020-06-15 ENCOUNTER — Other Ambulatory Visit: Payer: Self-pay

## 2020-06-15 ENCOUNTER — Telehealth (INDEPENDENT_AMBULATORY_CARE_PROVIDER_SITE_OTHER): Payer: No Typology Code available for payment source | Admitting: Neurology

## 2020-06-15 DIAGNOSIS — M5412 Radiculopathy, cervical region: Secondary | ICD-10-CM

## 2020-06-15 NOTE — Progress Notes (Signed)
   Virtual Visit via Video Note The purpose of this virtual visit is to provide medical care while limiting exposure to the novel coronavirus.    Consent was obtained for video visit:  Yes.   Answered questions that patient had about telehealth interaction:  Yes.   I discussed the limitations, risks, security and privacy concerns of performing an evaluation and management service by telemedicine. I also discussed with the patient that there may be a patient responsible charge related to this service. The patient expressed understanding and agreed to proceed.  Pt location: Home Physician Location: office Name of referring provider:  Howard Pouch A, DO I connected with Russell Ortiz at patients initiation/request on 06/15/2020 at 10:50 AM EST by video enabled telemedicine application and verified that I am speaking with the correct person using two identifiers. Pt MRN:  [id number] Pt DOB:  [birth year] Video Participants:  [name]   History of Present Illness: This is a 50 y.o. male returning for follow-up of left hand tingling.  He was referred for neck physical therapy, but his insurance would not cover the visits, so he looked up exercises for neck stretching online and has been compliant with them.  His left hand tingling has resolved and he has not had any abnormal sensation in the past several month.  He denies weakness or neck pain.   Observations/Objective:   There were no vitals filed for this visit. Patient is awake, alert, and appears comfortable.  Oriented x 4.   Extraocular muscles are intact. No ptosis.  Face is symmetric.  Speech is not dysarthric.  Antigravity in the arms  Assessment and Plan:  Left hand paresthesias secondary to probable cervical radiculopathy, resolved.   - Pt encouraged to continue home neck stretching exercises   Follow Up Instructions:   I discussed the assessment and treatment plan with the patient. The patient was provided an opportunity to ask  questions and all were answered. The patient agreed with the plan and demonstrated an understanding of the instructions.   The patient was advised to call back or seek an in-person evaluation if the symptoms worsen or if the condition fails to improve as anticipated.  Return to clinic as needed   Alda Berthold, DO

## 2020-07-09 ENCOUNTER — Encounter: Payer: Self-pay | Admitting: *Deleted

## 2020-07-09 DIAGNOSIS — Z006 Encounter for examination for normal comparison and control in clinical research program: Secondary | ICD-10-CM

## 2020-07-09 NOTE — Research (Signed)
I called patient for 90-day phone call for CAD-Fem study. Patient is doing well with no further symptoms related to chest pain. I told patient I would call again in 1year. I thanked patient for helping [place] out with the study.

## 2020-07-10 MED FILL — REPATHA SURECLICK 140 MG/ML: 140 | 28 days supply | Qty: 2 | Fill #1

## 2020-08-06 MED FILL — REPATHA SURECLICK 140 MG/ML: 140 | 28 days supply | Qty: 2 | Fill #2

## 2020-09-12 ENCOUNTER — Other Ambulatory Visit (HOSPITAL_COMMUNITY): Payer: Self-pay

## 2020-09-12 MED FILL — Evolocumab Subcutaneous Soln Auto-Injector 140 MG/ML: SUBCUTANEOUS | 30 days supply | Qty: 2 | Fill #0 | Status: AC

## 2020-09-21 ENCOUNTER — Other Ambulatory Visit (HOSPITAL_COMMUNITY): Payer: Self-pay

## 2020-10-05 ENCOUNTER — Other Ambulatory Visit: Payer: Self-pay

## 2020-10-05 ENCOUNTER — Telehealth (INDEPENDENT_AMBULATORY_CARE_PROVIDER_SITE_OTHER): Payer: No Typology Code available for payment source | Admitting: Family Medicine

## 2020-10-05 ENCOUNTER — Encounter: Payer: Self-pay | Admitting: Family Medicine

## 2020-10-05 DIAGNOSIS — M791 Myalgia, unspecified site: Secondary | ICD-10-CM | POA: Diagnosis not present

## 2020-10-05 DIAGNOSIS — R0981 Nasal congestion: Secondary | ICD-10-CM

## 2020-10-05 DIAGNOSIS — B349 Viral infection, unspecified: Secondary | ICD-10-CM

## 2020-10-05 MED ORDER — GUAIFENESIN ER 600 MG PO TB12: 600.0000 mg | ORAL_TABLET | Freq: Two times a day (BID) | ORAL | 0 refills | Status: DC

## 2020-10-05 MED ORDER — FLUTICASONE PROPIONATE 50 MCG/ACT NA SUSP: 2.0000 | Freq: Every day | NASAL | 6 refills | Status: DC

## 2020-10-05 NOTE — Progress Notes (Signed)
VIRTUAL VISIT VIA VIDEO  I connected with Russell Ortiz on 10/05/20 at  4:00 PM EDT by elemedicine application and verified that I am speaking with the correct person using two identifiers. Location patient: Home Location provider: [facility], Office Persons participating in the virtual visit: Patient, Dr. Raoul Pitch and Darnell Level. Cesar, CMA  I discussed the limitations of evaluation and management by telemedicine and the availability of in person appointments. The patient expressed understanding and agreed to proceed.   SUBJECTIVE Chief Complaint  Patient presents with  . Generalized Body Aches    Pt c/o bodyaches, fatigue, nasal congestion, nasal dryness, low grade tempt, dizziness x 12 hours; pt completed home test COVID that resulted neg    HPI: Russell Ortiz is a 50 y.o. male present for acute illness of body aches, fatigue, nasal congestion & low grade temp. He also endorses dizziness.  Symptom onset: had symptoms when he got up today.  covid test: negative today- home test covid vaccine x3- booster completed.  H/o of covid illness: no Sick contacts: none [id number], saline water flushes. xyzal nightly.  Pt denies n,v, d, sore throat, headache, loss of taste or smell.  Endorses cough (very mild)  ROS: See pertinent positives and negatives per HPI.  Patient Active Problem List   Diagnosis Date Noted  . Cervical radiculopathy 04/20/2018  . Neutropenia ([place]) 04/12/2017  . Overweight (BMI 25.0-29.9) 11/28/2016  . Elevated hemoglobin A1c 08/25/2016  . Elevated liver function tests 03/12/2015  . Hyperlipemia 03/12/2015    Social History   Tobacco Use  . Smoking status: Former Smoker    Quit date: 06/06/1997    Years since quitting: 23.3  . Smokeless tobacco: Never Used  Substance Use Topics  . Alcohol use: Yes    Alcohol/week: 10.0 standard drinks    Types: 6 Cans of beer, 4 Shots of liquor per week    Comment: socially    Current Outpatient Medications:  .  Evolocumab  140 MG/ML SOAJ, INJECT 1 ML INTO THE SKIN EVERY 14 DAYS., Disp: 2 mL, Rfl: 11 .  fluticasone (FLONASE) 50 MCG/ACT nasal spray, Place 2 sprays into both nostrils daily., Disp: 16 g, Rfl: 6 .  guaiFENesin (MUCINEX) 600 MG 12 hr tablet, Take 1-2 tablets (600-1,200 mg total) by mouth 2 (two) times daily., Disp: 60 tablet, Rfl: 0 .  Multiple Vitamins-Minerals (MULTIVITAMIN ADULT) CHEW, Chew by mouth., Disp: , Rfl:  .  triamcinolone cream (KENALOG) 0.1 %, Apply 1 application topically 2 (two) times daily., Disp: 80 g, Rfl: 2  Allergies  Allergen Reactions  . Statins Other (See Comments)    mylagia    OBJECTIVE: There were no vitals taken for this visit. Gen: No acute distress. Nontoxic in appearance.  HENT: AT. [place].  MMM. Nasal congestion present Eyes:Pupils Equal Round Reactive to light, Extraocular movements intact,  Conjunctiva without redness, discharge or icterus. Chest: Cough - no cough on exam. No shortness of breath.  Skin: no rashes, purpura or petechiae.  Neuro:  Normal gait. Alert. Oriented x3  Psych: Normal affect and demeanor. Normal speech. Normal thought content and judgment.  ASSESSMENT AND PLAN: Russell Ortiz is a 50 y.o. male present for  Myalgia/[place]/viral illness Discussed with patient his symptoms are likely related to a viral illness. Abx would not be helpful at this time. Rest, hydrate.  Start  flonase, mucinex (DM if cough), and continue nasal saline.  Arranged testing: - Novel Coronavirus, NAA ([facility]); Future - POCT Influenza A/B; Future -  consider steroid taper or abx if symptoms last > 7-10 days without improvement. Pt will be called with test results and treated according if he tests positive.  F/u 1-2 weeks if symptoms are worsening.    Howard Pouch, DO 10/05/2020   Return in about 1 week (around 10/12/2020), or if symptoms worsen or fail to improve.  Orders Placed This Encounter  Procedures  . Novel Coronavirus, NAA ([facility])  . POCT Influenza A/B   Meds  ordered this encounter  Medications  . fluticasone (FLONASE) 50 MCG/ACT nasal spray    Sig: Place 2 sprays into both nostrils daily.    Dispense:  16 g    Refill:  6  . guaiFENesin (MUCINEX) 600 MG 12 hr tablet    Sig: Take 1-2 tablets (600-1,200 mg total) by mouth 2 (two) times daily.    Dispense:  60 tablet    Refill:  0   Referral Orders  No referral(s) requested today

## 2020-10-06 ENCOUNTER — Telehealth: Payer: Self-pay | Admitting: Family Medicine

## 2020-10-06 ENCOUNTER — Ambulatory Visit (INDEPENDENT_AMBULATORY_CARE_PROVIDER_SITE_OTHER): Payer: No Typology Code available for payment source

## 2020-10-06 DIAGNOSIS — R0981 Nasal congestion: Secondary | ICD-10-CM

## 2020-10-06 DIAGNOSIS — M791 Myalgia, unspecified site: Secondary | ICD-10-CM

## 2020-10-06 LAB — POCT INFLUENZA A/B
Influenza A, POC: NEGATIVE
Influenza B, POC: NEGATIVE

## 2020-10-06 NOTE — Telephone Encounter (Signed)
Please inform Santez his influenza test is negative. COVID PCR will take 24-48 hours to return.  Until that time rest, hydrate.  Drink electrolyte replacement such as Gatorade/G2. Continue over-the-counter supportive care with Tylenol/Advil and Xyzal nightly I had called in Flonase and Mucinex for him to start yesterday.  If COVID test is negative, and symptoms are not improving or worsening on Friday follow-up at that time and we can consider antibiotics then.  Currently this is considered a viral illness and an antibiotic would not be effective.

## 2020-10-06 NOTE — Telephone Encounter (Signed)
Spoke with pt regarding labs and instructions.   

## 2020-10-07 LAB — NOVEL CORONAVIRUS, NAA: SARS-CoV-2, NAA: NOT DETECTED

## 2020-10-07 LAB — SPECIMEN STATUS REPORT

## 2020-10-08 ENCOUNTER — Telehealth: Payer: Self-pay

## 2020-10-08 NOTE — Telephone Encounter (Signed)
Appt scheduled with PCP tomorrow for further evaluation  [facility] - Client TELEPHONE ADVICE RECORD [facility] Patient Name: Russell Ortiz Gender: Male DOB: 1971-03-30 Age: 50 Y 61 M 5 D Return Phone Number: [phone] (Primary) Address: City/ State/ Zip: [place] [address] Client [facility] - Client Client Site [place] - Day Physician Raoul Pitch, [place] Contact Type Call Who Is Calling Patient / Member / Family / Caregiver Call Type Triage / Clinical Relationship To Patient Self Return Phone Number [phone] (Primary) Chief Complaint CHEST PAIN - pain, pressure, heaviness or tightness Reason for Call Symptomatic / Request for [place] states, getting really bad headaches, also having chest pressure, tightness. Translation No Nurse Assessment Nurse: Rolin Barry, RN, Levada Dy Date/Time (Eastern Time): 10/08/2020 12:53:20 PM Confirm and document reason for call. If symptomatic, describe symptoms. ---Caller states, getting really bad headaches, also having chest pressure, tightness. Caller advised that he started having chest pressure this am, went away, a couple minutes. No headache at this time. Does the patient have any new or worsening symptoms? ---Yes Will a triage be completed? ---Yes Related visit to physician within the last 2 weeks? ---No Does the PT have any chronic conditions? (i.e. diabetes, asthma, this includes High risk factors for pregnancy, etc.) ---Yes List chronic conditions. ---High cholesterol Is this a behavioral health or substance abuse call? ---No Guidelines Guideline Title Affirmed Question Affirmed Notes Nurse Date/Time Eilene Ghazi Time) Chest Pain [1] Chest pain lasts < 5 minutes AND [2] NO chest pain or cardiac symptoms (e.g., breathing difficulty, sweating) now (Exception: chest pains that last only a few seconds) Deaton, RN, Levada Dy 10/08/2020  12:54:35 PM PLEASE NOTE: All timestamps contained within this report are represented as [place] Standard Time. CONFIDENTIALTY NOTICE: This fax transmission is intended only for the addressee. It contains information that is legally privileged, confidential or otherwise protected from use or disclosure. If you are not the intended recipient, you are strictly prohibited from reviewing, disclosing, copying using or disseminating any of this information or taking any action in reliance on or regarding this information. If you have received this fax in error, please notify us immediately by telephone so that we can arrange for its return to [place]. Phone: [phone], Toll-Free: [phone], Fax: [phone] Page: 2 of 2 Call Id: [id number] [place]. Time Eilene Ghazi Time) Disposition Final User 10/08/2020 12:52:03 PM Send to Urgent Laneta Simmers 10/08/2020 12:59:40 PM See PCP within 24 Hours Yes Deaton, RN, Cindee Lame Disagree/Comply Comply Caller Understands Yes PreDisposition Did not know what to do Care Advice Given Per Guideline SEE PCP WITHIN 24 HOURS: * IF OFFICE WILL BE OPEN: You need to be examined within the next 24 hours. Call your doctor (or NP/PA) when the office opens and make an appointment. CALL BACK IF: * Difficulty breathing occurs * Chest pain increases in frequency, duration or severity * Chest pain lasts over 5 minutes * You become worse CARE ADVICE given per Chest Pain (Adult) guideline. Comments User: Saverio Danker, RN Date/Time Eilene Ghazi Time): 10/08/2020 1:00:53 PM Caller denies any headache at this time. User: Saverio Danker, RN Date/Time Eilene Ghazi Time): 10/08/2020 1:01:13 PM No chest pain at this time. Referrals REFERRED TO PCP OFFICE

## 2020-10-09 ENCOUNTER — Other Ambulatory Visit: Payer: Self-pay

## 2020-10-09 ENCOUNTER — Telehealth (INDEPENDENT_AMBULATORY_CARE_PROVIDER_SITE_OTHER): Payer: No Typology Code available for payment source | Admitting: Family Medicine

## 2020-10-09 ENCOUNTER — Encounter: Payer: Self-pay | Admitting: Family Medicine

## 2020-10-09 DIAGNOSIS — B9689 Other specified bacterial agents as the cause of diseases classified elsewhere: Secondary | ICD-10-CM

## 2020-10-09 DIAGNOSIS — J329 Chronic sinusitis, unspecified: Secondary | ICD-10-CM

## 2020-10-09 MED ORDER — PREDNISONE 20 MG PO TABS: ORAL_TABLET | ORAL | 0 refills | Status: DC

## 2020-10-09 MED ORDER — DOXYCYCLINE HYCLATE 100 MG PO TABS: 100.0000 mg | ORAL_TABLET | Freq: Two times a day (BID) | ORAL | 0 refills | Status: DC

## 2020-10-09 NOTE — Progress Notes (Signed)
VIRTUAL VISIT VIA VIDEO  I connected with Russell Ortiz on 10/09/20 at  4:00 PM EDT by elemedicine application and verified that I am speaking with the correct person using two identifiers. Location patient: Home Location provider: [facility], Office Persons participating in the virtual visit: Patient, Dr. Raoul Pitch and Darnell Level. Cesar, CMA  I discussed the limitations of evaluation and management by telemedicine and the availability of in person appointments. The patient expressed understanding and agreed to proceed.   SUBJECTIVE Chief Complaint  Patient presents with  . Headache    Pt c/o HA x 1.5 days;     HPI: SOHAIL Ortiz is a 50 y.o. male present for acute illness of body aches, fatigue, nasal congestion & low grade temp 5 days ago. It symptoms now have progressed to headache, sinus congestion and more fatigue. Covid and influenza test have been negative. Symptom onset:> 5 days ago covid vaccine x3- booster completed.  H/o of covid illness: no Sick contacts: none [id number], saline water flushes. xyzal nightly. mucinex and flonase Pt denies n,v, d, sore throat, headache, loss of taste or smell.  Endorses cough (very mild)  ROS: See pertinent positives and negatives per HPI.  Patient Active Problem List   Diagnosis Date Noted  . Cervical radiculopathy 04/20/2018  . Neutropenia ([place]) 04/12/2017  . Overweight (BMI 25.0-29.9) 11/28/2016  . Elevated hemoglobin A1c 08/25/2016  . Elevated liver function tests 03/12/2015  . Hyperlipemia 03/12/2015    Social History   Tobacco Use  . Smoking status: Former Smoker    Quit date: 06/06/1997    Years since quitting: 23.3  . Smokeless tobacco: Never Used  Substance Use Topics  . Alcohol use: Yes    Alcohol/week: 10.0 standard drinks    Types: 6 Cans of beer, 4 Shots of liquor per week    Comment: socially    Current Outpatient Medications:  .  doxycycline (VIBRA-TABS) 100 MG tablet, Take 1 tablet (100 mg total) by mouth 2  (two) times daily., Disp: 20 tablet, Rfl: 0 .  Evolocumab 140 MG/ML SOAJ, INJECT 1 ML INTO THE SKIN EVERY 14 DAYS., Disp: 2 mL, Rfl: 11 .  fluticasone (FLONASE) 50 MCG/ACT nasal spray, Place 2 sprays into both nostrils daily., Disp: 16 g, Rfl: 6 .  guaiFENesin (MUCINEX) 600 MG 12 hr tablet, Take 1-2 tablets (600-1,200 mg total) by mouth 2 (two) times daily., Disp: 60 tablet, Rfl: 0 .  Multiple Vitamins-Minerals (MULTIVITAMIN ADULT) CHEW, Chew by mouth., Disp: , Rfl:  .  predniSONE (DELTASONE) 20 MG tablet, 60 mg x3d, 40 mg x3d, 20 mg x2d, 10 mg x2d, Disp: 18 tablet, Rfl: 0 .  triamcinolone cream (KENALOG) 0.1 %, Apply 1 application topically 2 (two) times daily., Disp: 80 g, Rfl: 2  Allergies  Allergen Reactions  . Statins Other (See Comments)    mylagia    OBJECTIVE: There were no vitals taken for this visit. Gen: Afebrile. No acute distress. Sounds more congested today HENT: AT. [place].MMM.  Eyes:Pupils Equal Round Reactive to light, Extraocular movements intact,  Conjunctiva without redness, discharge or icterus. CV: RRR Chest: CTAB, no wheeze or crackles Neuro: Normal gait. PERLA. EOMi. Alert. Oriented x3 Psych: Normal affect, dress and demeanor. Normal speech. Normal thought content and judgment.    ASSESSMENT AND PLAN: Russell Ortiz is a 51 y.o. male present for  Myalgia/[place]/viral illness> progressing to sinus infection - bacterial Rest, hydrate.  Continue  flonase, mucinex (DM if cough), and continue nasal saline.  -  Novel Coronavirus, NAA ([facility])> negative - POCT Influenza A/B> negative - start doxy bid and prednisone taper.  F/u 1-2 weeks if symptoms are worsening.    Howard Pouch, DO 10/09/2020   Return if symptoms worsen or fail to improve.  No orders of the defined types were placed in this encounter.  Meds ordered this encounter  Medications  . predniSONE (DELTASONE) 20 MG tablet    Sig: 60 mg x3d, 40 mg x3d, 20 mg x2d, 10 mg x2d    Dispense:  18 tablet     Refill:  0  . doxycycline (VIBRA-TABS) 100 MG tablet    Sig: Take 1 tablet (100 mg total) by mouth 2 (two) times daily.    Dispense:  20 tablet    Refill:  0   Referral Orders  No referral(s) requested today

## 2020-10-12 ENCOUNTER — Telehealth: Payer: Self-pay

## 2020-10-12 NOTE — Telephone Encounter (Signed)
Letter printed as requested

## 2020-10-12 NOTE — Telephone Encounter (Signed)
Patient requesting doctor's not for missing last week from work.  He forgot to ask on Friday during his virtual visit with Dr. Raoul Pitch.  Patient needs to be excused from work 5/2 - 5/9.  He returned to work today.  Patient will pick up today around 4pm.

## 2020-10-12 NOTE — Telephone Encounter (Signed)
Placed note in [place]

## 2020-10-27 ENCOUNTER — Other Ambulatory Visit (HOSPITAL_BASED_OUTPATIENT_CLINIC_OR_DEPARTMENT_OTHER): Payer: Self-pay

## 2020-10-27 MED FILL — Evolocumab Subcutaneous Soln Auto-Injector 140 MG/ML: SUBCUTANEOUS | 30 days supply | Qty: 2 | Fill #0 | Status: AC

## 2020-10-28 ENCOUNTER — Other Ambulatory Visit (HOSPITAL_BASED_OUTPATIENT_CLINIC_OR_DEPARTMENT_OTHER): Payer: Self-pay

## 2020-10-29 ENCOUNTER — Other Ambulatory Visit (HOSPITAL_BASED_OUTPATIENT_CLINIC_OR_DEPARTMENT_OTHER): Payer: Self-pay

## 2020-12-08 ENCOUNTER — Other Ambulatory Visit (HOSPITAL_BASED_OUTPATIENT_CLINIC_OR_DEPARTMENT_OTHER): Payer: Self-pay

## 2020-12-08 MED FILL — Evolocumab Subcutaneous Soln Auto-Injector 140 MG/ML: SUBCUTANEOUS | 90 days supply | Qty: 6 | Fill #1 | Status: AC

## 2021-01-04 ENCOUNTER — Other Ambulatory Visit: Payer: Self-pay

## 2021-01-04 ENCOUNTER — Telehealth (INDEPENDENT_AMBULATORY_CARE_PROVIDER_SITE_OTHER): Payer: No Typology Code available for payment source | Admitting: Family Medicine

## 2021-01-04 DIAGNOSIS — U071 COVID-19: Secondary | ICD-10-CM

## 2021-01-04 NOTE — Progress Notes (Signed)
[facility] [address] [place], [place] [address] [id number] [phone]  Date:  01/04/2021   Name:  Russell Ortiz   DOB:  [birth year]   MRN:  [id number]  PCP:  Ma Hillock, DO    Chief Complaint: No chief complaint on file.   History of Present Illness:  Russell Ortiz is a 50 y.o. very pleasant male patient who presents with the following:  Generally healthy man, primary pt of Dr Raoul Pitch  Phone visit today-we were not able to get video monitor to work.  Patient location is home, my location is office.  Patient identity, 2 factors, he gives consent for virtual visit today  Pt notes that he took at covid 19 test yesterday and it was positive  He is vaccinated against covid including booster Today is Monday He got sick this past Wednesday- he was having chills, fatigue, cough He is not sure about a fever  He did not lose his taste or smell  He is not checking vitals at home  He is using tylenol - using as needed   He notes that he is actually feeling better today, he denies any shortness of breath or other severe symptoms   Patient Active Problem List   Diagnosis Date Noted   Cervical radiculopathy 04/20/2018   Neutropenia ([place]) 04/12/2017   Overweight (BMI 25.0-29.9) 11/28/2016   Elevated hemoglobin A1c 08/25/2016   Elevated liver function tests 03/12/2015   Hyperlipemia 03/12/2015    Past Medical History:  Diagnosis Date   Genital warts    Hyperlipidemia    Mass of left thigh 04/06/2018   PUD (peptic ulcer disease)     Past Surgical History:  Procedure Laterality Date   VASECTOMY  2010    Social History   Tobacco Use   Smoking status: Former    Types: Cigarettes    Quit date: 06/06/1997    Years since quitting: 23.5   Smokeless tobacco: Never  Vaping Use   Vaping Use: Never used  Substance Use Topics   Alcohol use: Yes    Alcohol/week: 10.0 standard drinks    Types: 6 Cans of beer, 4 Shots of  liquor per week    Comment: socially   Drug use: No    Family History  Problem Relation Age of Onset   Lung cancer Father    Asthma Brother    Stroke Brother    Diabetes Maternal Grandfather    Diabetes Maternal Uncle    Hyperlipidemia Maternal Grandmother    Colon cancer Paternal Uncle    Prostate cancer Paternal Uncle    Stomach cancer Neg Hx    Esophageal cancer Neg Hx    Colon polyps Neg Hx     Allergies  Allergen Reactions   Statins Other (See Comments)    mylagia    Medication list has been reviewed and updated.  Current Outpatient Medications on File Prior to Visit  Medication Sig Dispense Refill   doxycycline (VIBRA-TABS) 100 MG tablet Take 1 tablet (100 mg total) by mouth 2 (two) times daily. 20 tablet 0   Evolocumab 140 MG/ML SOAJ INJECT 1 ML INTO THE SKIN EVERY 14 DAYS. 2 mL 11   fluticasone (FLONASE) 50 MCG/ACT nasal spray Place 2 sprays into both nostrils daily. 16 g 6   guaiFENesin (MUCINEX) 600 MG 12 hr tablet Take 1-2 tablets (600-1,200 mg total) by mouth 2 (two) times daily. 60 tablet 0  Multiple Vitamins-Minerals (MULTIVITAMIN ADULT) CHEW Chew by mouth.     predniSONE (DELTASONE) 20 MG tablet 60 mg x3d, 40 mg x3d, 20 mg x2d, 10 mg x2d 18 tablet 0   triamcinolone cream (KENALOG) 0.1 % Apply 1 application topically 2 (two) times daily. 80 g 2   [DISCONTINUED] metoprolol tartrate (LOPRESSOR) 100 MG tablet Take one tablet by mouth two hours prior to CT 1 tablet 0   No current facility-administered medications on file prior to visit.    Review of Systems:  As per HPI- otherwise negative.   Physical Examination: There were no vitals filed for this visit. There were no vitals filed for this visit. There is no height or weight on file to calculate BMI. Ideal Body Weight:    We were not able to get video to work today so we used phone only  Patient sounds well, no shortness of breath or distress is noted  Assessment and Plan: COVID-19  Phone only  visit today - COVID-19, symptoms started 6 days ago.  His symptoms are mild he is now out of the window for antivirals.  We discussed supportive measures, he will contact me if worsening.  I asked him to wear a mask to complete 10 days since symptom onset  Spoke with pt for 7 minute s Signed Lamar Blinks, MD

## 2021-03-09 ENCOUNTER — Other Ambulatory Visit (HOSPITAL_BASED_OUTPATIENT_CLINIC_OR_DEPARTMENT_OTHER): Payer: Self-pay

## 2021-03-09 MED FILL — Evolocumab Subcutaneous Soln Auto-Injector 140 MG/ML: SUBCUTANEOUS | 90 days supply | Qty: 6 | Fill #2 | Status: AC

## 2021-04-30 ENCOUNTER — Encounter: Payer: Self-pay | Admitting: Emergency Medicine

## 2021-04-30 ENCOUNTER — Other Ambulatory Visit: Payer: Self-pay

## 2021-04-30 ENCOUNTER — Ambulatory Visit
Admission: EM | Admit: 2021-04-30 | Discharge: 2021-04-30 | Disposition: A | Discharge status: Home / Self Care | Payer: No Typology Code available for payment source | Attending: Urgent Care | Admitting: Urgent Care

## 2021-04-30 DIAGNOSIS — B029 Zoster without complications: Secondary | ICD-10-CM | POA: Diagnosis not present

## 2021-04-30 MED ORDER — VALACYCLOVIR HCL 1 G PO TABS: 1000.0000 mg | ORAL_TABLET | Freq: Three times a day (TID) | ORAL | 0 refills | Status: DC

## 2021-04-30 MED ORDER — GABAPENTIN 300 MG PO CAPS: 300.0000 mg | ORAL_CAPSULE | Freq: Every day | ORAL | 0 refills | Status: DC

## 2021-04-30 MED ORDER — DOXYCYCLINE HYCLATE 100 MG PO CAPS: 100.0000 mg | ORAL_CAPSULE | Freq: Two times a day (BID) | ORAL | 0 refills | Status: DC

## 2021-04-30 NOTE — ED Triage Notes (Signed)
PT reports burning sensation on left scalp. Wednesday he noticed multiple sores on left scalp.

## 2021-04-30 NOTE — ED Provider Notes (Signed)
[place]   MRN: [id number] DOB: [birth year]  Subjective:   Russell Ortiz is a 50 y.o. male presenting for 3-day history of acute onset blisters over the left side of his scalp extending from the temporal area around his ear all the way to the back lower side of his scalp.  He was concerned because he felt like he had some drainage this morning.  Reports that it is a strong burning sensation that really hurts him on the scalp area.  No fever, bleeding, facial weakness, numbness or tingling.  No current facility-administered medications for this encounter.  Current Outpatient Medications:    Evolocumab 140 MG/ML SOAJ, INJECT 1 ML INTO THE SKIN EVERY 14 DAYS., Disp: 2 mL, Rfl: 11   fluticasone (FLONASE) 50 MCG/ACT nasal spray, Place 2 sprays into both nostrils daily., Disp: 16 g, Rfl: 6   Allergies  Allergen Reactions   Statins Other (See Comments)    mylagia    Past Medical History:  Diagnosis Date   Genital warts    Hyperlipidemia    Mass of left thigh 04/06/2018   PUD (peptic ulcer disease)      Past Surgical History:  Procedure Laterality Date   VASECTOMY  2010    Family History  Problem Relation Age of Onset   Lung cancer Father    Asthma Brother    Stroke Brother    Diabetes Maternal Grandfather    Diabetes Maternal Uncle    Hyperlipidemia Maternal Grandmother    Colon cancer Paternal Uncle    Prostate cancer Paternal Uncle    Stomach cancer Neg Hx    Esophageal cancer Neg Hx    Colon polyps Neg Hx     Social History   Tobacco Use   Smoking status: Former    Types: Cigarettes    Quit date: 06/06/1997    Years since quitting: 23.9   Smokeless tobacco: Never  Vaping Use   Vaping Use: Never used  Substance Use Topics   Alcohol use: Yes    Alcohol/week: 10.0 standard drinks    Types: 6 Cans of beer, 4 Shots of liquor per week    Comment: socially   Drug use: No    ROS   Objective:   Vitals: BP (!) 162/93   Pulse 61   Temp 97.8  F (36.6 C) (Oral)   Resp 16   SpO2 97%   Physical Exam Constitutional:      General: He is not in acute distress.    Appearance: Normal appearance. He is well-developed and normal weight. He is not ill-appearing, toxic-appearing or diaphoretic.  HENT:     Head: Normocephalic and atraumatic.      Right Ear: Tympanic membrane, ear canal and external ear normal. There is no impacted cerumen.     Left Ear: Tympanic membrane, ear canal and external ear normal. There is no impacted cerumen.     Nose: Nose normal.     Mouth/Throat:     Pharynx: Oropharynx is clear.  Eyes:     General: No scleral icterus.       Right eye: No discharge.        Left eye: No discharge.     Extraocular Movements: Extraocular movements intact.     Pupils: Pupils are equal, round, and reactive to light.  Cardiovascular:     Rate and Rhythm: Normal rate.  Pulmonary:     Effort: Pulmonary effort is normal.  Musculoskeletal:     Cervical back:  Normal range of motion.  Skin:    General: Skin is warm and dry.     Findings: Rash present.  Neurological:     Mental Status: He is alert and oriented to person, place, and time.     Cranial Nerves: No cranial nerve deficit.     Motor: No weakness.     Coordination: Coordination normal.     Gait: Gait normal.  Psychiatric:        Mood and Affect: Mood normal.        Behavior: Behavior normal.        Thought Content: Thought content normal.        Judgment: Judgment normal.    Assessment and Plan :   PDMP not reviewed this encounter.  1. Herpes zoster without complication    High suspicion for shingles given appearance of the rash.  Recommended starting valacyclovir.  Patient is concerned about bacterial infection and therefore offered him doxycycline if he has no improvement in the next 48 hours.  Use gabapentin for neuropathic burning type pain. Counseled patient on potential for adverse effects with medications prescribed/recommended today, ER and  return-to-clinic precautions discussed, patient verbalized understanding.    Jaynee Eagles, PA-C 04/30/21 1625

## 2021-05-04 ENCOUNTER — Other Ambulatory Visit: Payer: Self-pay

## 2021-05-04 ENCOUNTER — Other Ambulatory Visit (HOSPITAL_BASED_OUTPATIENT_CLINIC_OR_DEPARTMENT_OTHER): Payer: Self-pay

## 2021-05-04 ENCOUNTER — Encounter: Payer: Self-pay | Admitting: Family Medicine

## 2021-05-04 ENCOUNTER — Ambulatory Visit (INDEPENDENT_AMBULATORY_CARE_PROVIDER_SITE_OTHER): Payer: No Typology Code available for payment source | Admitting: Family Medicine

## 2021-05-04 VITALS — BP 146/89 | HR 68 | Temp 98.3°F | Ht 72.0 in | Wt 225.0 lb

## 2021-05-04 DIAGNOSIS — B029 Zoster without complications: Secondary | ICD-10-CM

## 2021-05-04 DIAGNOSIS — B0229 Other postherpetic nervous system involvement: Secondary | ICD-10-CM | POA: Insufficient documentation

## 2021-05-04 HISTORY — DX: Zoster without complications: B02.9

## 2021-05-04 MED ORDER — GABAPENTIN 300 MG PO CAPS
ORAL_CAPSULE | ORAL | 0 refills | Status: DC
  Filled 2021-05-04: qty 30, 10d supply, fill #0

## 2021-05-04 MED ORDER — PREDNISONE 20 MG PO TABS
40.0000 mg | ORAL_TABLET | Freq: Every day | ORAL | 0 refills | Status: DC
  Filled 2021-05-04: qty 10, 5d supply, fill #0

## 2021-05-04 MED ORDER — GABAPENTIN 300 MG PO CAPS
ORAL_CAPSULE | ORAL | 3 refills | Status: DC
  Filled 2021-05-04: qty 120, 30d supply, fill #0

## 2021-05-04 NOTE — Progress Notes (Signed)
This visit occurred during the SARS-CoV-2 public health emergency.  Safety protocols were in place, including screening questions prior to the visit, additional usage of staff PPE, and extensive cleaning of exam room while observing appropriate contact time as indicated for disinfecting solutions.    Russell Ortiz , 1970-09-30, 50 y.o., male MRN: [id number] Patient Care Team    Relationship Specialty Notifications Start End  Ma Hillock, DO PCP - General Family Medicine  08/17/15   Pyrtle, Lajuan Lines, MD Consulting Physician Gastroenterology  04/22/19   Alda Berthold, [place] Physician Neurology  03/13/20     Chief Complaint  Patient presents with   Herpes Zoster    Pt went to UC in [place] 11/25 and dx with shingles; pt reports rash on left side of head that burns x 7 days;      Subjective: Pt presents for an OV with complaints of rash that started 04/28/2021.  Rash is located on his scalp along lateral and posterior areas of scalp.  He states it burns.  He was seen at the urgent care 2 days ago after onset of rash and was diagnosed with shingles.  He states it did have little blisters but those have since cleared.  Unfortunately the neuralgia symptoms have remained.  He was prescribed Valtrex and gabapentin.  He also was prescribed doxycycline in the event of a superinfection which he was told not to start unless evidence of infection.  He states he never did start the doxycycline.  He denies visual changes, hearing loss, tinnitus, eye or ear pain.  He denies headache or neck ache.  Depression screen [facility] 2/9 05/04/2021 04/22/2020 04/22/2019 04/13/2018 09/14/2017  Decreased Interest 0 0 0 0 0  Down, Depressed, Hopeless 0 0 0 0 0  PHQ - 2 Score 0 0 0 0 0    Allergies  Allergen Reactions   Statins Other (See Comments)    mylagia   Social History   Social History Narrative   Married. 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     Wears seat belt. Exercises 3x or greater a week.    Former Smoker. Quit 1999, Occasional ETOH, no recreational drugs.    Drinks caffeine beverages. Uses herbal remedies. Takes a multivitamin.    Smoker alarm in the home.    Feels safe in his relationships.    Right Handed   Lives in one story home      Past Medical History:  Diagnosis Date   Genital warts    Hyperlipidemia    Mass of left thigh 04/06/2018   PUD (peptic ulcer disease)    Past Surgical History:  Procedure Laterality Date   VASECTOMY  2010   Family History  Problem Relation Age of Onset   Lung cancer Father    Asthma Brother    Stroke Brother    Diabetes Maternal Grandfather    Diabetes Maternal Uncle    Hyperlipidemia Maternal Grandmother    Colon cancer Paternal Uncle    Prostate cancer Paternal Uncle    Stomach cancer Neg Hx    Esophageal cancer Neg Hx    Colon polyps Neg Hx    Allergies as of 05/04/2021       Reactions   Statins Other (See Comments)   mylagia        Medication List        Accurate as of May 04, 2021  6:42 PM.  If you have any questions, ask your nurse or doctor.          STOP taking these medications    doxycycline 100 MG capsule Commonly known as: VIBRAMYCIN Stopped by: Howard Pouch, DO       TAKE these medications    fluticasone 50 MCG/ACT nasal spray Commonly known as: FLONASE Place 2 sprays into both nostrils daily.   gabapentin 300 MG capsule Commonly known as: NEURONTIN Take 1 capsule by mouth in the morning, take 1 capsule by mouth in the afternoon, and take 2 capsules by mouth before bedtime. (300 mg in morning and 300 mg afternoon and 600 mg before bed.) What changed:  how much to take how to take this when to take this additional instructions Changed by: Howard Pouch, DO   predniSONE 20 MG tablet Commonly known as: DELTASONE Take 2 tablets (40 mg total) by mouth daily with breakfast. Started by: Howard Pouch, DO   Repatha SureClick 732  MG/ML Soaj Generic drug: Evolocumab INJECT 1 ML INTO THE SKIN EVERY 14 DAYS.   valACYclovir 1000 MG tablet Commonly known as: VALTREX Take 1 tablet (1,000 mg total) by mouth 3 (three) times daily.        All past medical history, surgical history, allergies, family history, immunizations andmedications were updated in the EMR today and reviewed under the history and medication portions of their EMR.     ROS: Negative, with the exception of above mentioned in HPI   Objective:  BP (!) 146/89   Pulse 68   Temp 98.3 F (36.8 C) (Oral)   Ht 6' (1.829 m)   Wt 225 lb (102.1 kg)   SpO2 97%   BMI 30.52 kg/m  Body mass index is 30.52 kg/m. Gen: Afebrile. No acute distress. Nontoxic in appearance, well developed, well nourished.  HENT: AT. [place]. Bilateral TM visualized without erythema, bulging or vesicles.  EAC normal bilaterally.. MMM, no oral lesions. Bilateral nares without erythema, drainage or vesicles. Throat without erythema or exudates.  No cough.  No hoarseness. Eyes:Pupils Equal Round Reactive to light, Extraocular movements intact,  Conjunctiva without redness, discharge or icterus. Neck/lymp/endocrine: Supple, no lymphadenopathy Skin: No rashes, purpura or petechiae.  No obvious rash remains at this time.  Although exam is difficult to further evaluate for any hyperpigmentation remains secondary to hair. Neuro:  Normal gait. PERLA. EOMi. Alert. Oriented x3  Psych: Normal affect, dress and demeanor. Normal speech. Normal thought content and judgment.  No results found. No results found. No results found for this or any previous visit (from the past 24 hour(s)).  Assessment/Plan: Russell Ortiz is a 50 y.o. male present for OV for  Herpes zoster without complication/Postherpetic neuralgia Rash has cleared.  No vesicles or superinfection present today. He was encouraged to finish 10-day course of Valtrex. Increased gabapentin dose today to 300/300/600-caution on sedation  advised. Short burst of prednisone to help with possible neuralgia symptoms. Encouraged him to purchase over-the-counter lidocaine cream for topical application to help with burning sensation. Discuss tapering off gabapentin slowly in the next 2-4 weeks, returning to above dose if neuralgia still present. Follow-up emergently if any worsening headaches, fever, visual changes/eye pain, hearing changes/ear pain or neck pain occurs.  Reviewed expectations re: course of current medical issues. Discussed self-management of symptoms. Outlined signs and symptoms indicating need for more acute intervention. Patient verbalized understanding and all questions were answered. Patient received an After-Visit Summary.    No orders of the defined types were placed  in this encounter.  Meds ordered this encounter  Medications   DISCONTD: gabapentin (NEURONTIN) 300 MG capsule    Sig: 1 tab morning and afternoon, and 1 tabs before bed.    Dispense:  30 capsule    Refill:  0   predniSONE (DELTASONE) 20 MG tablet    Sig: Take 2 tablets (40 mg total) by mouth daily with breakfast.    Dispense:  10 tablet    Refill:  0   gabapentin (NEURONTIN) 300 MG capsule    Sig: 300 mg in morning and 300 mg afternoon and 600 mg before bed.    Dispense:  120 capsule    Refill:  3   Referral Orders  No referral(s) requested today     Note is dictated utilizing voice recognition software. Although note has been proof read prior to signing, occasional typographical errors still can be missed. If any questions arise, please do not hesitate to call for verification.   electronically signed by:  Howard Pouch, DO  [place]

## 2021-05-04 NOTE — Patient Instructions (Signed)
Finish valtrex. Start prednisone Increase gabapentin to 300/300/600 mg a day. After 14 days, start to cut back on gabapentin to night time dose only if able.   Buy lidocaine cream- this helps numb over the area.   Postherpetic Neuralgia Postherpetic neuralgia (PHN) is nerve pain that occurs after a shingles infection. Shingles is a painful rash that appears on one area of the body, usually on the trunk or face. Shingles is caused by the varicella-zoster virus. This is the same virus that causes chickenpox. In people who have had chickenpox, the virus can resurface years later and cause shingles. PHN appears in the same area where you had the shingles rash. The pain usually goes away after the rash disappears. You may have PHN if you continue to have pain 3 months after your shingles rash has gone away. What are the causes? This condition is caused by damage to your nerves due to inflammation from the varicella-zoster virus. The damage makes your nerves overly sensitive. What increases the risk? The following factors may make you more likely to develop this condition: Being older than 49 years of age. Having severe pain before your shingles rash starts. Having a severe rash. Having shingles in and around the eye area. Having a disease or taking medicine that causes you to have a weakened disease-fighting system (immune system). What are the signs or symptoms? The main symptom of this condition is pain. The pain may: Often be severe and may be described as stabbing, burning, shooting, or feeling like an electric shock. Come and go, or it may be there all the time. Be triggered by light touches on the skin or changes in temperature. You may have itching along with the pain. How is this diagnosed? This condition may be diagnosed based on your symptoms and your history of shingles. Lab studies and other diagnostic tests are usually not needed. How is this treated? There is no cure for this  condition. Treatment for PHN will focus on pain relief. Over-the-counter pain relievers do not usually relieve PHN pain. You may need to work with a pain specialist. Treatment may include: Anti-seizure medicines to relieve nerve pain. Antidepressant medicines to help with pain and improve sleep. A numbing patch worn on the skin (lidocaine patch). Strong pain relievers (opioids). Injections of numbing medicine or anti-inflammatory medicines around irritated nerves. Injections of botulinum toxin to block pain signals between nerves and muscles. Follow these instructions at home: Medicines Take over-the-counter and prescription medicines only as told by your health care provider. Ask your health care provider if the medicine prescribed to you: Requires you to avoid driving or using machinery. Can cause constipation. You may need to take these actions to prevent or treat constipation: Drink enough fluid to keep your urine pale yellow. Take over-the-counter or prescription medicines. Eat foods that are high in fiber, such as beans, whole grains, and fresh fruits and vegetables. Limit foods that are high in fat and processed sugars, such as fried or sweet foods. Managing pain  If directed, put ice on the painful area. To do this: Put ice in a plastic bag. Place a towel between your skin and the bag. Leave the ice on for 20 minutes, 2-3 times a day. Remove the ice if your skin turns bright red. This is very important. If you cannot feel pain, heat, or cold, you have a greater risk of damage to the area. Cover sensitive areas with a bandage (dressing) to reduce friction from clothing rubbing on the  area. General instructions It may take a long time to recover from PHN. Work closely with your health care provider and develop a good support system at home. You may consider joining a support group. Wear loose, comfortable clothing. Talk to your health care provider if you feel depressed or  desperate. Living with long-term pain can be depressing. Keep all follow-up visits. This is important. How is this prevented? Getting a vaccination for shingles can prevent PHN. The shingles vaccine is recommended for people older than age 37. It may prevent shingles and may also lower your risk of PHN if you do get shingles. Contact a health care provider if: Your medicine is not helping. You are struggling to manage your pain at home. Get help right away if: You have thoughts about hurting yourself or others. If you ever feel like you may hurt yourself or others, or have thoughts about taking your own life, get help right away. Go to your nearest emergency department or: Call your local emergency services (911 in the [place] Call a suicide crisis helpline, such as the [facility] at 2674249233 or 988 in the [place]. This is open 24 hours a day in the [place] Text the Crisis Text Line at 630-793-5571 (in the [place].). Summary Postherpetic neuralgia (PHN) is a very painful disorder that can occur after an episode of shingles. The pain is often severe and may be described as stabbing, burning, shooting, or feeling like an electric shock. Prescription medicines can be helpful in managing persistent pain. Getting a vaccination for shingles can prevent PHN. This vaccine is recommended for people older than age 3. This information is not intended to replace advice given to you by your health care provider. Make sure you discuss any questions you have with your health care provider. Document Revised: 12/16/2020 Document Reviewed: 05/18/2020 Elsevier Patient Education  2022 [employer].

## 2021-05-13 ENCOUNTER — Encounter: Payer: Self-pay | Admitting: Family Medicine

## 2021-05-14 ENCOUNTER — Telehealth: Payer: Self-pay | Admitting: Family Medicine

## 2021-05-14 NOTE — Telephone Encounter (Signed)
See other encounter.

## 2021-05-14 NOTE — Telephone Encounter (Signed)
Please advise 

## 2021-05-14 NOTE — Telephone Encounter (Signed)
[place], Russell Ortiz  [employer] 26 minutes ago (2:15 PM)   YM Russell Ortiz is coming in on Monday. He was seen for shingles and treated. He thinks it is back. He stated that he scratched his head and felt wetness. He did not finish his antibiotic so he started taking it again. He had a trip planned to see the game and cannot go, he can get his money back if he has a letter from the doctor.

## 2021-05-14 NOTE — Telephone Encounter (Signed)
Ok to provide for him. Please draft.

## 2021-05-17 ENCOUNTER — Ambulatory Visit (INDEPENDENT_AMBULATORY_CARE_PROVIDER_SITE_OTHER): Payer: No Typology Code available for payment source | Admitting: Family Medicine

## 2021-05-17 ENCOUNTER — Encounter: Payer: Self-pay | Admitting: Family Medicine

## 2021-05-17 ENCOUNTER — Other Ambulatory Visit: Payer: Self-pay

## 2021-05-17 ENCOUNTER — Other Ambulatory Visit (HOSPITAL_BASED_OUTPATIENT_CLINIC_OR_DEPARTMENT_OTHER): Payer: Self-pay

## 2021-05-17 VITALS — BP 137/80 | HR 70 | Temp 98.1°F | Ht 74.0 in | Wt 219.0 lb

## 2021-05-17 DIAGNOSIS — B0229 Other postherpetic nervous system involvement: Secondary | ICD-10-CM | POA: Diagnosis not present

## 2021-05-17 DIAGNOSIS — Z23 Encounter for immunization: Secondary | ICD-10-CM | POA: Diagnosis not present

## 2021-05-17 DIAGNOSIS — D709 Neutropenia, unspecified: Secondary | ICD-10-CM

## 2021-05-17 DIAGNOSIS — E782 Mixed hyperlipidemia: Secondary | ICD-10-CM | POA: Diagnosis not present

## 2021-05-17 DIAGNOSIS — Z0000 Encounter for general adult medical examination without abnormal findings: Secondary | ICD-10-CM | POA: Diagnosis not present

## 2021-05-17 DIAGNOSIS — E663 Overweight: Secondary | ICD-10-CM | POA: Diagnosis not present

## 2021-05-17 DIAGNOSIS — Z125 Encounter for screening for malignant neoplasm of prostate: Secondary | ICD-10-CM | POA: Diagnosis not present

## 2021-05-17 DIAGNOSIS — R7309 Other abnormal glucose: Secondary | ICD-10-CM | POA: Diagnosis not present

## 2021-05-17 DIAGNOSIS — B029 Zoster without complications: Secondary | ICD-10-CM | POA: Diagnosis not present

## 2021-05-17 LAB — LIPID PANEL
Cholesterol: 168 mg/dL (ref 0–200)
HDL: 62 mg/dL (ref >39.00)
LDL Cholesterol: 81 mg/dL (ref 0–99)
NonHDL: 105.81
Total CHOL/HDL Ratio: 3
Triglycerides: 124 mg/dL (ref 0.0–149.0)
VLDL: 24.8 mg/dL (ref 0.0–40.0)

## 2021-05-17 LAB — COMPREHENSIVE METABOLIC PANEL
ALT: 36 U/L (ref 0–53)
AST: 46 U/L — ABNORMAL HIGH (ref 0–37)
Albumin: 4.6 g/dL (ref 3.5–5.2)
Alkaline Phosphatase: 46 U/L (ref 39–117)
BUN: 12 mg/dL (ref 6–23)
CO2: 22 mEq/L (ref 19–32)
Calcium: 9.7 mg/dL (ref 8.4–10.5)
Chloride: 107 mEq/L (ref 96–112)
Creatinine, Ser: 1.12 mg/dL (ref 0.40–1.50)
GFR: 77.35 mL/min (ref >60.00)
Glucose, Bld: 86 mg/dL (ref 70–99)
Potassium: 4.2 mEq/L (ref 3.5–5.1)
Sodium: 139 mEq/L (ref 135–145)
Total Bilirubin: 0.9 mg/dL (ref 0.2–1.2)
Total Protein: 7.1 g/dL (ref 6.0–8.3)

## 2021-05-17 LAB — CBC WITH DIFFERENTIAL/PLATELET
Basophils Absolute: 0 10*3/uL (ref 0.0–0.1)
Basophils Relative: 0.5 % (ref 0.0–3.0)
Eosinophils Absolute: 0.2 10*3/uL (ref 0.0–0.7)
Eosinophils Relative: 5.2 % — ABNORMAL HIGH (ref 0.0–5.0)
HCT: 46.5 % (ref 39.0–52.0)
Hemoglobin: 14.9 g/dL (ref 13.0–17.0)
Lymphocytes Relative: 47.2 % — ABNORMAL HIGH (ref 12.0–46.0)
Lymphs Abs: 1.4 10*3/uL (ref 0.7–4.0)
MCHC: 32.1 g/dL (ref 30.0–36.0)
MCV: 87 fl (ref 78.0–100.0)
Monocytes Absolute: 0.3 10*3/uL (ref 0.1–1.0)
Monocytes Relative: 10.5 % (ref 3.0–12.0)
Neutro Abs: 1.1 10*3/uL — ABNORMAL LOW (ref 1.4–7.7)
Neutrophils Relative %: 36.6 % — ABNORMAL LOW (ref 43.0–77.0)
Platelets: 176 10*3/uL (ref 150.0–400.0)
RBC: 5.34 Mil/uL (ref 4.22–5.81)
RDW: 14.7 % (ref 11.5–15.5)
WBC: 3 10*3/uL — ABNORMAL LOW (ref 4.0–10.5)

## 2021-05-17 LAB — TSH: TSH: 3.15 u[IU]/mL (ref 0.35–5.50)

## 2021-05-17 LAB — HEMOGLOBIN A1C: Hgb A1c MFr Bld: 6 % (ref 4.6–6.5)

## 2021-05-17 LAB — PSA: PSA: 0.25 ng/mL (ref 0.10–4.00)

## 2021-05-17 MED ORDER — EVOLOCUMAB 140 MG/ML ~~LOC~~ SOAJ
SUBCUTANEOUS | 11 refills | Status: DC
  Filled 2021-05-17: qty 6, 84d supply, fill #0
  Filled 2021-05-20 – 2021-06-04 (×2): qty 2, 28d supply, fill #0
  Filled 2021-07-30: qty 2, 28d supply, fill #1
  Filled 2021-08-26: qty 2, 28d supply, fill #2
  Filled 2022-01-20: qty 2, 28d supply, fill #3
  Filled 2022-03-21: qty 2, 28d supply, fill #4

## 2021-05-17 MED ORDER — FLUTICASONE PROPIONATE 50 MCG/ACT NA SUSP
2.0000 | Freq: Every day | NASAL | 6 refills | Status: DC
  Filled 2021-05-17 – 2021-06-04 (×2): qty 16, 30d supply, fill #0
  Filled 2021-08-26: qty 16, 30d supply, fill #1
  Filled 2021-10-25: qty 16, 30d supply, fill #2
  Filled 2022-01-20: qty 16, 30d supply, fill #3
  Filled 2022-03-21: qty 16, 30d supply, fill #4

## 2021-05-17 NOTE — Patient Instructions (Signed)
Great to see you today.  I have refilled the medication(s) we provide.   If labs were collected, we will inform you of lab results once received either by echart message or telephone call.   - echart message- for normal results that have been seen by the patient already.   - telephone call: abnormal results or if patient has not viewed results in their echart. Health Maintenance, Male Adopting a healthy lifestyle and getting preventive care are important in promoting health and wellness. Ask your health care provider about: The right schedule for you to have regular tests and exams. Things you can do on your own to prevent diseases and keep yourself healthy. What should I know about diet, weight, and exercise? Eat a healthy diet  Eat a diet that includes plenty of vegetables, fruits, low-fat dairy products, and lean protein. Do not eat a lot of foods that are high in solid fats, added sugars, or sodium. Maintain a healthy weight Body mass index (BMI) is a measurement that can be used to identify possible weight problems. It estimates body fat based on height and weight. Your health care provider can help determine your BMI and help you achieve or maintain a healthy weight. Get regular exercise Get regular exercise. This is one of the most important things you can do for your health. Most adults should: Exercise for at least 150 minutes each week. The exercise should increase your heart rate and make you sweat (moderate-intensity exercise). Do strengthening exercises at least twice a week. This is in addition to the moderate-intensity exercise. Spend less time sitting. Even light physical activity can be beneficial. Watch cholesterol and blood lipids Have your blood tested for lipids and cholesterol at 49 years of age, then have this test every 5 years. You may need to have your cholesterol levels checked more often if: Your lipid or cholesterol levels are high. You are older than 49 years  of age. You are at high risk for heart disease. What should I know about cancer screening? Many types of cancers can be detected early and may often be prevented. Depending on your health history and family history, you may need to have cancer screening at various ages. This may include screening for: Colorectal cancer. Prostate cancer. Skin cancer. Lung cancer. What should I know about heart disease, diabetes, and high blood pressure? Blood pressure and heart disease High blood pressure causes heart disease and increases the risk of stroke. This is more likely to develop in people who have high blood pressure readings or are overweight. Talk with your health care provider about your target blood pressure readings. Have your blood pressure checked: Every 3-5 years if you are 44-10 years of age. Every year if you are 46 years old or older. If you are between the ages of 65 and 65 and are a current or former smoker, ask your health care provider if you should have a one-time screening for abdominal aortic aneurysm (AAA). Diabetes Have regular diabetes screenings. This checks your fasting blood sugar level. Have the screening done: Once every three years after age 77 if you are at a normal weight and have a low risk for diabetes. More often and at a younger age if you are overweight or have a high risk for diabetes. What should I know about preventing infection? Hepatitis B If you have a higher risk for hepatitis B, you should be screened for this virus. Talk with your health care provider to find out if  you are at risk for hepatitis B infection. Hepatitis C Blood testing is recommended for: Everyone born from [birth year] through 1964. Anyone with known risk factors for hepatitis C. Sexually transmitted infections (STIs) You should be screened each year for STIs, including gonorrhea and chlamydia, if: You are sexually active and are younger than 49 years of age. You are older than 49 years of age  and your health care provider tells you that you are at risk for this type of infection. Your sexual activity has changed since you were last screened, and you are at increased risk for chlamydia or gonorrhea. Ask your health care provider if you are at risk. Ask your health care provider about whether you are at high risk for HIV. Your health care provider may recommend a prescription medicine to help prevent HIV infection. If you choose to take medicine to prevent HIV, you should first get tested for HIV. You should then be tested every 3 months for as long as you are taking the medicine. Follow these instructions at home: Alcohol use Do not drink alcohol if your health care provider tells you not to drink. If you drink alcohol: Limit how much you have to 0-2 drinks a day. Know how much alcohol is in your drink. In the [place] one drink equals one 12 oz bottle of beer (355 mL), one 5 oz glass of wine (148 mL), or one 1 oz glass of hard liquor (44 mL). Lifestyle Do not use any products that contain nicotine or tobacco. These products include cigarettes, chewing tobacco, and vaping devices, such as e-cigarettes. If you need help quitting, ask your health care provider. Do not use street drugs. Do not share needles. Ask your health care provider for help if you need support or information about quitting drugs. General instructions Schedule regular health, dental, and eye exams. Stay current with your vaccines. Tell your health care provider if: You often feel depressed. You have ever been abused or do not feel safe at home. Summary Adopting a healthy lifestyle and getting preventive care are important in promoting health and wellness. Follow your health care provider's instructions about healthy diet, exercising, and getting tested or screened for diseases. Follow your health care provider's instructions on monitoring your cholesterol and blood pressure. This information is not intended to  replace advice given to you by your health care provider. Make sure you discuss any questions you have with your health care provider. Document Revised: 10/12/2020 Document Reviewed: 10/12/2020 Elsevier Patient Education  [place].

## 2021-05-17 NOTE — Progress Notes (Signed)
This visit occurred during the SARS-CoV-2 public health emergency.  Safety protocols were in place, including screening questions prior to the visit, additional usage of staff PPE, and extensive cleaning of exam room while observing appropriate contact time as indicated for disinfecting solutions.    Patient ID: Russell Ortiz, male  DOB: 1971/01/12, 50 y.o.   MRN: [id number] Patient Care Team    Relationship Specialty Notifications Start End  Ma Hillock, DO PCP - General Family Medicine  08/17/15   Pyrtle, Lajuan Lines, MD Consulting Physician Gastroenterology  04/22/19   Alda Berthold, [place] Physician Neurology  03/13/20     Chief Complaint  Patient presents with   Annual Exam    Pt is fasting    Subjective: Russell Ortiz is a 50 y.o. male present for CPE. All past medical history, surgical history, allergies, family history, immunizations, medications and social history were updated in the electronic medical record today. All recent labs, ED visits and hospitalizations within the last year were reviewed.  Health maintenance:  Colonoscopy: [name] with a P-uncle colon cancer at 22. Completed 06/01/2018, Dr. Hilarie Fredrickson 5 yr follow up. Due 2024. Immunizations:  tdap UTD 04/2018, influenza - updated today . covid series completed.  Infectious disease screening: HIV and hep c screen completed PSA: no fhx. Collected today PSA:  Lab Results  Component Value Date   PSA 0.20 04/22/2020   PSA 0.24 04/22/2019   PSA 0.30 04/13/2018  , pt was counseled on prostate cancer screenings.  Assistive device: none Oxygen VOH:YWVP* Patient has a Dental home. Hospitalizations/ED visits: reviewed  Depression screen [facility] 2/9 05/17/2021 05/04/2021 04/22/2020 04/22/2019 04/13/2018  Decreased Interest 0 0 0 0 0  Down, Depressed, Hopeless 0 0 0 0 0  PHQ - 2 Score 0 0 0 0 0   No flowsheet data found.      Fall Risk  06/15/2020 03/13/2020 09/14/2017 03/17/2016  Falls in the past year? 0 0 No No  Number  falls in past yr: 0 0 - -  Injury with Fall? 0 0 - -    Immunization History  Administered Date(s) Administered   Influenza Split 03/29/2012   Influenza,inj,Quad PF,6+ Mos 03/17/2016, 04/13/2018, 04/22/2019, 04/22/2020   PFIZER(Purple Top)SARS-COV-2 Vaccination 09/07/2019, 10/02/2019, 05/06/2020   Td 08/15/2007   Tdap 04/13/2018   Past Medical History:  Diagnosis Date   Genital warts    Hyperlipidemia    Mass of left thigh 04/06/2018   PUD (peptic ulcer disease)    Allergies  Allergen Reactions   Statins Other (See Comments)    mylagia   Past Surgical History:  Procedure Laterality Date   VASECTOMY  2010   Family History  Problem Relation Age of Onset   Lung cancer Father    Asthma Brother    Stroke Brother    Diabetes Maternal Grandfather    Diabetes Maternal Uncle    Hyperlipidemia Maternal Grandmother    Colon cancer Paternal Uncle    Prostate cancer Paternal Uncle    Stomach cancer Neg Hx    Esophageal cancer Neg Hx    Colon polyps Neg Hx    Social History   Social History Narrative   Married. 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    Wears seat belt. Exercises 3x or greater a week.    Former Smoker. Quit 1999, Occasional ETOH, no recreational drugs.    Drinks caffeine beverages. Uses herbal remedies. Takes a multivitamin.  Smoker alarm in the home.    Feels safe in his relationships.    Right Handed   Lives in one story home       Allergies as of 05/17/2021       Reactions   Statins Other (See Comments)   mylagia        Medication List        Accurate as of May 17, 2021 10:23 AM. If you have any questions, ask your nurse or doctor.          STOP taking these medications    gabapentin 300 MG capsule Commonly known as: NEURONTIN Stopped by: Howard Pouch, DO   predniSONE 20 MG tablet Commonly known as: DELTASONE Stopped by: Howard Pouch, DO   valACYclovir 1000 MG tablet Commonly  known as: VALTREX Stopped by: Howard Pouch, DO       TAKE these medications    Evolocumab 140 MG/ML Soaj INJECT 1 ML INTO THE SKIN EVERY 14 DAYS.   fluticasone 50 MCG/ACT nasal spray Commonly known as: FLONASE Place 2 sprays into both nostrils daily.       All past medical history, surgical history, allergies, family history, immunizations andmedications were updated in the EMR today and reviewed under the history and medication portions of their EMR.     No results found for this or any previous visit (from the past 2160 hour(s)).  No results found.  ROS 14 pt review of systems performed and negative (unless mentioned in an HPI)  Objective:  BP 137/80   Pulse 70   Temp 98.1 F (36.7 C) (Oral)   Ht 6\' 2"  (1.88 m)   Wt 219 lb (99.3 kg)   SpO2 98%   BMI 28.12 kg/m   Physical Exam Constitutional:      General: He is not in acute distress.    Appearance: Normal appearance. He is not ill-appearing, toxic-appearing or diaphoretic.  HENT:     Head: Normocephalic and atraumatic.     Right Ear: Tympanic membrane, ear canal and external ear normal. There is no impacted cerumen.     Left Ear: Tympanic membrane, ear canal and external ear normal. There is no impacted cerumen.     Nose: Nose normal. No congestion or rhinorrhea.     Mouth/Throat:     Mouth: Mucous membranes are moist.     Pharynx: Oropharynx is clear. No oropharyngeal exudate or posterior oropharyngeal erythema.  Eyes:     General: No scleral icterus.       Right eye: No discharge.        Left eye: No discharge.     Extraocular Movements: Extraocular movements intact.     Pupils: Pupils are equal, round, and reactive to light.  Cardiovascular:     Rate and Rhythm: Normal rate and regular rhythm.     Pulses: Normal pulses.     Heart sounds: Normal heart sounds. No murmur heard.   No friction rub. No gallop.  Pulmonary:     Effort: Pulmonary effort is normal. No respiratory distress.     Breath sounds:  Normal breath sounds. No stridor. No wheezing, rhonchi or rales.  Chest:     Chest wall: No tenderness.  Abdominal:     General: Abdomen is flat. Bowel sounds are normal. There is no distension.     Palpations: Abdomen is soft. There is no mass.     Tenderness: There is no abdominal tenderness. There is no right CVA tenderness, left CVA tenderness,  guarding or rebound.     Hernia: No hernia is present.  Musculoskeletal:        General: No swelling or tenderness. Normal range of motion.     Cervical back: Normal range of motion and neck supple.     Right lower leg: No edema.     Left lower leg: No edema.  Lymphadenopathy:     Cervical: No cervical adenopathy.  Skin:    General: Skin is warm and dry.     Coloration: Skin is not jaundiced.     Findings: No bruising, lesion or rash.  Neurological:     General: No focal deficit present.     Mental Status: He is alert and oriented to person, place, and time. Mental status is at baseline.     Cranial Nerves: No cranial nerve deficit.     Sensory: No sensory deficit.     Motor: No weakness.     Coordination: Coordination normal.     Gait: Gait normal.     Deep Tendon Reflexes: Reflexes normal.  Psychiatric:        Mood and Affect: Mood normal.        Behavior: Behavior normal.        Thought Content: Thought content normal.        Judgment: Judgment normal.     No results found.  Assessment/plan: Russell Ortiz is a 50 y.o. male present for CPE Herpes zoster without complication/Postherpetic neuralgia Rash completed cleared.  - discussed taper off gaba today. Instructions provided.   Hyperlipidemia, unspecified hyperlipidemia type/Overweight (BMI 25.0-29.9) - unable to tolerate statin - continue repatha q 14 d injection.  - CBC with Differential/Platelet - Comprehensive metabolic panel - Lipid panel - TSH Influenza vaccine needed Administered today Elevated hemoglobin A1c - Hemoglobin A1c Neutropenia, unspecified type  ([place]) CBc collected today Prostate cancer screening - PSA Routine general medical examination at a health care facility Colonoscopy: [name] with a P-uncle colon cancer at 60. Completed 06/01/2018, Dr. Hilarie Fredrickson 5 yr follow up. Due 2024. Immunizations:  tdap UTD 04/2018, influenza - updated today . covid series completed.  Infectious disease screening: HIV and hep c screen completed PSA: no fhx. Collected today Patient was encouraged to exercise greater than 150 minutes a week. Patient was encouraged to choose a diet filled with fresh fruits and vegetables, and lean meats. AVS provided to patient today for education/recommendation on gender specific health and safety maintenance. Return in about 1 year (around 05/18/2022) for CPE (30 min).  Orders Placed This Encounter  Procedures   Flu Vaccine QUAD 6+ mos PF IM (Fluarix Quad PF)   CBC with Differential/Platelet   Comprehensive metabolic panel   Hemoglobin A1c   Lipid panel   TSH   PSA   Orders Placed This Encounter  Procedures   Flu Vaccine QUAD 6+ mos PF IM (Fluarix Quad PF)   CBC with Differential/Platelet   Comprehensive metabolic panel   Hemoglobin A1c   Lipid panel   TSH   PSA   Meds ordered this encounter  Medications   Evolocumab 140 MG/ML SOAJ    Sig: INJECT 1 ML INTO THE SKIN EVERY 14 DAYS.    Dispense:  2 mL    Refill:  11   fluticasone (FLONASE) 50 MCG/ACT nasal spray    Sig: Place 2 sprays into both nostrils daily.    Dispense:  16 g    Refill:  6    Referral Orders  No referral(s) requested today  Note is dictated utilizing voice recognition software. Although note has been proof read prior to signing, occasional typographical errors still can be missed. If any questions arise, please do not hesitate to call for verification.  Electronically signed by: Howard Pouch, DO [place]

## 2021-05-20 ENCOUNTER — Other Ambulatory Visit (HOSPITAL_BASED_OUTPATIENT_CLINIC_OR_DEPARTMENT_OTHER): Payer: Self-pay

## 2021-06-02 ENCOUNTER — Encounter (HOSPITAL_BASED_OUTPATIENT_CLINIC_OR_DEPARTMENT_OTHER): Payer: Self-pay

## 2021-06-03 ENCOUNTER — Other Ambulatory Visit (HOSPITAL_BASED_OUTPATIENT_CLINIC_OR_DEPARTMENT_OTHER): Payer: Self-pay

## 2021-06-04 ENCOUNTER — Other Ambulatory Visit (HOSPITAL_BASED_OUTPATIENT_CLINIC_OR_DEPARTMENT_OTHER): Payer: Self-pay

## 2021-07-07 ENCOUNTER — Ambulatory Visit (INDEPENDENT_AMBULATORY_CARE_PROVIDER_SITE_OTHER): Payer: No Typology Code available for payment source | Admitting: Family Medicine

## 2021-07-07 ENCOUNTER — Other Ambulatory Visit (HOSPITAL_BASED_OUTPATIENT_CLINIC_OR_DEPARTMENT_OTHER): Payer: Self-pay

## 2021-07-07 ENCOUNTER — Other Ambulatory Visit: Payer: Self-pay

## 2021-07-07 ENCOUNTER — Encounter: Payer: Self-pay | Admitting: Family Medicine

## 2021-07-07 VITALS — BP 132/80 | HR 60 | Temp 98.0°F | Wt 222.4 lb

## 2021-07-07 DIAGNOSIS — L219 Seborrheic dermatitis, unspecified: Secondary | ICD-10-CM

## 2021-07-07 DIAGNOSIS — B351 Tinea unguium: Secondary | ICD-10-CM | POA: Diagnosis not present

## 2021-07-07 MED ORDER — KETOCONAZOLE 2 % EX SHAM
1.0000 "application " | MEDICATED_SHAMPOO | CUTANEOUS | 0 refills | Status: AC
  Filled 2021-07-07: qty 120, 14d supply, fill #0

## 2021-07-07 MED ORDER — TERBINAFINE HCL 250 MG PO TABS
250.0000 mg | ORAL_TABLET | Freq: Every day | ORAL | 0 refills | Status: DC
  Filled 2021-07-07: qty 28, 28d supply, fill #0

## 2021-07-07 MED ORDER — TERBINAFINE HCL 250 MG PO TABS
250.0000 mg | ORAL_TABLET | Freq: Every day | ORAL | 2 refills | Status: AC
  Filled 2021-07-07: qty 28, 28d supply, fill #0
  Filled 2021-07-30: qty 28, 28d supply, fill #1
  Filled 2022-04-01: qty 28, 28d supply, fill #2

## 2021-07-07 NOTE — Patient Instructions (Addendum)
Your scalp is a skin condition.  The shampoo (twice a week) for 4 weeks.   Pill for 12 weeks to treat scalp and toe nail.

## 2021-07-07 NOTE — Progress Notes (Signed)
This visit occurred during the SARS-CoV-2 public health emergency.  Safety protocols were in place, including screening questions prior to the visit, additional usage of staff PPE, and extensive cleaning of exam room while observing appropriate contact time as indicated for disinfecting solutions.    Russell Ortiz , February 03, 1971, 51 y.o., male MRN: [id number] Patient Care Team    Relationship Specialty Notifications Start End  Ma Hillock, DO PCP - General Family Medicine  08/17/15   Pyrtle, Lajuan Lines, MD Consulting Physician Gastroenterology  04/22/19   Alda Berthold, [place] Physician Neurology  03/13/20     Chief Complaint  Patient presents with   Rash    Since Sunday. After rash started pt's ears began to feel like they were clogged like they would pop.     Subjective: Pt presents for an OV with complaints of rash of scalp of 3 days. He had an episode of shingles on his scalp 3 months ago.  Pt also reports his ears feel clogged since onset of rash.  Area is intensely pruritic per patient.  He has not seen any blisters.  He does not have any fevers.  He denies any changes in any shampoos or new hats etc.  He does not have a history of eczema or psoriasis in the past.  He does report toenail fungal infection.  Depression screen [facility] 2/9 05/17/2021 05/04/2021 04/22/2020 04/22/2019 04/13/2018  Decreased Interest 0 0 0 0 0  Down, Depressed, Hopeless 0 0 0 0 0  PHQ - 2 Score 0 0 0 0 0    Allergies  Allergen Reactions   Statins Other (See Comments)    mylagia   Social History   Social History Narrative   Married. 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    Wears seat belt. Exercises 3x or greater a week.    Former Smoker. Quit 1999, Occasional ETOH, no recreational drugs.    Drinks caffeine beverages. Uses herbal remedies. Takes a multivitamin.    Smoker alarm in the home.    Feels safe in his relationships.    Right Handed    Lives in one story home      Past Medical History:  Diagnosis Date   Genital warts    Hyperlipidemia    Mass of left thigh 04/06/2018   PUD (peptic ulcer disease)    Past Surgical History:  Procedure Laterality Date   VASECTOMY  2010   Family History  Problem Relation Age of Onset   Lung cancer Father    Asthma Brother    Stroke Brother    Diabetes Maternal Grandfather    Diabetes Maternal Uncle    Hyperlipidemia Maternal Grandmother    Colon cancer Paternal Uncle    Prostate cancer Paternal Uncle    Stomach cancer Neg Hx    Esophageal cancer Neg Hx    Colon polyps Neg Hx    Allergies as of 07/07/2021       Reactions   Statins Other (See Comments)   mylagia        Medication List        Accurate as of July 07, 2021  8:13 AM. If you have any questions, ask your nurse or doctor.          fluticasone 50 MCG/ACT nasal spray Commonly known as: FLONASE Place 2 sprays into both nostrils daily.   Repatha SureClick 570 MG/ML Soaj Generic drug: Evolocumab  INJECT 1 ML INTO THE SKIN EVERY 14 DAYS.        All past medical history, surgical history, allergies, family history, immunizations andmedications were updated in the EMR today and reviewed under the history and medication portions of their EMR.     ROS Negative, with the exception of above mentioned in HPI   Objective:  BP 132/80    Pulse 60    Temp 98 F (36.7 C)    Wt 222 lb 6.4 oz (100.9 kg)    SpO2 97%    BMI 28.55 kg/m  Body mass index is 28.55 kg/m.  Physical Exam Vitals and nursing note reviewed.  Constitutional:      General: He is not in acute distress.    Appearance: Normal appearance. He is not ill-appearing, toxic-appearing or diaphoretic.  HENT:     Head: Normocephalic and atraumatic.      Comments: Pearly/shiny appearance to plaque-like lesions on scalp  Eyes:     General: No scleral icterus.       Right eye: No discharge.        Left eye: No discharge.     Extraocular  Movements: Extraocular movements intact.     Pupils: Pupils are equal, round, and reactive to light.  Skin:    General: Skin is warm and dry.     Coloration: Skin is not jaundiced or pale.     Findings: No rash.  Neurological:     Mental Status: He is alert and oriented to person, place, and time. Mental status is at baseline.  Psychiatric:        Mood and Affect: Mood normal.        Behavior: Behavior normal.        Thought Content: Thought content normal.        Judgment: Judgment normal.     No results found. No results found. No results found for this or any previous visit (from the past 24 hour(s)).  Assessment/Plan: Russell Ortiz is a 51 y.o. male present for OV for  Seborrheic dermatitis of scalp Terbinafine p.o. daily Ketoconazole 2 times a week x4 weeks.  Toenail fungus Terbinafine 250 mg x 12 weeks.  Reviewed expectations re: course of current medical issues. Discussed self-management of symptoms. Outlined signs and symptoms indicating need for more acute intervention. Patient verbalized understanding and all questions were answered. Patient received an After-Visit Summary.    No orders of the defined types were placed in this encounter.  No orders of the defined types were placed in this encounter.  Referral Orders  No referral(s) requested today     Note is dictated utilizing voice recognition software. Although note has been proof read prior to signing, occasional typographical errors still can be missed. If any questions arise, please do not hesitate to call for verification.   electronically signed by:  Howard Pouch, DO  [place]

## 2021-07-12 ENCOUNTER — Encounter: Payer: Self-pay | Admitting: Family Medicine

## 2021-07-12 DIAGNOSIS — L219 Seborrheic dermatitis, unspecified: Secondary | ICD-10-CM | POA: Insufficient documentation

## 2021-07-30 ENCOUNTER — Other Ambulatory Visit (HOSPITAL_BASED_OUTPATIENT_CLINIC_OR_DEPARTMENT_OTHER): Payer: Self-pay

## 2021-08-26 ENCOUNTER — Other Ambulatory Visit: Payer: Self-pay | Admitting: Family Medicine

## 2021-08-26 ENCOUNTER — Other Ambulatory Visit (HOSPITAL_BASED_OUTPATIENT_CLINIC_OR_DEPARTMENT_OTHER): Payer: Self-pay

## 2021-08-27 ENCOUNTER — Other Ambulatory Visit (HOSPITAL_BASED_OUTPATIENT_CLINIC_OR_DEPARTMENT_OTHER): Payer: Self-pay

## 2021-08-27 ENCOUNTER — Encounter (HOSPITAL_BASED_OUTPATIENT_CLINIC_OR_DEPARTMENT_OTHER): Payer: Self-pay | Admitting: Pharmacist

## 2021-09-26 IMAGING — MR MR HEAD W/O CM
10 of 11 series · 43 of 48 positions shown · non-contrast
Comparison: Prior head CT from earlier the same day.

CLINICAL DATA: Initial evaluation for acute left upper extremity
tingling.

EXAM:
MRI HEAD WITHOUT CONTRAST
TECHNIQUE: Multiplanar, multiecho pulse sequences of the brain and surrounding
structures were obtained without intravenous contrast.

[Series 5: DWI · axial · 3.0mm · 0.88mm/px · z∈[-53,+95]mm · 10 of 102 slices shown (1 of 4)]
[im 1/102]
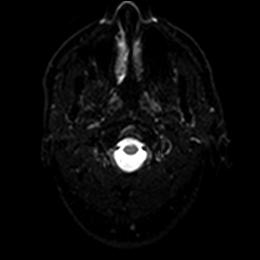
[im 12/102]
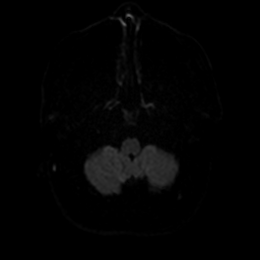
[im 23/102]
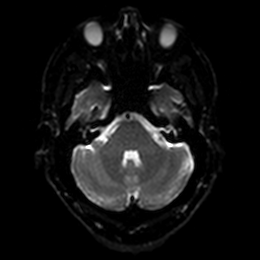
[im 34/102]
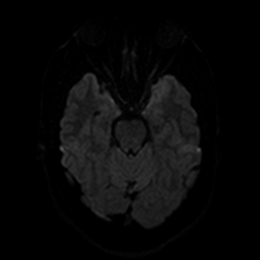
[im 45/102]
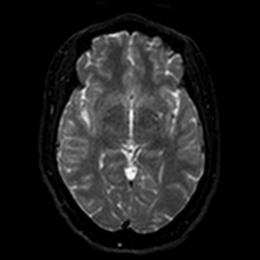
[im 57/102]
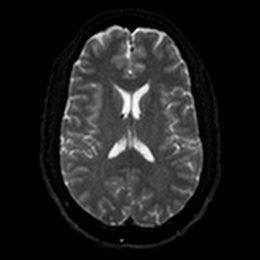
[im 68/102]
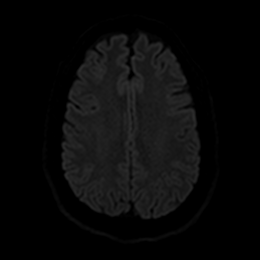
[im 79/102]
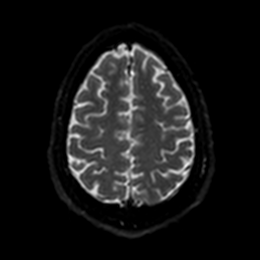
[im 90/102]
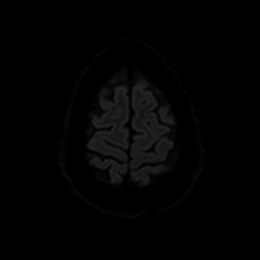
[im 102/102]
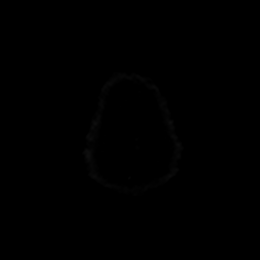

[Series 6: DWI · axial · 3.0mm · 0.88mm/px · z∈[-53,+95]mm · 5 of 51 slices shown (2 of 4)]
[im 1/51]
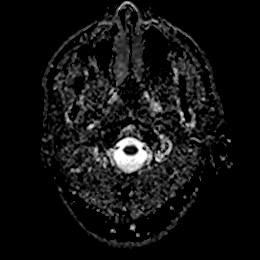
[im 13/51]
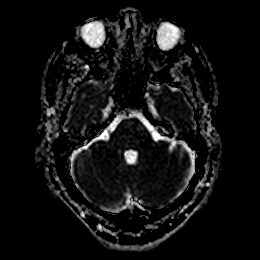
[im 26/51]
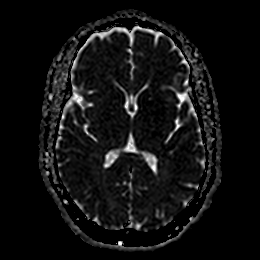
[im 38/51]
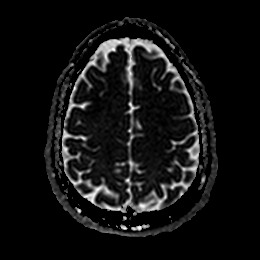
[im 51/51]
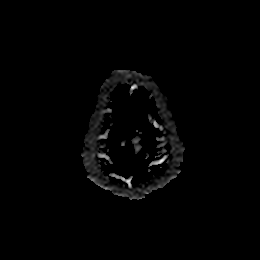

[Series 7: DWI · coronal · 4.0mm · 0.88mm/px · 6 of 70 slices shown (3 of 4)]
[im 1/70]
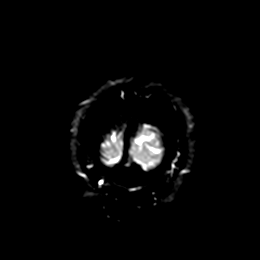
[im 14/70]
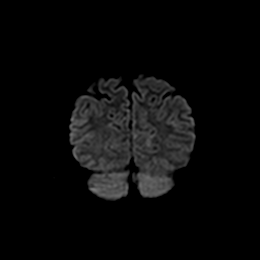
[im 28/70]
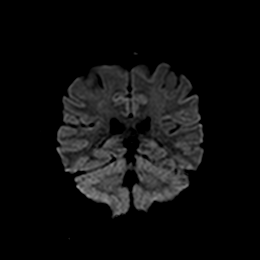
[im 42/70]
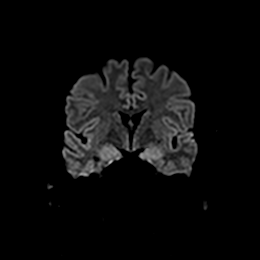
[im 56/70]
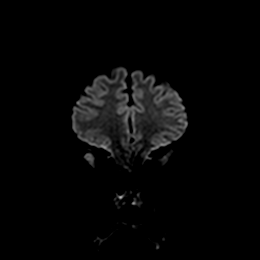
[im 70/70]
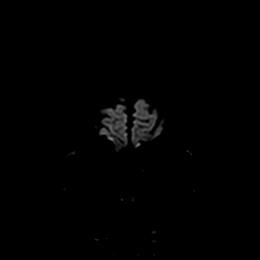

[Series 8: DWI · coronal · 4.0mm · 0.88mm/px · 3 of 35 slices shown (4 of 4)]
[im 1/35]
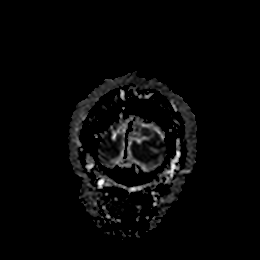
[im 18/35]
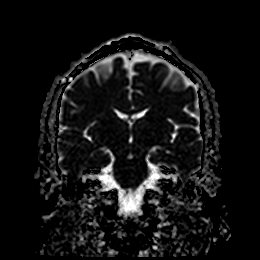
[im 35/35]
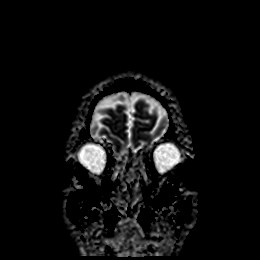

[Series 9: T1 · sagittal · 5.0mm · 0.78mm/px · 2 of 23 slices shown]
[im 1/23]
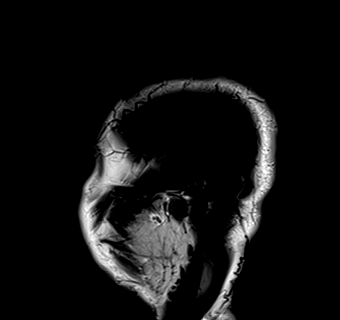
[im 23/23]
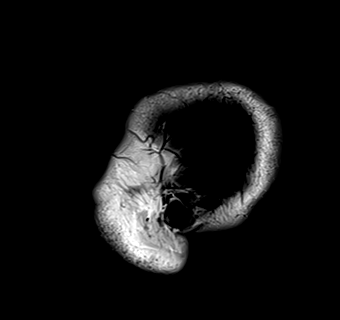

[Series 10: T2 · axial · 5.0mm · 0.72mm/px · z∈[-55,+99]mm · 2 of 27 slices shown (1 of 2)]
[im 1/27]
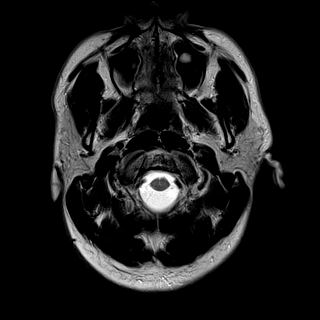
[im 27/27]
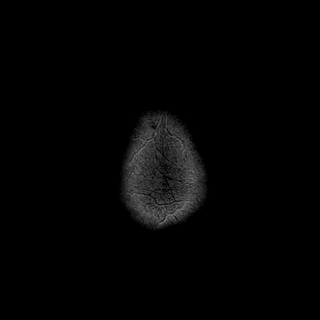

[Series 11: FLAIR · axial · 5.0mm · 0.45mm/px · z∈[-55,+99]mm · 2 of 27 slices shown]
[im 1/27]
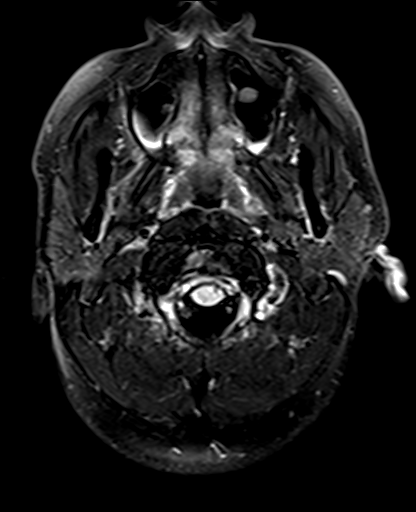
[im 27/27]
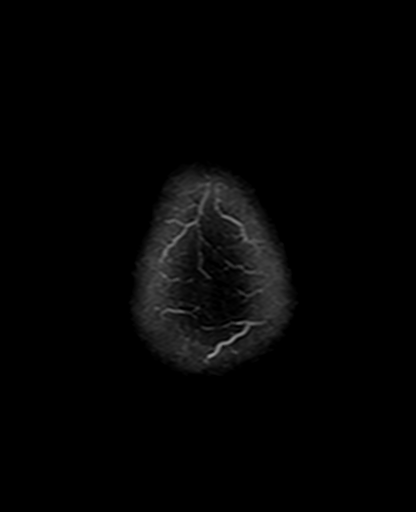

[Series 13: pha_images · axial · 3.0mm · 0.94mm/px · z∈[-63,+112]mm · 5 of 59 slices shown]
[im 1/59]
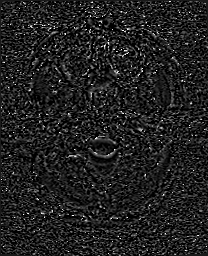
[im 15/59]
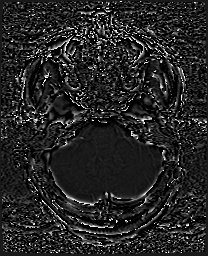
[im 30/59]
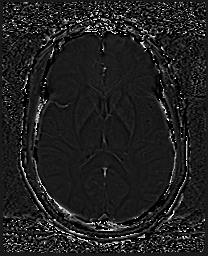
[im 44/59]
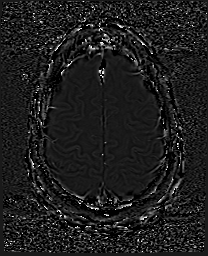
[im 59/59]
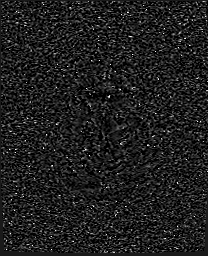

[Series 14: swi_images · axial · 3.0mm · 0.94mm/px · z∈[-63,+112]mm · 5 of 60 slices shown]
[im 1/60]
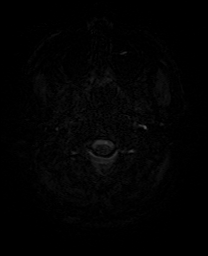
[im 15/60]
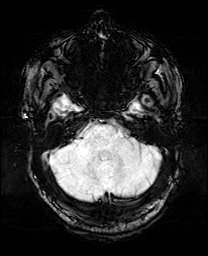
[im 30/60]
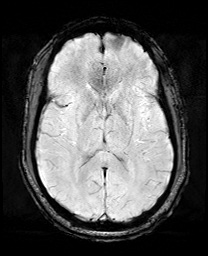
[im 45/60]
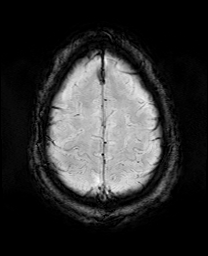
[im 60/60]
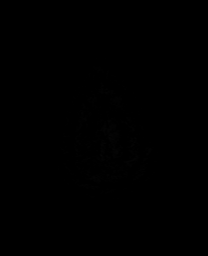

[Series 17: T2 · coronal · 5.0mm · 0.34mm/px · 3 of 29 slices shown (2 of 2)]
[im 1/29]
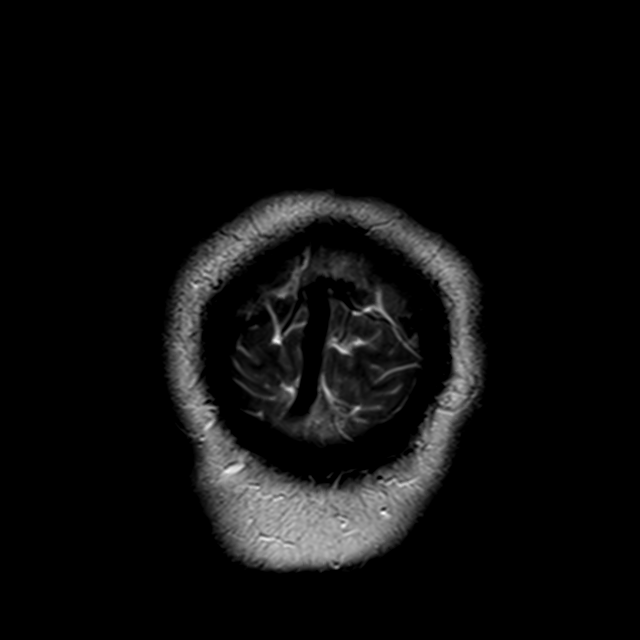
[im 15/29]
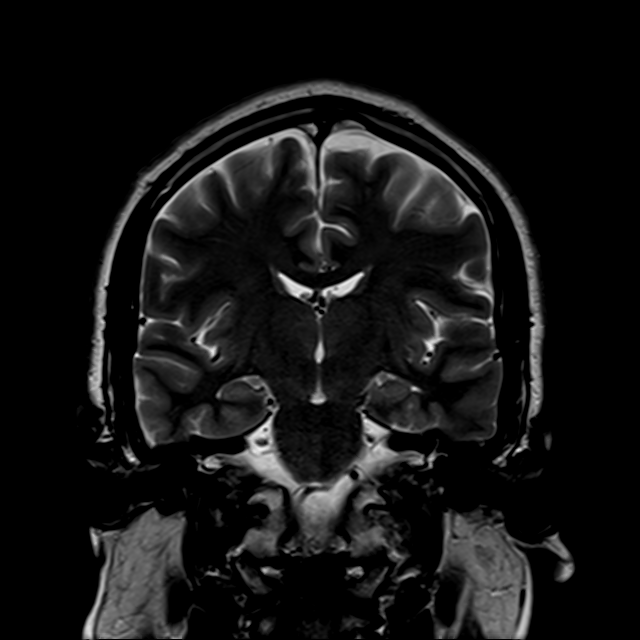
[im 29/29]
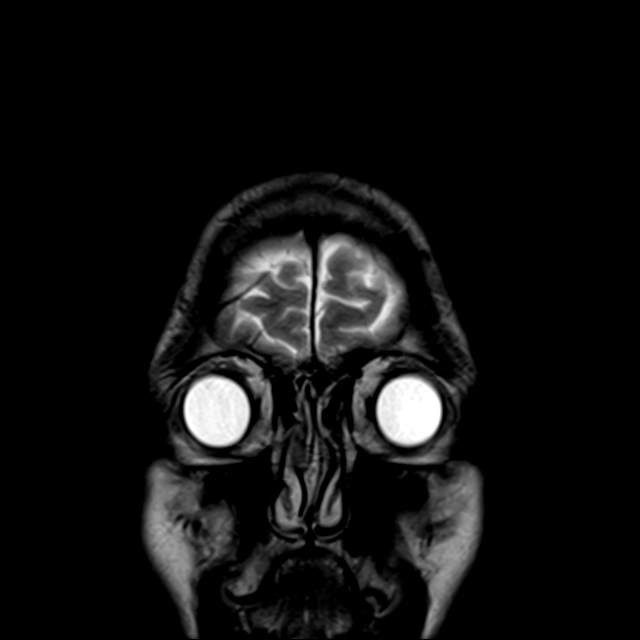

[43 of 48 positions shown; findings below may reference images not displayed]

FINDINGS: Brain: Cerebral volume within normal limits for patient age. No
focal parenchymal signal abnormality identified.

No abnormal foci of restricted diffusion to suggest acute or
subacute ischemia. Gray-white matter differentiation well
maintained. No encephalomalacia to suggest chronic infarction. No
foci of susceptibility artifact to suggest acute or chronic
intracranial hemorrhage.

No mass lesion, midline shift or mass effect. No hydrocephalus. No
extra-axial fluid collection. Major dural sinuses are grossly
patent.

Pituitary gland and suprasellar region are normal. Midline
structures intact and normal.

Vascular: Major intracranial vascular flow voids well maintained and
normal in appearance.

Skull and upper cervical spine: Craniocervical junction normal.
Visualized upper cervical spine within normal limits. Bone marrow
signal intensity normal. No scalp soft tissue abnormality.

Sinuses/Orbits: Globes and orbital soft tissues within normal
limits.

Paranasal sinuses are clear. No mastoid effusion. Inner ear
structures normal.

Other: None.
IMPRESSION: Normal brain MRI. No acute intracranial abnormality identified.

## 2021-10-25 ENCOUNTER — Other Ambulatory Visit (HOSPITAL_BASED_OUTPATIENT_CLINIC_OR_DEPARTMENT_OTHER): Payer: Self-pay

## 2021-12-09 ENCOUNTER — Ambulatory Visit: Payer: No Typology Code available for payment source | Admitting: Family Medicine

## 2021-12-16 ENCOUNTER — Ambulatory Visit: Payer: No Typology Code available for payment source | Admitting: Family Medicine

## 2021-12-17 ENCOUNTER — Telehealth: Payer: Self-pay

## 2021-12-17 ENCOUNTER — Ambulatory Visit: Payer: No Typology Code available for payment source | Admitting: Family Medicine

## 2021-12-17 NOTE — Telephone Encounter (Signed)
No Show/Cancel within 24 hours  Patient called [facility] in [place] unable to get thru traffic - resch appt for 7/17 with [place], provider booked -  per provider no charge

## 2021-12-17 NOTE — Progress Notes (Deleted)
Russell Ortiz , 1970/08/15, 51 y.o., male MRN: [id number] Patient Care Team    Relationship Specialty Notifications Start End  Russell Hillock, DO PCP - General Family Medicine  08/17/15   Pyrtle, Lajuan Russell Ortiz Consulting Physician Gastroenterology  04/22/19   Russell Ortiz, [place] Physician Neurology  03/13/20     No chief complaint on file.    Subjective: Pt presents for an OV with complaints of rash of scalp of 3 days. He had an episode of shingles on his scalp 3 months ago.  Pt also reports his ears feel clogged since onset of rash.  Area is intensely pruritic per patient.  He has not seen any blisters.  He does not have any fevers.  He denies any changes in any shampoos or new hats etc.  He does not have a history of eczema or psoriasis in the past.  He does report toenail fungal infection.     05/17/2021    9:56 AM 05/04/2021    3:52 PM 04/22/2020    8:59 AM 04/22/2019    9:32 AM 04/13/2018    8:16 AM  Depression screen PHQ 2/9  Decreased Interest 0 0 0 0 0  Down, Depressed, Hopeless 0 0 0 0 0  PHQ - 2 Score 0 0 0 0 0    Allergies  Allergen Reactions   Statins Other (See Comments)    mylagia   Social History   Social History Narrative   Married. 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    Wears seat belt. Exercises 3x or greater a week.    Former Smoker. Quit 1999, Occasional ETOH, no recreational drugs.    Drinks caffeine beverages. Uses herbal remedies. Takes a multivitamin.    Smoker alarm in the home.    Feels safe in his relationships.    Right Handed   Lives in one story home      Past Medical History:  Diagnosis Date   Genital warts    Herpes zoster without complication 95/18/8416   Hyperlipidemia    Mass of left thigh 04/06/2018   PUD (peptic ulcer disease)    Past Surgical History:  Procedure Laterality Date   VASECTOMY  2010   Family History  Problem Relation Age of Onset   Lung cancer  Father    Asthma Brother    Stroke Brother    Diabetes Maternal Grandfather    Diabetes Maternal Uncle    Hyperlipidemia Maternal Grandmother    Colon cancer Paternal Uncle    Prostate cancer Paternal Uncle    Stomach cancer Neg Hx    Esophageal cancer Neg Hx    Colon polyps Neg Hx    Allergies as of 12/17/2021       Reactions   Statins Other (See Comments)   mylagia        Medication List        Accurate as of December 17, 2021 12:56 PM. If you have any questions, ask your nurse or doctor.          fluticasone 50 MCG/ACT nasal spray Commonly known as: FLONASE Place 2 sprays into both nostrils daily.   Repatha SureClick 606 MG/ML Soaj Generic drug: Evolocumab INJECT 1 ML INTO THE SKIN EVERY 14 DAYS.        All past medical history, surgical history, allergies, family history, immunizations andmedications were updated in the EMR today and  reviewed under the history and medication portions of their EMR.     ROS Negative, with the exception of above mentioned in HPI   Objective:  There were no vitals taken for this visit. There is no height or weight on file to calculate BMI.  Physical Exam Vitals and nursing note reviewed.  Constitutional:      General: He is not in acute distress.    Appearance: Normal appearance. He is not ill-appearing, toxic-appearing or diaphoretic.  HENT:     Head: Normocephalic and atraumatic.      Comments: Pearly/shiny appearance to plaque-like lesions on scalp  Eyes:     General: No scleral icterus.       Right eye: No discharge.        Left eye: No discharge.     Extraocular Movements: Extraocular movements intact.     Pupils: Pupils are equal, round, and reactive to light.  Skin:    General: Skin is warm and dry.     Coloration: Skin is not jaundiced or pale.     Findings: No rash.  Neurological:     Mental Status: He is alert and oriented to person, place, and time. Mental status is at baseline.  Psychiatric:         Mood and Affect: Mood normal.        Behavior: Behavior normal.        Thought Content: Thought content normal.        Judgment: Judgment normal.      No results found. No results found. No results found for this or any previous visit (from the past 24 hour(s)).  Assessment/Plan: [name] is a 51 y.o. male present for OV for  Seborrheic dermatitis of scalp Terbinafine p.o. daily Ketoconazole 2 times a week x4 weeks.  Toenail fungus Terbinafine 250 mg x 12 weeks.  Reviewed expectations re: course of current medical issues. Discussed self-management of symptoms. Outlined signs and symptoms indicating need for more acute intervention. Patient verbalized understanding and all questions were answered. Patient received an After-Visit Summary.    No orders of the defined types were placed in this encounter.  No orders of the defined types were placed in this encounter.  Referral Orders  No referral(s) requested today     Note is dictated utilizing voice recognition software. Although note has been proof read prior to signing, occasional typographical errors still can be missed. If any questions arise, please do not hesitate to call for verification.   electronically signed by:  Howard Pouch, DO  [place]

## 2021-12-20 ENCOUNTER — Encounter: Payer: Self-pay | Admitting: Family Medicine

## 2021-12-20 ENCOUNTER — Ambulatory Visit (INDEPENDENT_AMBULATORY_CARE_PROVIDER_SITE_OTHER): Payer: No Typology Code available for payment source | Admitting: Family Medicine

## 2021-12-20 ENCOUNTER — Other Ambulatory Visit (HOSPITAL_BASED_OUTPATIENT_CLINIC_OR_DEPARTMENT_OTHER): Payer: Self-pay

## 2021-12-20 ENCOUNTER — Ambulatory Visit: Payer: No Typology Code available for payment source | Admitting: Family Medicine

## 2021-12-20 VITALS — BP 146/83 | HR 63 | Temp 97.9°F | Ht 74.0 in | Wt 220.0 lb

## 2021-12-20 DIAGNOSIS — L249 Irritant contact dermatitis, unspecified cause: Secondary | ICD-10-CM | POA: Diagnosis not present

## 2021-12-20 MED ORDER — METHYLPREDNISOLONE ACETATE 80 MG/ML IJ SUSP
80.0000 mg | Freq: Once | INTRAMUSCULAR | Status: AC
  Administered 2021-12-20: 80 mg via INTRAMUSCULAR

## 2021-12-20 MED ORDER — FLUOCINONIDE 0.05 % EX CREA
1.0000 | TOPICAL_CREAM | Freq: Two times a day (BID) | CUTANEOUS | 1 refills | Status: DC
  Filled 2021-12-20: qty 30, 7d supply, fill #0
  Filled 2022-01-20: qty 30, 7d supply, fill #1
  Filled 2022-03-21: qty 30, 7d supply, fill #2

## 2021-12-20 NOTE — Addendum Note (Signed)
Addended by: Kavin Leech on: 12/20/2021 08:40 AM   Modules accepted: Orders

## 2021-12-20 NOTE — Progress Notes (Signed)
Russell Ortiz , November 23, 1970, 51 y.o., male MRN: [id number] Patient Care Team    Relationship Specialty Notifications Start End  Russell Hillock, DO PCP - General Family Medicine  08/17/15   Russell Ortiz, Russell Lines, MD Consulting Physician Gastroenterology  04/22/19   Russell Ortiz, Russell Ortiz Physician Neurology  03/13/20     Chief Complaint  Patient presents with   Rash    Pt present with was R arm and neck x 3 weeks;      Subjective: Pt presents for an OV with complaints of rash of right arm and hand for 3 weeks. He is uncertain what he came in contact with. Area is mild raised, red and itchy. He had left over kenalog cream and was using, seemed to help some.     05/17/2021    9:56 AM 05/04/2021    3:52 PM 04/22/2020    8:59 AM 04/22/2019    9:32 AM 04/13/2018    8:16 AM  Depression screen PHQ 2/9  Decreased Interest 0 0 0 0 0  Down, Depressed, Hopeless 0 0 0 0 0  PHQ - 2 Score 0 0 0 0 0    Allergies  Allergen Reactions   Statins Other (See Comments)    mylagia   Social History   Social History Narrative   Married. 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    Wears seat belt. Exercises 3x or greater a week.    Former Smoker. Quit 1999, Occasional ETOH, no recreational drugs.    Drinks caffeine beverages. Uses herbal remedies. Takes a multivitamin.    Smoker alarm in the home.    Feels safe in his relationships.    Right Handed   Lives in one story home      Past Medical History:  Diagnosis Date   Genital warts    Herpes zoster without complication 69/67/8938   Hyperlipidemia    Mass of left thigh 04/06/2018   PUD (peptic ulcer disease)    Past Surgical History:  Procedure Laterality Date   VASECTOMY  2010   Family History  Problem Relation Age of Onset   Lung cancer Father    Asthma Brother    Stroke Brother    Diabetes Maternal Grandfather    Diabetes Maternal Uncle    Hyperlipidemia Maternal Grandmother     Colon cancer Paternal Uncle    Prostate cancer Paternal Uncle    Stomach cancer Neg Hx    Esophageal cancer Neg Hx    Colon polyps Neg Hx    Allergies as of 12/20/2021       Reactions   Statins Other (See Comments)   mylagia        Medication List        Accurate as of December 20, 2021  8:25 AM. If you have any questions, ask your nurse or doctor.          fluocinonide cream 0.05 % Commonly known as: LIDEX Apply 1 Application topically 2 (two) times daily. Started by: Russell Pouch, DO   fluticasone 50 MCG/ACT nasal spray Commonly known as: FLONASE Place 2 sprays into both nostrils daily.   Repatha SureClick 101 MG/ML Soaj Generic drug: Evolocumab INJECT 1 ML INTO THE SKIN EVERY 14 DAYS.        All past medical history, surgical history, allergies, family history, immunizations andmedications were updated in the EMR today and reviewed under the  history and medication portions of their EMR.     ROS Negative, with the exception of above mentioned in HPI   Objective:  BP (!) 146/83   Pulse 63   Temp 97.9 F (36.6 C) (Oral)   Ht '6\' 2"'$  (1.88 m)   Wt 220 lb (99.8 kg)   SpO2 98%   BMI 28.25 kg/m  Body mass index is 28.25 kg/m. Physical Exam Vitals and nursing note reviewed.  Constitutional:      General: He is not in acute distress.    Appearance: Normal appearance. He is not ill-appearing, toxic-appearing or diaphoretic.  HENT:     Head: Normocephalic and atraumatic.  Eyes:     General: No scleral icterus.       Right eye: No discharge.        Left eye: No discharge.     Extraocular Movements: Extraocular movements intact.     Pupils: Pupils are equal, round, and reactive to light.  Skin:    General: Skin is warm and dry.     Coloration: Skin is not jaundiced or pale.     Findings: Rash present.     Comments: Right forearm rash- 6 in by 3 in. Red raised area with small vesicles along border.   Neurological:     Mental Status: He is alert and  oriented to person, place, and time. Mental status is at baseline.  Psychiatric:        Mood and Affect: Mood normal.        Behavior: Behavior normal.        Thought Content: Thought content normal.        Judgment: Judgment normal.        No results found. No results found. No results found for this or any previous visit (from the past 24 hour(s)).  Assessment/Plan: Russell Ortiz is a 51 y.o. male present for OV for  Irritant contact dermatitis, unspecified trigger Area is mild raised, red w/ very small vesicles present. Appears as possible sumac dermatitis vs irritant.  IM depo medrol 80 inj IM today Lidex cream BID  F/u PRN   Reviewed expectations re: course of current medical issues. Discussed self-management of symptoms. Outlined signs and symptoms indicating need for more acute intervention. Patient verbalized understanding and all questions were answered. Patient received an After-Visit Summary.    No orders of the defined types were placed in this encounter.  Meds ordered this encounter  Medications   fluocinonide cream (LIDEX) 0.05 %    Sig: Apply 1 Application topically 2 (two) times daily.    Dispense:  60 g    Refill:  1   Referral Orders  No referral(s) requested today     Note is dictated utilizing voice recognition software. Although note has been proof read prior to signing, occasional typographical errors still can be missed. If any questions arise, please do not hesitate to call for verification.   electronically signed by:  Russell Pouch, DO  [place]

## 2021-12-20 NOTE — Patient Instructions (Signed)
No follow-ups on file.        Great to see you today.  I have refilled the medication(s) we provide.   If labs were collected, we will inform you of lab results once received either by echart message or telephone call.   - echart message- for normal results that have been seen by the patient already.   - telephone call: abnormal results or if patient has not viewed results in their echart.   Dermatitis Poison ivy dermatitis is redness and soreness of the skin caused by chemicals in the leaves of the poison ivy plant. You may have very bad itching, swelling, a rash, and blisters. What are the causes? Touching a poison ivy plant. Touching something that has the chemical on it. This may include animals or objects that have come in contact with the plant. What increases the risk? Going outdoors often in wooded or [place] areas. Going outdoors without wearing protective clothing, such as closed shoes, long pants, and a long-sleeved shirt. What are the signs or symptoms?  Skin redness. Very bad itching. A rash that often includes bumps and blisters. The rash usually appears 48 hours after exposure, if you have been exposed before. If this is the first time you have been exposed, the rash may not appear until a week after exposure. Swelling. This may occur if the reaction is very bad. Symptoms usually last for 1-2 weeks. The first time you develop this condition, symptoms may last 3-4 weeks. How is this treated? This condition may be treated with: Hydrocortisone cream or calamine lotion to relieve itching. Oatmeal baths to soothe the skin. Medicines, such as over-the-counter antihistamine tablets. Oral steroid medicine for more severe reactions. Follow these instructions at home: Medicines Take or apply over-the-counter and prescription medicines only as told by your doctor. Use hydrocortisone cream or calamine lotion as needed to help with itching. General instructions Do not  scratch or rub your skin. Put a cold, wet cloth (cold compress) on the affected areas or take baths in cool water. This will help with itching. Avoid hot baths and showers. Take oatmeal baths as needed. Use colloidal oatmeal. You can get this at a pharmacy or grocery store. Follow the instructions on the package. While you have the rash, wash your clothes right after you wear them. Keep all follow-up visits as told by your health care provider. This is important. How is this prevented?  Know what poison ivy looks like, so you can avoid it. This plant has three leaves with flowering branches on a single stem. The leaves are glossy. The leaves have uneven edges that come to a point at the front. If you touch poison ivy, wash your skin with soap and water right away. Be sure to wash under your fingernails. When hiking or camping, wear long pants, a long-sleeved shirt, tall socks, and hiking boots. You can also use a lotion on your skin that helps to prevent contact with poison ivy. If you think that your clothes or outdoor gear came in contact with poison ivy, rinse them off with a garden hose before you bring them inside your house. When doing yard work or gardening, wear gloves, long sleeves, long pants, and boots. Wash your garden tools and gloves if they come in contact with poison ivy. If you think that your pet has come into contact with poison ivy, wash him or her with pet shampoo and water. Make sure to wear gloves while washing your pet. Contact a doctor  if: You have open sores in the rash area. You have more redness, swelling, or pain in the rash area. You have redness that spreads beyond the rash area. You have fluid, blood, or pus coming from the rash area. You have a fever. You have a rash over a large area of your body. You have a rash on your eyes, mouth, or genitals. Your rash does not get better after a few weeks. Get help right away if: Your face swells or your eyes swell  shut. You have trouble breathing. You have trouble swallowing. These symptoms may be an emergency. Do not wait to see if the symptoms will go away. Get medical help right away. Call your local emergency services (911 in the [place] Do not drive yourself to the hospital. Summary Poison ivy dermatitis is redness and soreness of the skin caused by chemicals in the leaves of the poison ivy plant. You may have skin redness, very bad itching, swelling, and a rash. Do not scratch or rub your skin. Take or apply over-the-counter and prescription medicines only as told by your doctor. This information is not intended to replace advice given to you by your health care provider. Make sure you discuss any questions you have with your health care provider. Document Revised: 03/08/2021 Document Reviewed: 03/08/2021 Elsevier Patient Education  [employer].

## 2022-01-03 ENCOUNTER — Ambulatory Visit (INDEPENDENT_AMBULATORY_CARE_PROVIDER_SITE_OTHER): Payer: No Typology Code available for payment source

## 2022-01-03 ENCOUNTER — Ambulatory Visit
Admission: EM | Admit: 2022-01-03 | Discharge: 2022-01-03 | Disposition: A | Discharge status: Home / Self Care | Payer: No Typology Code available for payment source | Attending: Nurse Practitioner | Admitting: Nurse Practitioner

## 2022-01-03 DIAGNOSIS — S60211A Contusion of right wrist, initial encounter: Secondary | ICD-10-CM | POA: Diagnosis not present

## 2022-01-03 DIAGNOSIS — M25531 Pain in right wrist: Secondary | ICD-10-CM | POA: Diagnosis not present

## 2022-01-03 MED ORDER — IBUPROFEN 800 MG PO TABS: 800.0000 mg | ORAL_TABLET | Freq: Three times a day (TID) | ORAL | 0 refills | Status: DC | PRN

## 2022-01-03 NOTE — Discharge Instructions (Addendum)
The x-rays are negative for fracture or dislocation. Take medication as prescribed.  Wear the wrist brace to help provide compression and support and when you are performing strenuous activity. RICE therapy.  Rest, ice, compression and elevation. Continue the use of ice by applying it for 20 minutes at a time, removing for 1 hour, then repeating. Continue ibuprofen to help with pain or discomfort. If symptoms do not improve within the next 2 to 4 weeks, recommend following up with orthopedics.  You can follow-up with [facility] at [phone] or [facility] in [place] at [phone].
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
## 2022-01-20 ENCOUNTER — Encounter (HOSPITAL_BASED_OUTPATIENT_CLINIC_OR_DEPARTMENT_OTHER): Payer: Self-pay | Admitting: Pharmacist

## 2022-01-20 ENCOUNTER — Other Ambulatory Visit (HOSPITAL_BASED_OUTPATIENT_CLINIC_OR_DEPARTMENT_OTHER): Payer: Self-pay

## 2022-01-21 ENCOUNTER — Other Ambulatory Visit (HOSPITAL_BASED_OUTPATIENT_CLINIC_OR_DEPARTMENT_OTHER): Payer: Self-pay

## 2022-01-21 ENCOUNTER — Telehealth: Payer: No Typology Code available for payment source | Admitting: Family Medicine

## 2022-01-21 DIAGNOSIS — B9689 Other specified bacterial agents as the cause of diseases classified elsewhere: Secondary | ICD-10-CM

## 2022-01-21 DIAGNOSIS — J329 Chronic sinusitis, unspecified: Secondary | ICD-10-CM | POA: Diagnosis not present

## 2022-01-21 MED ORDER — CEFDINIR 300 MG PO CAPS: 300.0000 mg | ORAL_CAPSULE | Freq: Two times a day (BID) | ORAL | 0 refills | Status: AC

## 2022-01-21 MED ORDER — PREDNISONE 10 MG PO TABS: 10.0000 mg | ORAL_TABLET | Freq: Every day | ORAL | 0 refills | Status: AC

## 2022-01-21 NOTE — Progress Notes (Signed)
Virtual Visit Consent   [name], you are scheduled for a virtual visit with a [facility] provider today. Just as with appointments in the office, your consent must be obtained to participate. Your consent will be active for this visit and any virtual visit you may have with one of our providers in the next 365 days. If you have a MyChart account, a copy of this consent can be sent to you electronically.  As this is a virtual visit, video technology does not allow for your provider to perform a traditional examination. This may limit your provider's ability to fully assess your condition. If your provider identifies any concerns that need to be evaluated in person or the need to arrange testing (such as labs, EKG, etc.), we will make arrangements to do so. Although advances in technology are sophisticated, we cannot ensure that it will always work on either your end or our end. If the connection with a video visit is poor, the visit may have to be switched to a telephone visit. With either a video or telephone visit, we are not always able to ensure that we have a secure connection.  By engaging in this virtual visit, you consent to the provision of healthcare and authorize for your insurance to be billed (if applicable) for the services provided during this visit. Depending on your insurance coverage, you may receive a charge related to this service.  I need to obtain your verbal consent now. Are you willing to proceed with your visit today? DONTAVIS TSCHANTZ has provided verbal consent on 01/21/2022 for a virtual visit (video or telephone). Dellia Nims, FNP  Date: 01/21/2022 3:20 PM  Virtual Visit via Video Note   I, Dellia Nims, connected with  [name]  ([id number], [birth year]) on 01/21/22 at  3:00 PM EDT by a video-enabled telemedicine application and verified that I am speaking with the correct person using two identifiers.  Location: Patient: Virtual Visit Location Patient: Home Provider:  Virtual Visit Location Provider: Home Office   I discussed the limitations of evaluation and management by telemedicine and the availability of in person appointments. The patient expressed understanding and agreed to proceed.    History of Present Illness: Russell Ortiz is a 51 y.o. who identifies as a male who was assigned male at birth, and is being seen today for head congestion, facial pain and pressure, no cough, no wheezing, no sob. No fever. Russell Ortiz  HPI: HPI  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   Cervical radiculopathy 04/20/2018   Neutropenia ([place]) 04/12/2017   Overweight (BMI 25.0-29.9) 11/28/2016   Elevated hemoglobin A1c 08/25/2016   Elevated liver function tests 03/12/2015   Hyperlipemia 03/12/2015    Allergies:  Allergies  Allergen Reactions   Statins Other (See Comments)    mylagia   Medications:  Current Outpatient Medications:    cefdinir (OMNICEF) 300 MG capsule, Take 1 capsule (300 mg total) by mouth 2 (two) times daily for 10 days., Disp: 20 capsule, Rfl: 0   predniSONE (DELTASONE) 10 MG tablet, Take 1 tablet (10 mg total) by mouth daily with breakfast for 6 days. Prednisone 10 mg - 6 day dose pack as directed, Disp: 1 tablet, Rfl: 0   Evolocumab 140 MG/ML SOAJ, INJECT 1 ML INTO THE SKIN EVERY 14 DAYS., Disp: 2 mL, Rfl: 11   fluocinonide cream (LIDEX) 2.97 %, Apply 1 Application topically 2 (two) times  daily., Disp: 60 g, Rfl: 1   fluticasone (FLONASE) 50 MCG/ACT nasal spray, Place 2 sprays into both nostrils daily., Disp: 16 g, Rfl: 6   ibuprofen (ADVIL) 800 MG tablet, Take 1 tablet (800 mg total) by mouth every 8 (eight) hours as needed., Disp: 30 tablet, Rfl: 0  Observations/Objective: Patient is well-developed, well-nourished in no acute distress.  Resting comfortably  at home.  Head is normocephalic, atraumatic.  No labored breathing.  Speech is clear and coherent with logical content.   Patient is alert and oriented at baseline.    Assessment and Plan: 1. Bacterial sinusitis  Increase fluids, med use and side effects discussed, urgetn care if sx persist or worsen.   Follow Up Instructions: I discussed the assessment and treatment plan with the patient. The patient was provided an opportunity to ask questions and all were answered. The patient agreed with the plan and demonstrated an understanding of the instructions.  A copy of instructions were sent to the patient via MyChart unless otherwise noted below.   Patient has requested to receive PHI (AVS, Work Notes, etc) pertaining to this video visit through e-mail as they are currently without active [place]. They have voiced understand that email is not considered secure and their health information could be viewed by someone other than the patient.   The patient was advised to call back or seek an in-person evaluation if the symptoms worsen or if the condition fails to improve as anticipated.  Time:  I spent 10 minutes with the patient via telehealth technology discussing the above problems/concerns.    Dellia Nims, FNP

## 2022-01-21 NOTE — Patient Instructions (Signed)

## 2022-01-26 ENCOUNTER — Other Ambulatory Visit (HOSPITAL_BASED_OUTPATIENT_CLINIC_OR_DEPARTMENT_OTHER): Payer: Self-pay

## 2022-01-27 ENCOUNTER — Other Ambulatory Visit (HOSPITAL_BASED_OUTPATIENT_CLINIC_OR_DEPARTMENT_OTHER): Payer: Self-pay

## 2022-01-28 ENCOUNTER — Other Ambulatory Visit (HOSPITAL_BASED_OUTPATIENT_CLINIC_OR_DEPARTMENT_OTHER): Payer: Self-pay

## 2022-01-31 ENCOUNTER — Other Ambulatory Visit (HOSPITAL_BASED_OUTPATIENT_CLINIC_OR_DEPARTMENT_OTHER): Payer: Self-pay

## 2022-02-03 ENCOUNTER — Other Ambulatory Visit (HOSPITAL_BASED_OUTPATIENT_CLINIC_OR_DEPARTMENT_OTHER): Payer: Self-pay

## 2022-03-22 ENCOUNTER — Other Ambulatory Visit (HOSPITAL_BASED_OUTPATIENT_CLINIC_OR_DEPARTMENT_OTHER): Payer: Self-pay

## 2022-04-01 ENCOUNTER — Other Ambulatory Visit (HOSPITAL_BASED_OUTPATIENT_CLINIC_OR_DEPARTMENT_OTHER): Payer: Self-pay

## 2022-05-18 ENCOUNTER — Ambulatory Visit (INDEPENDENT_AMBULATORY_CARE_PROVIDER_SITE_OTHER): Payer: No Typology Code available for payment source | Admitting: Family Medicine

## 2022-05-18 ENCOUNTER — Encounter: Payer: Self-pay | Admitting: Family Medicine

## 2022-05-18 ENCOUNTER — Other Ambulatory Visit (HOSPITAL_BASED_OUTPATIENT_CLINIC_OR_DEPARTMENT_OTHER): Payer: Self-pay

## 2022-05-18 VITALS — BP 131/80 | HR 69 | Temp 98.6°F | Ht 73.0 in | Wt 225.0 lb

## 2022-05-18 DIAGNOSIS — Z23 Encounter for immunization: Secondary | ICD-10-CM

## 2022-05-18 DIAGNOSIS — Z125 Encounter for screening for malignant neoplasm of prostate: Secondary | ICD-10-CM

## 2022-05-18 DIAGNOSIS — E782 Mixed hyperlipidemia: Secondary | ICD-10-CM | POA: Diagnosis not present

## 2022-05-18 DIAGNOSIS — Z0000 Encounter for general adult medical examination without abnormal findings: Secondary | ICD-10-CM

## 2022-05-18 DIAGNOSIS — E663 Overweight: Secondary | ICD-10-CM

## 2022-05-18 DIAGNOSIS — R7309 Other abnormal glucose: Secondary | ICD-10-CM

## 2022-05-18 LAB — CBC
HCT: 46.2 % (ref 39.0–52.0)
Hemoglobin: 15.2 g/dL (ref 13.0–17.0)
MCHC: 32.8 g/dL (ref 30.0–36.0)
MCV: 86.7 fl (ref 78.0–100.0)
Platelets: 188 10*3/uL (ref 150.0–400.0)
RBC: 5.33 Mil/uL (ref 4.22–5.81)
RDW: 13.7 % (ref 11.5–15.5)
WBC: 3.5 10*3/uL — ABNORMAL LOW (ref 4.0–10.5)

## 2022-05-18 LAB — COMPREHENSIVE METABOLIC PANEL
ALT: 36 U/L (ref 0–53)
AST: 48 U/L — ABNORMAL HIGH (ref 0–37)
Albumin: 4.6 g/dL (ref 3.5–5.2)
Alkaline Phosphatase: 50 U/L (ref 39–117)
BUN: 15 mg/dL (ref 6–23)
CO2: 26 mEq/L (ref 19–32)
Calcium: 9.4 mg/dL (ref 8.4–10.5)
Chloride: 106 mEq/L (ref 96–112)
Creatinine, Ser: 1.04 mg/dL (ref 0.40–1.50)
GFR: 83.95 mL/min (ref >60.00)
Glucose, Bld: 91 mg/dL (ref 70–99)
Potassium: 4 mEq/L (ref 3.5–5.1)
Sodium: 140 mEq/L (ref 135–145)
Total Bilirubin: 0.7 mg/dL (ref 0.2–1.2)
Total Protein: 7.2 g/dL (ref 6.0–8.3)

## 2022-05-18 LAB — LIPID PANEL
Cholesterol: 194 mg/dL (ref 0–200)
HDL: 67.7 mg/dL (ref >39.00)
LDL Cholesterol: 102 mg/dL — ABNORMAL HIGH (ref 0–99)
NonHDL: 126.25
Total CHOL/HDL Ratio: 3
Triglycerides: 120 mg/dL (ref 0.0–149.0)
VLDL: 24 mg/dL (ref 0.0–40.0)

## 2022-05-18 LAB — HEMOGLOBIN A1C: Hgb A1c MFr Bld: 6.1 % (ref 4.6–6.5)

## 2022-05-18 LAB — PSA: PSA: 0.29 ng/mL (ref 0.10–4.00)

## 2022-05-18 LAB — TSH: TSH: 2.07 u[IU]/mL (ref 0.35–5.50)

## 2022-05-18 MED ORDER — EVOLOCUMAB 140 MG/ML ~~LOC~~ SOAJ
140.0000 mg | SUBCUTANEOUS | 11 refills | Status: DC
  Filled 2022-05-18 – 2022-06-03 (×2): qty 2, 28d supply, fill #0
  Filled 2022-08-27: qty 2, 28d supply, fill #1
  Filled 2022-11-02: qty 2, 28d supply, fill #2
  Filled 2023-01-21: qty 2, 28d supply, fill #3

## 2022-05-18 MED ORDER — FLUOCINONIDE 0.05 % EX CREA
1.0000 | TOPICAL_CREAM | Freq: Two times a day (BID) | CUTANEOUS | 1 refills | Status: DC
  Filled 2022-05-18: qty 30, 30d supply, fill #0
  Filled 2022-06-03: qty 60, 30d supply, fill #0
  Filled 2022-08-27: qty 60, 30d supply, fill #1

## 2022-05-18 NOTE — Patient Instructions (Addendum)
Return in about 1 year (around 05/20/2023) for cpe (20 min). And March- nurse visit to complete Shingrix series       Great to see you today.  I have refilled the medication(s) we provide.   If labs were collected, we will inform you of lab results once received either by echart message or telephone call.   - echart message- for normal results that have been seen by the patient already.   - telephone call: abnormal results or if patient has not viewed results in their echart.  Health Maintenance, Male Adopting a healthy lifestyle and getting preventive care are important in promoting health and wellness. Ask your health care provider about: The right schedule for you to have regular tests and exams. Things you can do on your own to prevent diseases and keep yourself healthy. What should I know about diet, weight, and exercise? Eat a healthy diet  Eat a diet that includes plenty of vegetables, fruits, low-fat dairy products, and lean protein. Do not eat a lot of foods that are high in solid fats, added sugars, or sodium. Maintain a healthy weight Body mass index (BMI) is a measurement that can be used to identify possible weight problems. It estimates body fat based on height and weight. Your health care provider can help determine your BMI and help you achieve or maintain a healthy weight. Get regular exercise Get regular exercise. This is one of the most important things you can do for your health. Most adults should: Exercise for at least 150 minutes each week. The exercise should increase your heart rate and make you sweat (moderate-intensity exercise). Do strengthening exercises at least twice a week. This is in addition to the moderate-intensity exercise. Spend less time sitting. Even light physical activity can be beneficial. Watch cholesterol and blood lipids Have your blood tested for lipids and cholesterol at 50 years of age, then have this test every 5 years. You may need  to have your cholesterol levels checked more often if: Your lipid or cholesterol levels are high. You are older than 50 years of age. You are at high risk for heart disease. What should I know about cancer screening? Many types of cancers can be detected early and may often be prevented. Depending on your health history and family history, you may need to have cancer screening at various ages. This may include screening for: Colorectal cancer. Prostate cancer. Skin cancer. Lung cancer. What should I know about heart disease, diabetes, and high blood pressure? Blood pressure and heart disease High blood pressure causes heart disease and increases the risk of stroke. This is more likely to develop in people who have high blood pressure readings or are overweight. Talk with your health care provider about your target blood pressure readings. Have your blood pressure checked: Every 3-5 years if you are 41-74 years of age. Every year if you are 69 years old or older. If you are between the ages of 19 and 34 and are a current or former smoker, ask your health care provider if you should have a one-time screening for abdominal aortic aneurysm (AAA). Diabetes Have regular diabetes screenings. This checks your fasting blood sugar level. Have the screening done: Once every three years after age 67 if you are at a normal weight and have a low risk for diabetes. More often and at a younger age if you are overweight or have a high risk for diabetes. What should I know about preventing infection? Hepatitis B  If you have a higher risk for hepatitis B, you should be screened for this virus. Talk with your health care provider to find out if you are at risk for hepatitis B infection. Hepatitis C Blood testing is recommended for: Everyone born from [birth year] through 1964. Anyone with known risk factors for hepatitis C. Sexually transmitted infections (STIs) You should be screened each year for STIs, including  gonorrhea and chlamydia, if: You are sexually active and are younger than 50 years of age. You are older than 50 years of age and your health care provider tells you that you are at risk for this type of infection. Your sexual activity has changed since you were last screened, and you are at increased risk for chlamydia or gonorrhea. Ask your health care provider if you are at risk. Ask your health care provider about whether you are at high risk for HIV. Your health care provider may recommend a prescription medicine to help prevent HIV infection. If you choose to take medicine to prevent HIV, you should first get tested for HIV. You should then be tested every 3 months for as long as you are taking the medicine. Follow these instructions at home: Alcohol use Do not drink alcohol if your health care provider tells you not to drink. If you drink alcohol: Limit how much you have to 0-2 drinks a day. Know how much alcohol is in your drink. In the [place] one drink equals one 12 oz bottle of beer (355 mL), one 5 oz glass of wine (148 mL), or one 1 oz glass of hard liquor (44 mL). Lifestyle Do not use any products that contain nicotine or tobacco. These products include cigarettes, chewing tobacco, and vaping devices, such as e-cigarettes. If you need help quitting, ask your health care provider. Do not use street drugs. Do not share needles. Ask your health care provider for help if you need support or information about quitting drugs. General instructions Schedule regular health, dental, and eye exams. Stay current with your vaccines. Tell your health care provider if: You often feel depressed. You have ever been abused or do not feel safe at home. Summary Adopting a healthy lifestyle and getting preventive care are important in promoting health and wellness. Follow your health care provider's instructions about healthy diet, exercising, and getting tested or screened for diseases. Follow your  health care provider's instructions on monitoring your cholesterol and blood pressure. This information is not intended to replace advice given to you by your health care provider. Make sure you discuss any questions you have with your health care provider. Document Revised: 10/12/2020 Document Reviewed: 10/12/2020 Elsevier Patient Education  [place].

## 2022-05-18 NOTE — Progress Notes (Signed)
Patient ID: Russell Ortiz, male  DOB: Jun 17, 1970, 51 y.o.   MRN: [id number] Patient Care Team    Relationship Specialty Notifications Start End  Ma Hillock, DO PCP - General Family Medicine  08/17/15   Pyrtle, Lajuan Lines, MD Consulting Physician Gastroenterology  04/22/19   Alda Berthold, [place] Physician Neurology  03/13/20     Chief Complaint  Patient presents with   Annual Exam    Pt is fasting     Subjective: Russell Ortiz is a 51 y.o. male present for CPE. All past medical history, surgical history, allergies, family history, immunizations, medications and social history were updated in the electronic medical record today. All recent labs, ED visits and hospitalizations within the last year were reviewed.  Health maintenance:  Colonoscopy: [name] with a P-uncle colon cancer at 53. Completed 06/01/2018, Dr. Hilarie Fredrickson 5 yr follow up. Due 2024. Immunizations:  tdap UTD 04/2018, influenza provided today . covid series completed. shingrix #1 provided today> nurse visit 3 months for 2nd to complete series Infectious disease screening: HIV and hep c screen completed PSA: no fhx.  Lab Results  Component Value Date   PSA 0.25 05/17/2021   PSA 0.20 04/22/2020   PSA 0.24 04/22/2019  , pt was counseled on prostate cancer screenings.  Patient has a Dental home. Hospitalizations/ED visits: reviewed     05/18/2022    8:06 AM 05/17/2021    9:56 AM 05/04/2021    3:52 PM 04/22/2020    8:59 AM 04/22/2019    9:32 AM  Depression screen PHQ 2/9  Decreased Interest 0 0 0 0 0  Down, Depressed, Hopeless 0 0 0 0 0  PHQ - 2 Score 0 0 0 0 0       No data to display                06/15/2020   10:00 AM 03/13/2020   11:13 AM 09/14/2017    8:26 AM 03/17/2016    8:51 AM  Fall Risk   Falls in the past year? 0 0 No No  Number falls in past yr: 0 0    Injury with Fall? 0 0      Immunization History  Administered Date(s) Administered   Influenza Split 03/29/2012    Influenza,inj,Quad PF,6+ Mos 03/17/2016, 04/13/2018, 04/22/2019, 04/22/2020, 05/17/2021, 05/18/2022   PFIZER(Purple Top)SARS-COV-2 Vaccination 09/07/2019, 10/02/2019, 05/06/2020   Td 08/15/2007   Tdap 04/13/2018   Zoster Recombinat (Shingrix) 05/18/2022   Past Medical History:  Diagnosis Date   Genital warts    Herpes zoster without complication 81/44/8185   Hyperlipidemia    Mass of left thigh 04/06/2018   PUD (peptic ulcer disease)    Allergies  Allergen Reactions   Statins Other (See Comments)    mylagia   Past Surgical History:  Procedure Laterality Date   VASECTOMY  2010   Family History  Problem Relation Age of Onset   Lung cancer Father    Asthma Brother    Stroke Brother    Diabetes Maternal Grandfather    Diabetes Maternal Uncle    Hyperlipidemia Maternal Grandmother    Colon cancer Paternal Uncle    Prostate cancer Paternal Uncle    Stomach cancer Neg Hx    Esophageal cancer Neg Hx    Colon polyps Neg Hx    Social History   Social History Narrative   Married. 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  Wears seat belt. Exercises 3x or greater a week.    Former Smoker. Quit 1999, Occasional ETOH, no recreational drugs.    Drinks caffeine beverages. Uses herbal remedies. Takes a multivitamin.    Smoker alarm in the home.    Feels safe in his relationships.    Right Handed   Lives in one story home       Allergies as of 05/18/2022       Reactions   Statins Other (See Comments)   mylagia        Medication List        Accurate as of May 18, 2022  8:31 AM. If you have any questions, ask your nurse or doctor.          STOP taking these medications    ibuprofen 800 MG tablet Commonly known as: ADVIL Stopped by: Howard Pouch, DO       TAKE these medications    Evolocumab 140 MG/ML Soaj Inject 140 mg into the skin every 14 (fourteen) days. What changed:  how much to take how to take  this when to take this Changed by: Howard Pouch, DO   fluocinonide cream 0.05 % Commonly known as: LIDEX Apply 1 Application topically 2 (two) times daily.   fluticasone 50 MCG/ACT nasal spray Commonly known as: FLONASE Place 2 sprays into both nostrils daily.       All past medical history, surgical history, allergies, family history, immunizations andmedications were updated in the EMR today and reviewed under the history and medication portions of their EMR.     No results found for this or any previous visit (from the past 2160 hour(s)).  No results found.  ROS 14 pt review of systems performed and negative (unless mentioned in an HPI)  Objective: BP 131/80   Pulse 69   Temp 98.6 F (37 C) (Oral)   Ht '6\' 1"'$  (1.854 m)   Wt 225 lb (102.1 kg)   SpO2 98%   BMI 29.69 kg/m  Physical Exam Constitutional:      General: He is not in acute distress.    Appearance: Normal appearance. He is not ill-appearing, toxic-appearing or diaphoretic.  HENT:     Head: Normocephalic and atraumatic.     Right Ear: Tympanic membrane, ear canal and external ear normal. There is no impacted cerumen.     Left Ear: Tympanic membrane, ear canal and external ear normal. There is no impacted cerumen.     Nose: Nose normal. No congestion or rhinorrhea.     Mouth/Throat:     Mouth: Mucous membranes are moist.     Pharynx: Oropharynx is clear. No oropharyngeal exudate or posterior oropharyngeal erythema.  Eyes:     General: No scleral icterus.       Right eye: No discharge.        Left eye: No discharge.     Extraocular Movements: Extraocular movements intact.     Pupils: Pupils are equal, round, and reactive to light.  Cardiovascular:     Rate and Rhythm: Normal rate and regular rhythm.     Pulses: Normal pulses.     Heart sounds: Normal heart sounds. No murmur heard.    No friction rub. No gallop.  Pulmonary:     Effort: Pulmonary effort is normal. No respiratory distress.     Breath  sounds: Normal breath sounds. No stridor. No wheezing, rhonchi or rales.  Chest:     Chest wall: No tenderness.  Abdominal:  General: Abdomen is flat. Bowel sounds are normal. There is no distension.     Palpations: Abdomen is soft. There is no mass.     Tenderness: There is no abdominal tenderness. There is no right CVA tenderness, left CVA tenderness, guarding or rebound.     Hernia: No hernia is present.  Musculoskeletal:        General: No swelling or tenderness. Normal range of motion.     Cervical back: Normal range of motion and neck supple.     Right lower leg: No edema.     Left lower leg: No edema.  Lymphadenopathy:     Cervical: No cervical adenopathy.  Skin:    General: Skin is warm and dry.     Coloration: Skin is not jaundiced.     Findings: No bruising, lesion or rash.  Neurological:     General: No focal deficit present.     Mental Status: He is alert and oriented to person, place, and time. Mental status is at baseline.     Cranial Nerves: No cranial nerve deficit.     Sensory: No sensory deficit.     Motor: No weakness.     Coordination: Coordination normal.     Gait: Gait normal.     Deep Tendon Reflexes: Reflexes normal.  Psychiatric:        Mood and Affect: Mood normal.        Behavior: Behavior normal.        Thought Content: Thought content normal.        Judgment: Judgment normal.      No results found.  Assessment/plan: JAVELLE DONIGAN is a 52 y.o. male present for CPE Hyperlipidemia, unspecified hyperlipidemia type/Overweight (BMI 25.0-29.9) - unable to tolerate statin -Continue repatha q 14 d injection.  -CBC, CMP, TSH and lipids collected today Elevated hemoglobin A1c A1c collected today Prostate cancer screening PSA collected today Routine general medical examination at a health care facility Patient was encouraged to exercise greater than 150 minutes a week. Patient was encouraged to choose a diet filled with fresh fruits and vegetables,  and lean meats. AVS provided to patient today for education/recommendation on gender specific health and safety maintenance. Colonoscopy: [name] with a P-uncle colon cancer at 81. Completed 06/01/2018, Dr. Hilarie Fredrickson 5 yr follow up. Due 2024. Immunizations:  tdap UTD 04/2018, influenza provided today . covid series completed. shingrix #1 provided today> nurse visit 3 months for 2nd to complete series Infectious disease screening: HIV and hep c screen completed PSA: no fhx.  Return in about 1 year (around 05/20/2023) for cpe (20 min).  Orders Placed This Encounter  Procedures   Flu Vaccine QUAD 6+ mos PF IM (Fluarix Quad PF)   Varicella-zoster vaccine IM   Comprehensive metabolic panel   CBC   Hemoglobin A1c   Lipid panel   TSH   PSA   Orders Placed This Encounter  Procedures   Flu Vaccine QUAD 6+ mos PF IM (Fluarix Quad PF)   Varicella-zoster vaccine IM   Comprehensive metabolic panel   CBC   Hemoglobin A1c   Lipid panel   TSH   PSA   Meds ordered this encounter  Medications   Evolocumab 140 MG/ML SOAJ    Sig: Inject 140 mg into the skin every 14 (fourteen) days.    Dispense:  2 mL    Refill:  11   fluocinonide cream (LIDEX) 0.05 %    Sig: Apply 1 Application topically 2 (two) times daily.  Dispense:  60 g    Refill:  1    Referral Orders  No referral(s) requested today     Note is dictated utilizing voice recognition software. Although note has been proof read prior to signing, occasional typographical errors still can be missed. If any questions arise, please do not hesitate to call for verification.  Electronically signed by: Howard Pouch, DO [place]

## 2022-05-19 ENCOUNTER — Other Ambulatory Visit (HOSPITAL_BASED_OUTPATIENT_CLINIC_OR_DEPARTMENT_OTHER): Payer: Self-pay

## 2022-05-27 ENCOUNTER — Other Ambulatory Visit (HOSPITAL_BASED_OUTPATIENT_CLINIC_OR_DEPARTMENT_OTHER): Payer: Self-pay

## 2022-06-03 ENCOUNTER — Other Ambulatory Visit (HOSPITAL_BASED_OUTPATIENT_CLINIC_OR_DEPARTMENT_OTHER): Payer: Self-pay

## 2022-06-10 ENCOUNTER — Telehealth: Payer: 59 | Admitting: Physician Assistant

## 2022-06-10 DIAGNOSIS — B029 Zoster without complications: Secondary | ICD-10-CM

## 2022-06-10 MED ORDER — VALACYCLOVIR HCL 1 G PO TABS: 1000.0000 mg | ORAL_TABLET | Freq: Two times a day (BID) | ORAL | 0 refills | Status: AC

## 2022-06-10 MED ORDER — GABAPENTIN 100 MG PO CAPS: 100.0000 mg | ORAL_CAPSULE | Freq: Three times a day (TID) | ORAL | 0 refills | Status: DC | PRN

## 2022-06-10 NOTE — Progress Notes (Signed)
Virtual Visit Consent   Russell Ortiz, you are scheduled for a virtual visit with a [facility] provider today. Just as with appointments in the office, your consent must be obtained to participate. Your consent will be active for this visit and any virtual visit you may have with one of our providers in the next 365 days. If you have a MyChart account, a copy of this consent can be sent to you electronically.  As this is a virtual visit, video technology does not allow for your provider to perform a traditional examination. This may limit your provider's ability to fully assess your condition. If your provider identifies any concerns that need to be evaluated in person or the need to arrange testing (such as labs, EKG, etc.), we will make arrangements to do so. Although advances in technology are sophisticated, we cannot ensure that it will always work on either your end or our end. If the connection with a video visit is poor, the visit may have to be switched to a telephone visit. With either a video or telephone visit, we are not always able to ensure that we have a secure connection.  By engaging in this virtual visit, you consent to the provision of healthcare and authorize for your insurance to be billed (if applicable) for the services provided during this visit. Depending on your insurance coverage, you may receive a charge related to this service.  I need to obtain your verbal consent now. Are you willing to proceed with your visit today? [facility] has provided verbal consent on 06/10/2022 for a virtual visit (video or telephone). Mar Daring, PA-C  Date: 06/10/2022 11:35 AM  Virtual Visit via Video Note   I, Mar Daring, connected with  Russell Ortiz  ([id number], Sep 08, 1970) on 06/10/22 at 11:30 AM EST by a video-enabled telemedicine application and verified that I am speaking with the correct person using two identifiers.  Location: Patient: Virtual Visit Location Patient:  Mobile Provider: Virtual Visit Location Provider: Home Office   I discussed the limitations of evaluation and management by telemedicine and the availability of in person appointments. The patient expressed understanding and agreed to proceed.    History of Present Illness: Russell Ortiz is a 52 y.o. who identifies as a male who was assigned male at birth, and is being seen today for possible shingles. Had an episode in Nov 2022. Reports this feels exactly the same. Last night noticed itching on the left side of his scalp above his left ear. Today has progressed to a burning and more discomfort. Did get his shingles vaccine at his PCP office on 05/18/22. Currently no rash or bumps present. Does have seborrheic dermatitis of his scalp as well, but reports this feels more consistent to previous shingles outbreak.   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   Cervical radiculopathy 04/20/2018   Neutropenia ([place]) 04/12/2017   Overweight (BMI 25.0-29.9) 11/28/2016   Elevated hemoglobin A1c 08/25/2016   Elevated liver function tests 03/12/2015   Hyperlipemia 03/12/2015    Allergies:  Allergies  Allergen Reactions   Statins Other (See Comments)    mylagia   Medications:  Current Outpatient Medications:    Evolocumab 140 MG/ML SOAJ, Inject 140 mg into the skin every 14 (fourteen) days., Disp: 2 mL, Rfl: 11   fluocinonide cream (LIDEX) 9.02 %, Apply 1 Application topically 2 (two) times daily., Disp: 60 g,  Rfl: 1   fluticasone (FLONASE) 50 MCG/ACT nasal spray, Place 2 sprays into both nostrils daily., Disp: 16 g, Rfl: 6   gabapentin (NEURONTIN) 100 MG capsule, Take 1 capsule (100 mg total) by mouth 3 (three) times daily as needed., Disp: 30 capsule, Rfl: 0   valACYclovir (VALTREX) 1000 MG tablet, Take 1 tablet (1,000 mg total) by mouth 2 (two) times daily for 7 days., Disp: 14 tablet, Rfl: 0  Observations/Objective: Patient is  well-developed, well-nourished in no acute distress.  Resting comfortably Head is normocephalic, atraumatic.  No labored breathing.  Speech is clear and coherent with logical content.  Patient is alert and oriented at baseline.    Assessment and Plan: 1. Herpes zoster without complication - valACYclovir (VALTREX) 1000 MG tablet; Take 1 tablet (1,000 mg total) by mouth 2 (two) times daily for 7 days.  Dispense: 14 tablet; Refill: 0 - gabapentin (NEURONTIN) 100 MG capsule; Take 1 capsule (100 mg total) by mouth 3 (three) times daily as needed.  Dispense: 30 capsule; Refill: 0  - Suspected shingles - Valtrex and Gabapentin prescribed - Cool compresses - Avoid scratching to prevent secondary infection - Follow up in person if continues to worsen or fails to improve with treatment  Follow Up Instructions: I discussed the assessment and treatment plan with the patient. The patient was provided an opportunity to ask questions and all were answered. The patient agreed with the plan and demonstrated an understanding of the instructions.  A copy of instructions were sent to the patient via MyChart unless otherwise noted below.    The patient was advised to call back or seek an in-person evaluation if the symptoms worsen or if the condition fails to improve as anticipated.  Time:  I spent 8 minutes with the patient via telehealth technology discussing the above problems/concerns.    Mar Daring, PA-C

## 2022-06-10 NOTE — Patient Instructions (Signed)
[name], thank you for joining Mar Daring, PA-C for today's virtual visit.  While this provider is not your primary care provider (PCP), if your PCP is located in our provider database this encounter information will be shared with them immediately following your visit.   [facility] account gives you access to today's visit and all your visits, tests, and labs performed at [facility] " click here if you don't have a [facility] account or go to [email or web address]  Consent: (Patient) Devonta D Gironda provided verbal consent for this virtual visit at the beginning of the encounter.  Current Medications:  Current Outpatient Medications:    gabapentin (NEURONTIN) 100 MG capsule, Take 1 capsule (100 mg total) by mouth 3 (three) times daily as needed., Disp: 30 capsule, Rfl: 0   valACYclovir (VALTREX) 1000 MG tablet, Take 1 tablet (1,000 mg total) by mouth 2 (two) times daily for 7 days., Disp: 14 tablet, Rfl: 0   Evolocumab 140 MG/ML SOAJ, Inject 140 mg into the skin every 14 (fourteen) days., Disp: 2 mL, Rfl: 11   fluocinonide cream (LIDEX) 1.61 %, Apply 1 Application topically 2 (two) times daily., Disp: 60 g, Rfl: 1   fluticasone (FLONASE) 50 MCG/ACT nasal spray, Place 2 sprays into both nostrils daily., Disp: 16 g, Rfl: 6   Medications ordered in this encounter:  Meds ordered this encounter  Medications   valACYclovir (VALTREX) 1000 MG tablet    Sig: Take 1 tablet (1,000 mg total) by mouth 2 (two) times daily for 7 days.    Dispense:  14 tablet    Refill:  0    Order Specific Question:   Supervising Provider    Answer:   Chase Picket [[id number]]   gabapentin (NEURONTIN) 100 MG capsule    Sig: Take 1 capsule (100 mg total) by mouth 3 (three) times daily as needed.    Dispense:  30 capsule    Refill:  0    Order Specific Question:   Supervising Provider    Answer:   Chase Picket [id number]     *If you need refills on other  medications prior to your next appointment, please contact your pharmacy*  Follow-Up: Call back or seek an in-person evaluation if the symptoms worsen or if the condition fails to improve as anticipated.  [place] [phone]  Other Instructions  Shingles  Shingles, which is also known as herpes zoster, is an infection that causes a painful skin rash and fluid-filled blisters. It is caused by a virus. Shingles only develops in people who: Have had chickenpox. Have been vaccinated against chickenpox. Shingles is rare in this group. What are the causes? Shingles is caused by varicella-zoster virus. This is the same virus that causes chickenpox. After a person is exposed to the virus, it stays in the body in an inactive (dormant) state. Shingles develops if the virus is reactivated. This can happen many years after the first (initial) exposure to the virus. It is not known what causes this virus to be reactivated. What increases the risk? People who have had chickenpox or received the chickenpox vaccine are at risk for shingles. Shingles infection is more common in people who: Are older than 51 years of age. Have a weakened disease-fighting system (immune system), such as people with: HIV (human immunodeficiency virus). AIDS (acquired immunodeficiency syndrome). Cancer. Are taking medicines that weaken the immune system, such as organ transplant medicines. Are experiencing a lot of  stress. What are the signs or symptoms? Early symptoms of this condition include itching, tingling, and pain in an area on your skin. Pain may be described as burning, stabbing, or throbbing. A few days or weeks after early symptoms start, a painful red rash appears. The rash is usually on one side of the body and has a band-like or belt-like pattern. The rash eventually turns into fluid-filled blisters that break open, change into scabs, and dry up in about 2-3 weeks. At any time during the  infection, you may also develop: A fever. Chills. A headache. Nausea. How is this diagnosed? This condition is diagnosed with a skin exam. Skin or fluid samples (a culture) may be taken from the blisters before a diagnosis is made. How is this treated? The rash may last for several weeks. There is not a specific cure for this condition. Your health care provider may prescribe medicines to help you manage pain, recover more quickly, and avoid long-term problems. Medicines may include: Antiviral medicines. Anti-inflammatory medicines. Pain medicines. Anti-itching medicines (antihistamines). If the area involved is on your face, you may be referred to a specialist, such as an eye doctor (ophthalmologist) or an ear, nose, and throat (ENT) doctor (otorhinolaryngologist) to help you avoid eye problems, chronic pain, or disability. Follow these instructions at home: Medicines Take over-the-counter and prescription medicines only as told by your health care provider. Apply an anti-itch cream or numbing cream to the affected area as told by your health care provider. Relieving itching and discomfort  Apply cold, wet cloths (cold compresses) to the area of the rash or blisters as told by your health care provider. Cool baths can be soothing. Try adding baking soda or dry oatmeal to the water to reduce itching. Do not bathe in hot water. Use calamine lotion as recommended by your health care provider. This is an over-the-counter lotion that helps to relieve itchiness. Blister and rash care Keep your rash covered with a loose bandage (dressing). Wear loose-fitting clothing to help ease the pain of material rubbing against the rash. Wash your hands with soap and water for at least 20 seconds before and after you change your dressing. If soap and water are not available, use hand sanitizer. Change your dressing as told by your health care provider. Keep your rash and blisters clean by washing the area  with mild soap and cool water as told by your health care provider. Check your rash every day for signs of infection. Check for: More redness, swelling, or pain. Fluid or blood. Warmth. Pus or a bad smell. Do not scratch your rash or pick at your blisters. To help avoid scratching: Keep your fingernails clean and cut short. Wear gloves or mittens while you sleep, if scratching is a problem. General instructions Rest as told by your health care provider. Wash your hands often with soap and water for at least 20 seconds. If soap and water are not available, use hand sanitizer. Doing this lowers your chance of getting a bacterial skin infection. Before your blisters change into scabs, your shingles infection can cause chickenpox in people who have never had it or have never been vaccinated against it. To prevent this from happening, avoid contact with other people, especially: Babies. Pregnant women. Children who have eczema. Older people who have transplants. People who have chronic illnesses, such as cancer or AIDS. Keep all follow-up visits. This is important. How is this prevented? Getting vaccinated is the best way to prevent shingles and protect  against shingles complications. If you have not been vaccinated, talk with your health care provider about getting the vaccine. Where to find more information Centers for Disease Control and Prevention: http://www.wolf.info/ Contact a health care provider if: Your pain is not relieved with prescribed medicines. Your pain does not get better after the rash heals. You have any of these signs of infection: More redness, swelling, or pain around the rash. Fluid or blood coming from the rash. Warmth coming from your rash. Pus or a bad smell coming from the rash. A fever. Get help right away if: The rash is on your face or nose. You have facial pain, pain around your eye area, or loss of feeling on one side of your face. You have difficulty seeing. You  have ear pain or have ringing in your ear. You have a loss of taste. Your condition gets worse. Summary Shingles, also known as herpes zoster, is an infection that causes a painful skin rash and fluid-filled blisters. This condition is diagnosed with a skin exam. Skin or fluid samples (a culture) may be taken from the blisters. Keep your rash covered with a loose bandage (dressing). Wear loose-fitting clothing to help ease the pain of material rubbing against the rash. Before your blisters change into scabs, your shingles infection can cause chickenpox in people who have never had it or have never been vaccinated against it. This information is not intended to replace advice given to you by your health care provider. Make sure you discuss any questions you have with your health care provider. Document Revised: 05/18/2020 Document Reviewed: 05/18/2020 Elsevier Patient Education  [place].    If you have been instructed to have an in-person evaluation today at a local Urgent Care facility, please use the link below. It will take you to a list of all of our available [facility], including address, phone number and hours of operation. Please do not delay care.  [facility]  If you or a family member do not have a primary care provider, use the link below to schedule a visit and establish care. When you choose a [facility] primary care physician or advanced practice provider, you gain a long-term partner in health. Find a Primary Care Provider  Learn more about [facility]'s in-office and virtual care options: [facility]

## 2022-06-21 ENCOUNTER — Ambulatory Visit (INDEPENDENT_AMBULATORY_CARE_PROVIDER_SITE_OTHER): Payer: 59 | Admitting: Family Medicine

## 2022-06-21 ENCOUNTER — Encounter: Payer: Self-pay | Admitting: Family Medicine

## 2022-06-21 VITALS — BP 132/86 | HR 83 | Temp 99.2°F | Ht 73.0 in | Wt 225.0 lb

## 2022-06-21 DIAGNOSIS — J988 Other specified respiratory disorders: Secondary | ICD-10-CM

## 2022-06-21 DIAGNOSIS — R051 Acute cough: Secondary | ICD-10-CM

## 2022-06-21 DIAGNOSIS — B9789 Other viral agents as the cause of diseases classified elsewhere: Secondary | ICD-10-CM | POA: Diagnosis not present

## 2022-06-21 LAB — POC COVID19 BINAXNOW: SARS Coronavirus 2 Ag: NEGATIVE

## 2022-06-21 MED ORDER — DOXYCYCLINE HYCLATE 100 MG PO TABS: 100.0000 mg | ORAL_TABLET | Freq: Two times a day (BID) | ORAL | 0 refills | Status: DC

## 2022-06-21 NOTE — Progress Notes (Signed)
Russell Ortiz , 1971-03-22, 52 y.o., male MRN: [id number] Patient Care Team    Relationship Specialty Notifications Start End  Ma Hillock, DO PCP - General Family Medicine  08/17/15   Pyrtle, Lajuan Lines, MD Consulting Physician Gastroenterology  04/22/19   Alda Berthold, [place] Physician Neurology  03/13/20     Chief Complaint  Patient presents with   Cough    Pt c/o cough, nasal congestion, HA, sinus pain, HA x 1 day     Subjective: Pt presents for an OV with complaints of cough, nasal congestion, headache, sinus pain and chest congestion of 1 day duration.  Associated symptoms include fatigue and myalgias.  Pt has tried mucinex to ease their symptoms.      05/18/2022    8:06 AM 05/17/2021    9:56 AM 05/04/2021    3:52 PM 04/22/2020    8:59 AM 04/22/2019    9:32 AM  Depression screen PHQ 2/9  Decreased Interest 0 0 0 0 0  Down, Depressed, Hopeless 0 0 0 0 0  PHQ - 2 Score 0 0 0 0 0    Allergies  Allergen Reactions   Statins Other (See Comments)    mylagia   Social History   Social History Narrative   Married. 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    Wears seat belt. Exercises 3x or greater a week.    Former Smoker. Quit 1999, Occasional ETOH, no recreational drugs.    Drinks caffeine beverages. Uses herbal remedies. Takes a multivitamin.    Smoker alarm in the home.    Feels safe in his relationships.    Right Handed   Lives in one story home      Past Medical History:  Diagnosis Date   Genital warts    Herpes zoster without complication 11/30/9483   Hyperlipidemia    Mass of left thigh 04/06/2018   PUD (peptic ulcer disease)    Past Surgical History:  Procedure Laterality Date   VASECTOMY  2010   Family History  Problem Relation Age of Onset   Lung cancer Father    Asthma Brother    Stroke Brother    Diabetes Maternal Grandfather    Diabetes Maternal Uncle    Hyperlipidemia Maternal  Grandmother    Colon cancer Paternal Uncle    Prostate cancer Paternal Uncle    Stomach cancer Neg Hx    Esophageal cancer Neg Hx    Colon polyps Neg Hx    Allergies as of 06/21/2022       Reactions   Statins Other (See Comments)   mylagia        Medication List        Accurate as of June 21, 2022 12:01 PM. If you have any questions, ask your nurse or doctor.          doxycycline 100 MG tablet Commonly known as: VIBRA-TABS Take 1 tablet (100 mg total) by mouth 2 (two) times daily. Started by: Howard Pouch, DO   fluocinonide cream 0.05 % Commonly known as: LIDEX Apply 1 Application topically 2 (two) times daily.   fluticasone 50 MCG/ACT nasal spray Commonly known as: FLONASE Place 2 sprays into both nostrils daily.   gabapentin 100 MG capsule Commonly known as: NEURONTIN Take 1 capsule (100 mg total) by mouth 3 (three) times daily as needed.   Repatha SureClick 462 MG/ML Soaj Generic drug: Evolocumab  Inject 140 mg into the skin every 14 (fourteen) days.        All past medical history, surgical history, allergies, family history, immunizations andmedications were updated in the EMR today and reviewed under the history and medication portions of their EMR.     Review of Systems  Constitutional:  Positive for fever and malaise/fatigue. Negative for chills.  HENT:  Positive for congestion and sinus pain. Negative for ear discharge, ear pain, nosebleeds and sore throat.   Respiratory:  Positive for cough and sputum production. Negative for shortness of breath and wheezing.   Gastrointestinal:  Negative for abdominal pain, diarrhea, nausea and vomiting.  Musculoskeletal:  Positive for myalgias.  Skin:  Negative for rash.  Neurological:  Positive for headaches. Negative for dizziness.   Negative, with the exception of above mentioned in HPI   Objective:  BP 132/86   Pulse 83   Temp 99.2 F (37.3 C) (Oral)   Ht '6\' 1"'$  (1.854 m)   Wt 225 lb (102.1 kg)    SpO2 97%   BMI 29.69 kg/m  Body mass index is 29.69 kg/m. Physical Exam Vitals and nursing note reviewed.  Constitutional:      General: He is not in acute distress.    Appearance: Normal appearance. He is not ill-appearing, toxic-appearing or diaphoretic.  HENT:     Head: Normocephalic and atraumatic.     Right Ear: Tympanic membrane and ear canal normal. There is no impacted cerumen.     Left Ear: Tympanic membrane and ear canal normal. There is no impacted cerumen.     Nose: No congestion or rhinorrhea.     Mouth/Throat:     Pharynx: No oropharyngeal exudate or posterior oropharyngeal erythema.  Eyes:     General: No scleral icterus.       Right eye: No discharge.        Left eye: No discharge.     Extraocular Movements: Extraocular movements intact.     Pupils: Pupils are equal, round, and reactive to light.  Cardiovascular:     Rate and Rhythm: Normal rate and regular rhythm.  Pulmonary:     Effort: Pulmonary effort is normal. No respiratory distress.     Breath sounds: Normal breath sounds. No wheezing, rhonchi or rales.  Skin:    General: Skin is warm.     Findings: No rash.  Neurological:     Mental Status: He is alert and oriented to person, place, and time. Mental status is at baseline.  Psychiatric:        Mood and Affect: Mood normal.        Behavior: Behavior normal.        Thought Content: Thought content normal.        Judgment: Judgment normal.     No results found. No results found. Results for orders placed or performed in visit on 06/21/22 (from the past 24 hour(s))  POC COVID-19 BinaxNow     Status: Normal   Collection Time: 06/21/22 11:32 AM  Result Value Ref Range   SARS Coronavirus 2 Ag Negative Negative    Assessment/Plan: [name] is a 52 y.o. male present for OV for  Viral respiratory infection/Acute cough Rest, hydrate.  Encouraged flonase, mucinex (DM if cough), nettie pot or nasal saline.  Printed doxy BID script for him, with  instructions to only start if symptoms are not improving or worsening after Friday. If cough present it can last up to 6-8 weeks.  F/U  2 weeks if not improved.   Reviewed expectations re: course of current medical issues. Discussed self-management of symptoms. Outlined signs and symptoms indicating need for more acute intervention. Patient verbalized understanding and all questions were answered. Patient received an After-Visit Summary.    Orders Placed This Encounter  Procedures   POC COVID-19 BinaxNow   Meds ordered this encounter  Medications   doxycycline (VIBRA-TABS) 100 MG tablet    Sig: Take 1 tablet (100 mg total) by mouth 2 (two) times daily.    Dispense:  20 tablet    Refill:  0   Referral Orders  No referral(s) requested today     Note is dictated utilizing voice recognition software. Although note has been proof read prior to signing, occasional typographical errors still can be missed. If any questions arise, please do not hesitate to call for verification.   electronically signed by:  Howard Pouch, DO  [place]

## 2022-06-21 NOTE — Patient Instructions (Signed)
Return if symptoms worsen or fail to improve.        Great to see you today.  Covid test is negative today

## 2022-06-25 ENCOUNTER — Ambulatory Visit
Admission: RE | Admit: 2022-06-25 | Discharge: 2022-06-25 | Disposition: A | Discharge status: Home / Self Care | Payer: 59 | Source: Ambulatory Visit | Attending: Family Medicine | Admitting: Family Medicine

## 2022-06-25 ENCOUNTER — Ambulatory Visit (INDEPENDENT_AMBULATORY_CARE_PROVIDER_SITE_OTHER): Payer: 59

## 2022-06-25 ENCOUNTER — Ambulatory Visit: Payer: 59

## 2022-06-25 VITALS — BP 129/85 | HR 76 | Temp 98.0°F | Resp 16

## 2022-06-25 DIAGNOSIS — J069 Acute upper respiratory infection, unspecified: Secondary | ICD-10-CM

## 2022-06-25 DIAGNOSIS — R0602 Shortness of breath: Secondary | ICD-10-CM | POA: Diagnosis not present

## 2022-06-25 MED ORDER — MECLIZINE HCL 25 MG PO TABS: 25.0000 mg | ORAL_TABLET | Freq: Three times a day (TID) | ORAL | 0 refills | Status: DC | PRN

## 2022-06-25 NOTE — ED Triage Notes (Addendum)
C/O cough onset approx 5 days ago; states was seen by PCP 4 days ago and was given Rx for doxycycline & told to start 12/19 if no improvement; pt states he indeed started abx yesterday. Pt states he is now getting "winded" with any exertion, along with some dizziness, HA and chest heaviness now. Denies n/v, fevers. Has also been taking Mucinex. SO reports negative Covid test at PCP office; office was unable to test for influenza.

## 2022-06-25 NOTE — Discharge Instructions (Addendum)
Continue doxycycline. Continue plain guaifenesin (600 to '1200mg'$  extended release tabs such as Mucinex) twice daily, with plenty of water, for cough and congestion.  May add Pseudoephedrine ('30mg'$ , one or two every 4 to 6 hours) for sinus congestion.  Get adequate rest.   If needed may use Afrin nasal spray (or generic oxymetazoline) each morning for about 5 days and then discontinue.  Also recommend using saline nasal spray several times daily and saline nasal irrigation (AYR is a common brand).    May take Delsym Cough Suppressant ("12 Hour Cough Relief") at bedtime for nighttime cough.  May add Antivert (meclizine) if needed for dizziness. If symptoms become significantly worse during the night or over the weekend, proceed to the local emergency room.

## 2022-06-25 NOTE — ED Provider Notes (Signed)
[facility]    CSN: [id number] Arrival date & time: 06/25/22  1059      History   Chief Complaint Chief Complaint  Patient presents with   Cough   Shortness of Breath    HPI Russell Ortiz is a 52 y.o. male.   Six days ago patient developed URI symptoms including productive cough, sinus congestion, mild sore throat, chills/sweats, myalgias, and fatigue.  Patient visited his PCP 4 days ago where a COVID test was negative (office unable to test for influenza).  Patient was given Rx for doxycycline to hold, and advised to begin yesterday if he did not improve. Patient has now developed a sense of chest heaviness and mild shortness of breath with activity.  He denies pleuritic pain. He has a history of seasonal rhinitis, and is a past smoker.  The history is provided by the patient and the spouse.    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 Authorizing Provider  doxycycline (VIBRA-TABS) 100 MG tablet Take 1 tablet (100 mg total) by mouth 2 (two) times daily. 06/21/22  Yes Kuneff, Renee A, DO  Evolocumab 140 MG/ML SOAJ Inject 140 mg into the skin every 14 (fourteen) days. 05/18/22  Yes Kuneff, Renee A, DO  fluocinonide cream (LIDEX) 9.37 % Apply 1 Application topically 2 (two) times daily. 05/18/22  Yes Kuneff, Renee A, DO  fluticasone  (FLONASE) 50 MCG/ACT nasal spray Place 2 sprays into both nostrils daily. 05/17/21  Yes Kuneff, Renee A, DO  gabapentin (NEURONTIN) 100 MG capsule Take 1 capsule (100 mg total) by mouth 3 (three) times daily as needed. 06/10/22  Yes Mar Daring, PA-C  meclizine (ANTIVERT) 25 MG tablet Take 1 tablet (25 mg total) by mouth 3 (three) times daily as needed for dizziness. 06/25/22  Yes Kandra Nicolas, MD  metoprolol tartrate (LOPRESSOR) 100 MG tablet Take one tablet by mouth two hours prior to CT 03/12/20 04/22/20  Josue Hector, MD    Family History Family History  Problem Relation Age of Onset   Lung cancer Father    Asthma Brother    Stroke Brother    Diabetes Maternal Grandfather    Diabetes Maternal Uncle    Hyperlipidemia Maternal Grandmother    Colon cancer Paternal Uncle    Prostate cancer Paternal Uncle    Stomach cancer Neg Hx    Esophageal cancer Neg Hx    Colon polyps Neg Hx     Social History Social History   Tobacco Use   Smoking status: Former    Types: Cigarettes    Quit date: 06/06/1997    Years since quitting: 25.0   Smokeless tobacco: Never  Vaping Use   Vaping Use: Never used  Substance Use Topics   Alcohol use: Not Currently  Comment: socially   Drug use: No     Allergies   Statins   Review of Systems Review of Systems + sore throat + cough No pleuritic pain No wheezing + nasal congestion + post-nasal drainage No sinus pain/pressure No itchy/red eyes No earache No hemoptysis + SOB ? fever, + chills/sweats No nausea No vomiting No abdominal pain No diarrhea No urinary symptoms No skin rash + fatigue + myalgias + headache Used OTC meds (Mucinex, Tylenol) without relief   Physical Exam Triage Vital Signs ED Triage Vitals  Enc Vitals Group     BP 06/25/22 1109 129/85     Pulse Rate 06/25/22 1109 76     Resp 06/25/22 1109 16     Temp 06/25/22 1109 98 F (36.7 C)     Temp Source 06/25/22 1109 Oral     SpO2 06/25/22 1109  94 %     Weight --      Height --      Head Circumference --      Peak Flow --      Pain Score 06/25/22 1114 5     Pain Loc --      Pain Edu? --      Excl. in [place]? --    No data found.  Updated Vital Signs BP 129/85 (BP Location: Right Arm)   Pulse 76   Temp 98 F (36.7 C) (Oral)   Resp 16   SpO2 94%   Visual Acuity Right Eye Distance:   Left Eye Distance:   Bilateral Distance:    Right Eye Near:   Left Eye Near:    Bilateral Near:     Physical Exam Nursing notes and Vital Signs reviewed. Appearance:  Patient appears stated age, and in no acute distress Eyes:  Pupils are equal, round, and reactive to light and accomodation.  Extraocular movement is intact.  Conjunctivae are not inflamed  Ears:  Canals normal.  Tympanic membranes normal.  Nose:  Mildly congested turbinates.  No sinus tenderness.   Pharynx:  Normal Neck:  Supple.  Mildly enlarged lateral nodes are present, tender to palpation on the left.   Lungs:  Faint wheeze left upper chest posteriorly.  Breath sounds are equal.  Moving air well. Heart:  Regular rate and rhythm without murmurs, rubs, or gallops.  Abdomen:  Nontender without masses or hepatosplenomegaly.  Bowel sounds are present.  No CVA or flank tenderness.  Extremities:  No edema.  Skin:  No rash present.   UC Treatments / Results  Labs (all labs ordered are listed, but only abnormal results are displayed) Labs Reviewed - No data to display  EKG   Radiology DG Chest 2 View  Result Date: 06/25/2022 CLINICAL DATA:  Of respiratory infection for 6 days. Increasing shortness of breath. EXAM: CHEST - 2 VIEW COMPARISON:  Chest two views 03/09/2020 FINDINGS: Cardiac silhouette and mediastinal contours are within normal limits. The lungs are clear. No pleural effusion or pneumothorax. No acute skeletal abnormality. IMPRESSION: No active cardiopulmonary disease. Electronically Signed   By: Yvonne Kendall M.D.   On: 06/25/2022 11:47     Procedures Procedures (including critical care time)  Medications Ordered in UC Medications - No data to display  Initial Impression / Assessment and Plan / UC Course  I have reviewed the triage vital signs and the nursing notes.  Pertinent labs & imaging results that were available during my care of the patient were reviewed by me and considered in my medical  decision making (see chart for details).    Benign exam and negative chest x-ray reassuring. Advised patient to continue doxycycline and symptomatic treatment. Because of complaint of intermittent vertigo-like symptoms, rx writtent for Antivert '25mg'$  TID PRN. Followup with Family Doctor if not improved in about6 days.  Final Clinical Impressions(s) / UC Diagnoses   Final diagnoses:  Viral URI with cough     Discharge Instructions      Continue doxycycline. Continue plain guaifenesin (600 to '1200mg'$  extended release tabs such as Mucinex) twice daily, with plenty of water, for cough and congestion.  May add Pseudoephedrine ('30mg'$ , one or two every 4 to 6 hours) for sinus congestion.  Get adequate rest.   If needed may use Afrin nasal spray (or generic oxymetazoline) each morning for about 5 days and then discontinue.  Also recommend using saline nasal spray several times daily and saline nasal irrigation (AYR is a common brand).    May take Delsym Cough Suppressant ("12 Hour Cough Relief") at bedtime for nighttime cough.  May add Antivert (meclizine) if needed for dizziness. If symptoms become significantly worse during the night or over the weekend, proceed to the local emergency room.      ED Prescriptions     Medication Sig Dispense Auth. Provider   meclizine (ANTIVERT) 25 MG tablet Take 1 tablet (25 mg total) by mouth 3 (three) times daily as needed for dizziness. 15 tablet Kandra Nicolas, MD         Kandra Nicolas, MD 06/25/22 1224

## 2022-06-26 ENCOUNTER — Telehealth: Payer: Self-pay

## 2022-08-17 ENCOUNTER — Ambulatory Visit (INDEPENDENT_AMBULATORY_CARE_PROVIDER_SITE_OTHER): Payer: 59

## 2022-08-17 DIAGNOSIS — Z23 Encounter for immunization: Secondary | ICD-10-CM | POA: Diagnosis not present

## 2022-08-27 ENCOUNTER — Other Ambulatory Visit: Payer: Self-pay | Admitting: Family Medicine

## 2022-08-27 ENCOUNTER — Other Ambulatory Visit (HOSPITAL_BASED_OUTPATIENT_CLINIC_OR_DEPARTMENT_OTHER): Payer: Self-pay

## 2022-08-27 DIAGNOSIS — B9789 Other viral agents as the cause of diseases classified elsewhere: Secondary | ICD-10-CM

## 2022-08-28 ENCOUNTER — Other Ambulatory Visit (HOSPITAL_BASED_OUTPATIENT_CLINIC_OR_DEPARTMENT_OTHER): Payer: Self-pay

## 2022-08-29 ENCOUNTER — Other Ambulatory Visit (HOSPITAL_BASED_OUTPATIENT_CLINIC_OR_DEPARTMENT_OTHER): Payer: Self-pay

## 2022-08-29 MED ORDER — FLUTICASONE PROPIONATE 50 MCG/ACT NA SUSP
2.0000 | Freq: Every day | NASAL | 3 refills | Status: DC
  Filled 2022-08-29: qty 16, 30d supply, fill #0
  Filled 2023-01-21: qty 16, 30d supply, fill #1
  Filled 2023-05-28: qty 16, 30d supply, fill #2

## 2022-08-29 NOTE — Telephone Encounter (Signed)
Refill sent to requested pharmacy.

## 2022-08-30 ENCOUNTER — Other Ambulatory Visit (HOSPITAL_BASED_OUTPATIENT_CLINIC_OR_DEPARTMENT_OTHER): Payer: Self-pay

## 2022-12-02 ENCOUNTER — Telehealth (INDEPENDENT_AMBULATORY_CARE_PROVIDER_SITE_OTHER): Payer: 59 | Admitting: Family Medicine

## 2022-12-02 ENCOUNTER — Encounter: Payer: Self-pay | Admitting: Family Medicine

## 2022-12-02 ENCOUNTER — Other Ambulatory Visit (HOSPITAL_BASED_OUTPATIENT_CLINIC_OR_DEPARTMENT_OTHER): Payer: Self-pay

## 2022-12-02 DIAGNOSIS — L740 Miliaria rubra: Secondary | ICD-10-CM | POA: Diagnosis not present

## 2022-12-02 MED ORDER — FLUOCINONIDE 0.05 % EX CREA
1.0000 | TOPICAL_CREAM | Freq: Two times a day (BID) | CUTANEOUS | 1 refills | Status: AC | PRN
  Filled 2022-12-02: qty 60, 30d supply, fill #0

## 2022-12-02 NOTE — Progress Notes (Signed)
VIRTUAL VISIT VIA VIDEO  I connected with Russell Ortiz on 12/07/22 at 11:20 AM EDT by a video enabled telemedicine application and verified that I am speaking with the correct person using two identifiers. Location patient: Home Location provider: [facility], Office Persons participating in the virtual visit: Patient, Dr. Claiborne Billings and Ivonne Andrew, CMA  I discussed the limitations of evaluation and management by telemedicine and the availability of in person appointments. The patient expressed understanding and agreed to proceed.     Russell Ortiz , 1970-10-28, 52 y.o., male MRN: [id number] Patient Care Team    Relationship Specialty Notifications Start End  Natalia Leatherwood, DO PCP - General Family Medicine  08/16/22   Pyrtle, Carie Caddy, MD Consulting Physician Gastroenterology  04/22/19   [place]endale Chard, DO Consulting Physician Neurology  03/13/20     Chief Complaint  Patient presents with   Rash    Onset - Monday ; hands started burning on wednesday     Subjective: Russell Ortiz is a 52 y.o. Pt presents for an OV with complaints of itchy rash between fingers/hands of 5 days duration.  Associated symptoms include hand burning. Started after the [facility] went out on his truck and he was sweating. He has had similar rash in the past responded to lidex cream.     08/17/2022    9:14 AM 05/18/2022    8:06 AM 05/17/2021    9:56 AM 05/04/2021    3:52 PM 04/22/2020    8:59 AM  Depression screen PHQ 2/9  Decreased Interest 0 0 0 0 0  Down, Depressed, Hopeless 0 0 0 0 0  PHQ - 2 Score 0 0 0 0 0    Allergies  Allergen Reactions   Statins Other (See Comments)    mylagia   Social History   Social History Narrative   Married. 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    High school grad.    Wears seat belt. Exercises 3x or greater a week.    Former Smoker. Quit 1999, Occasional ETOH, no recreational drugs.    Drinks caffeine beverages. Uses herbal remedies.  Takes a multivitamin.    Smoker alarm in the home.    Feels safe in his relationships.    Right Handed   Lives in one story home      Past Medical History:  Diagnosis Date   Genital warts    Herpes zoster without complication 05/04/2021   Hyperlipidemia    Mass of left thigh 04/06/2018   PUD (peptic ulcer disease)    Past Surgical History:  Procedure Laterality Date   VASECTOMY  2010   Family History  Problem Relation Age of Onset   Lung cancer Father    Asthma Brother    Stroke Brother    Diabetes Maternal Grandfather    Diabetes Maternal Uncle    Hyperlipidemia Maternal Grandmother    Colon cancer Paternal Uncle    Prostate cancer Paternal Uncle    Stomach cancer Neg Hx    Esophageal cancer Neg Hx    Colon polyps Neg Hx    Allergies as of 12/02/2022       Reactions   Statins Other (See Comments)   mylagia        Medication List        Accurate as of December 02, 2022 11:22 AM. If you have any questions, ask your nurse or doctor.  STOP taking these medications    doxycycline 100 MG tablet Commonly known as: VIBRA-TABS Stopped by: Felix Pacini, DO   gabapentin 100 MG capsule Commonly known as: NEURONTIN Stopped by: Felix Pacini, DO   meclizine 25 MG tablet Commonly known as: ANTIVERT Stopped by: Felix Pacini, DO       TAKE these medications    fluocinonide cream 0.05 % Commonly known as: LIDEX Apply 1 Application topically 2 (two) times daily as needed for up to 14 days. What changed:  when to take this reasons to take this Changed by: Felix Pacini, DO   fluticasone 50 MCG/ACT nasal spray Commonly known as: FLONASE Place 2 sprays into both nostrils daily.   Repatha SureClick 140 MG/ML Soaj Generic drug: Evolocumab Inject 140 mg into the skin every 14 (fourteen) days.        All past medical history, surgical history, allergies, family history, immunizations andmedications were updated in the EMR today and reviewed under the  history and medication portions of their EMR.     ROS Negative, with the exception of above mentioned in HPI   Objective:  There were no vitals taken for this visit. There is no height or weight on file to calculate BMI. Physical Exam Vitals and nursing note reviewed.  Constitutional:      General: He is not in acute distress.    Appearance: Normal appearance. He is not toxic-appearing.  HENT:     Head: Normocephalic and atraumatic.  Eyes:     General: No scleral icterus.       Right eye: No discharge.        Left eye: No discharge.     Conjunctiva/sclera: Conjunctivae normal.  Pulmonary:     Effort: Pulmonary effort is normal.  Musculoskeletal:     Cervical back: Normal range of motion.  Skin:    Findings: Rash present.     Comments: Bilateral hands with fine raised rash between fingers  Neurological:     Mental Status: He is alert and oriented to person, place, and time. Mental status is at baseline.  Psychiatric:        Mood and Affect: Mood normal.        Behavior: Behavior normal.        Thought Content: Thought content normal.        Judgment: Judgment normal.      No results found. No results found. No results found for this or any previous visit (from the past 24 hour(s)).  Assessment/Plan: Russell Ortiz is a 52 y.o. male present for OV for  1. Heat rash Cool compresses. Avoid hot showers/baths. Stay in [facility] when can.  Lidex cream prescribed bid prn.  F/u prn  Reviewed expectations re: course of current medical issues. Discussed self-management of symptoms. Outlined signs and symptoms indicating need for more acute intervention. Patient verbalized understanding and all questions were answered. Patient received an After-Visit Summary.    No orders of the defined types were placed in this encounter.  Meds ordered this encounter  Medications   fluocinonide cream (LIDEX) 0.05 %    Sig: Apply 1 Application topically 2 (two) times daily as needed for up  to 14 days.    Dispense:  60 g    Refill:  1   Referral Orders  No referral(s) requested today     Note is dictated utilizing voice recognition software. Although note has been proof read prior to signing, occasional typographical errors still can be missed. If  any questions arise, please do not hesitate to call for verification.   electronically signed by:  Howard Pouch, DO  [place]

## 2022-12-06 ENCOUNTER — Other Ambulatory Visit (HOSPITAL_BASED_OUTPATIENT_CLINIC_OR_DEPARTMENT_OTHER): Payer: Self-pay

## 2022-12-06 ENCOUNTER — Ambulatory Visit
Admission: RE | Admit: 2022-12-06 | Discharge: 2022-12-06 | Disposition: A | Discharge status: Home / Self Care | Payer: 59 | Source: Ambulatory Visit | Attending: Urgent Care | Admitting: Urgent Care

## 2022-12-06 VITALS — BP 159/97 | HR 61 | Temp 98.0°F | Resp 20

## 2022-12-06 DIAGNOSIS — Z113 Encounter for screening for infections with a predominantly sexual mode of transmission: Secondary | ICD-10-CM | POA: Diagnosis not present

## 2022-12-06 DIAGNOSIS — L309 Dermatitis, unspecified: Secondary | ICD-10-CM | POA: Diagnosis not present

## 2022-12-06 MED ORDER — CLOBETASOL PROPIONATE 0.05 % EX CREA
TOPICAL_CREAM | CUTANEOUS | 3 refills | Status: DC
  Filled 2022-12-06: qty 60, 30d supply, fill #0
  Filled 2023-01-21: qty 60, 30d supply, fill #1
  Filled 2023-05-28: qty 60, 30d supply, fill #2

## 2022-12-06 NOTE — ED Provider Notes (Signed)
[facility]  Note:  This document was prepared using Conservation officer, historic buildings and may include unintentional dictation errors.  MRN: [id number] DOB: [birth year]  Subjective:   Russell Ortiz is a 52 y.o. male presenting for concerns about testing for sexually transmitted infection.  His wife tested positive for bacterial vaginosis. Denies dysuria, hematuria, urinary frequency, penile discharge, penile swelling, testicular pain, testicular swelling, anal pain, groin pain.  Has already had HIV and syphilis testing, does not want this checked today.  No current facility-administered medications for this encounter.  Current Outpatient Medications:    clobetasol cream (TEMOVATE) 0.05 %, Apply a thin layer to rash on hands twice daily for up to 1-2 weeks and stop once rash resolved. May repeat for rash flares., Disp: 60 g, Rfl: 3   Evolocumab 140 MG/ML SOAJ, Inject 140 mg into the skin every 14 (fourteen) days., Disp: 2 mL, Rfl: 11   fluocinonide cream (LIDEX) 0.05 %, Apply 1 Application topically 2 (two) times daily as needed for up to 14 days., Disp: 60 g, Rfl: 1   fluticasone (FLONASE) 50 MCG/ACT nasal spray, Place 2 sprays into both nostrils daily., Disp: 16 g, Rfl: 3   Allergies  Allergen Reactions   Statins Other (See Comments)    mylagia    Past Medical History:  Diagnosis Date   Genital warts    Herpes zoster without complication 05/04/2021   Hyperlipidemia    Mass of left thigh 04/06/2018   PUD (peptic ulcer disease)      Past Surgical History:  Procedure Laterality Date   VASECTOMY  2010    Family History  Problem Relation Age of Onset   Lung cancer Father    Asthma Brother    Stroke Brother    Diabetes Maternal Grandfather    Diabetes Maternal Uncle    Hyperlipidemia Maternal Grandmother    Colon cancer Paternal Uncle    Prostate cancer Paternal Uncle    Stomach cancer Neg Hx    Esophageal cancer Neg Hx    Colon polyps Neg Hx      Social History   Tobacco Use   Smoking status: Some Days    Types: Cigars   Smokeless tobacco: Never  Vaping Use   Vaping Use: Never used  Substance Use Topics   Alcohol use: Yes    Comment: weekly   Drug use: No    ROS   Objective:   Vitals: BP (!) 159/97 (BP Location: Right Arm)   Pulse 61   Temp 98 F (36.7 C) (Oral)   Resp 20   SpO2 96%   Physical Exam Constitutional:      General: He is not in acute distress.    Appearance: Normal appearance. He is well-developed and normal weight. He is not ill-appearing, toxic-appearing or diaphoretic.  HENT:     Head: Normocephalic and atraumatic.     Right Ear: External ear normal.     Left Ear: External ear normal.     Nose: Nose normal.     Mouth/Throat:     Pharynx: Oropharynx is clear.  Eyes:     General: No scleral icterus.       Right eye: No discharge.        Left eye: No discharge.     Extraocular Movements: Extraocular movements intact.  Cardiovascular:     Rate and Rhythm: Normal rate.  Pulmonary:     Effort: Pulmonary effort is normal.  Genitourinary:  Penis: Circumcised. No phimosis, paraphimosis, hypospadias, erythema, tenderness, discharge, swelling or lesions.   Musculoskeletal:     Cervical back: Normal range of motion.  Neurological:     Mental Status: He is alert and oriented to person, place, and time.  Psychiatric:        Mood and Affect: Mood normal.        Behavior: Behavior normal.        Thought Content: Thought content normal.        Judgment: Judgment normal.     Assessment and Plan :   PDMP not reviewed this encounter.  1. Screen for STD (sexually transmitted disease)    Patient requested cytology test.  I advised that bacterial vaginosis is not an STI testing will not test for this.  Patient verbalized understanding.  Will treat as appropriate based on the lab results.   Wallis Bamberg, [place] 12/06/22 1043

## 2022-12-06 NOTE — ED Triage Notes (Signed)
Pt requesting STD if needed-wife tested +BV-denies sx-NAD-steady gait

## 2022-12-07 LAB — CYTOLOGY, (ORAL, ANAL, URETHRAL) ANCILLARY ONLY
Chlamydia: NEGATIVE
Comment: NEGATIVE
Comment: NEGATIVE
Comment: NORMAL
Neisseria Gonorrhea: NEGATIVE
Trichomonas: NEGATIVE

## 2023-01-21 ENCOUNTER — Other Ambulatory Visit (HOSPITAL_BASED_OUTPATIENT_CLINIC_OR_DEPARTMENT_OTHER): Payer: Self-pay

## 2023-01-24 ENCOUNTER — Other Ambulatory Visit (HOSPITAL_COMMUNITY): Payer: Self-pay

## 2023-01-25 ENCOUNTER — Ambulatory Visit: Payer: 59 | Admitting: Dermatology

## 2023-03-01 ENCOUNTER — Other Ambulatory Visit (HOSPITAL_BASED_OUTPATIENT_CLINIC_OR_DEPARTMENT_OTHER): Payer: Self-pay

## 2023-03-01 ENCOUNTER — Ambulatory Visit
Admission: EM | Admit: 2023-03-01 | Discharge: 2023-03-01 | Disposition: A | Discharge status: Home / Self Care | Payer: 59 | Attending: Internal Medicine | Admitting: Internal Medicine

## 2023-03-01 DIAGNOSIS — L731 Pseudofolliculitis barbae: Secondary | ICD-10-CM

## 2023-03-01 MED ORDER — MUPIROCIN 2 % EX OINT
1.0000 | TOPICAL_OINTMENT | Freq: Three times a day (TID) | CUTANEOUS | 0 refills | Status: AC
Start: 2023-03-01 — End: 2023-03-09
  Filled 2023-03-01: qty 22, 8d supply, fill #0

## 2023-03-01 NOTE — Discharge Instructions (Signed)
Start mupirocin antibiotic ointment topically 3 times a day for 1 week.  Warm compresses to the area as needed.  You may take Tylenol or ibuprofen as needed for pain.  Follow-up with your PCP in 2 days for recheck.  Please go to the ER for any worsening symptoms.  I hope you feel better soon!

## 2023-03-01 NOTE — ED Triage Notes (Signed)
Pt presents with c/o insect bite on the middle of his back. States he noticed it this morning, he put on some cream to the area.

## 2023-03-01 NOTE — ED Provider Notes (Signed)
[facility]    CSN: [id number] Arrival date & time: 03/01/23  1640      History   Chief Complaint Chief Complaint  Patient presents with   Insect Bite    HPI Russell Ortiz is a 52 y.o. male presents for a bugbite or ingrown hair.  Patient reports today he noticed a painful/itchy red raised bump on his mid back.  Thinks it may be a bug bite or an ingrown hair but he is not sure.  Does not recall being bitten by anything specific.  No drainage, fevers, chills.  Denies reaction to bug bite similar to this in the past.  No OTC medications have been used for treatment.  No other concerns at this time.  HPI  Past Medical History:  Diagnosis Date   Genital warts    Herpes zoster without complication 05/04/2021   Hyperlipidemia    Mass of left thigh 04/06/2018   PUD (peptic ulcer disease)     Patient Active Problem List   Diagnosis Date Noted   Seborrheic dermatitis of scalp 07/12/2021   Cervical radiculopathy 04/20/2018   Neutropenia (HCC) 04/12/2017   Overweight (BMI 25.0-29.9) 11/28/2016   Elevated hemoglobin A1c 08/25/2016   Elevated liver function tests 03/12/2015   Hyperlipemia 03/12/2015    Past Surgical History:  Procedure Laterality Date   VASECTOMY  2010       Home Medications    Prior to Admission medications   Medication Sig Start Date End Date Taking? Authorizing Provider  mupirocin ointment (BACTROBAN) 2 % Apply 1 Application topically 3 (three) times daily for 7 days. 03/01/23 03/08/23 Yes Radford Pax, NP  clobetasol cream (TEMOVATE) 0.05 % Apply a thin layer to rash on hands twice daily for up to 1-2 weeks and stop once rash resolved. May repeat for rash flares. 12/06/22     Evolocumab 140 MG/ML SOAJ Inject 140 mg into the skin every 14 (fourteen) days. 05/18/22   Kuneff, Renee A, DO  fluticasone (FLONASE) 50 MCG/ACT nasal spray Place 2 sprays into both nostrils daily. 08/29/22   Claiborne Billings, Renee A, DO  metoprolol tartrate (LOPRESSOR) 100 MG tablet  Take one tablet by mouth two hours prior to CT 03/12/20 04/22/20  Wendall Stade, MD    Family History Family History  Problem Relation Age of Onset   Lung cancer Father    Asthma Brother    Stroke Brother    Diabetes Maternal Grandfather    Diabetes Maternal Uncle    Hyperlipidemia Maternal Grandmother    Colon cancer Paternal Uncle    Prostate cancer Paternal Uncle    Stomach cancer Neg Hx    Esophageal cancer Neg Hx    Colon polyps Neg Hx     Social History Social History   Tobacco Use   Smoking status: Some Days    Types: Cigars   Smokeless tobacco: Never  Vaping Use   Vaping status: Never Used  Substance Use Topics   Alcohol use: Yes    Comment: weekly   Drug use: No     Allergies   Statins   Review of Systems Review of Systems  Skin:  Positive for wound.     Physical Exam Triage Vital Signs ED Triage Vitals [03/01/23 1656]  Encounter Vitals Group     BP (!) 145/91     Systolic BP Percentile      Diastolic BP Percentile      Pulse Rate 64     Resp 18  Temp 98.4 F (36.9 C)     Temp Source Oral     SpO2 94 %     Weight      Height      Head Circumference      Peak Flow      Pain Score 4     Pain Loc      Pain Education      Exclude from Growth Chart    No data found.  Updated Vital Signs BP (!) 145/91 (BP Location: Right Arm)   Pulse 64   Temp 98.4 F (36.9 C) (Oral)   Resp 18   SpO2 94%   Visual Acuity Right Eye Distance:   Left Eye Distance:   Bilateral Distance:    Right Eye Near:   Left Eye Near:    Bilateral Near:     Physical Exam Vitals and nursing note reviewed.  Constitutional:      General: He is not in acute distress.    Appearance: Normal appearance. He is not ill-appearing.  HENT:     Head: Normocephalic and atraumatic.  Eyes:     Pupils: Pupils are equal, round, and reactive to light.  Cardiovascular:     Rate and Rhythm: Normal rate.  Pulmonary:     Effort: Pulmonary effort is normal.   Musculoskeletal:       Arms:     Comments: Single mildly erythematous tender papule to the right mid back.  No induration or fluctuance.  No drainage.  No warmth.  Skin:    General: Skin is warm and dry.  Neurological:     General: No focal deficit present.     Mental Status: He is alert and oriented to person, place, and time.  Psychiatric:        Mood and Affect: Mood normal.        Behavior: Behavior normal.      UC Treatments / Results  Labs (all labs ordered are listed, but only abnormal results are displayed) Labs Reviewed - No data to display  EKG   Radiology No results found.  Procedures Procedures (including critical care time)  Medications Ordered in UC Medications - No data to display  Initial Impression / Assessment and Plan / UC Course  I have reviewed the triage vital signs and the nursing notes.  Pertinent labs & imaging results that were available during my care of the patient were reviewed by me and considered in my medical decision making (see chart for details).     Reviewed exam and symptoms with patient.  Discussed consistent more likely an ingrown hair and bug bite.  Will do mupirocin topically and advised warm compresses.  OTC analgesics as needed.  PCP follow-up if symptoms do not improve.  ER precautions reviewed. Final Clinical Impressions(s) / UC Diagnoses   Final diagnoses:  Ingrown hair     Discharge Instructions      Start mupirocin antibiotic ointment topically 3 times a day for 1 week.  Warm compresses to the area as needed.  You may take Tylenol or ibuprofen as needed for pain.  Follow-up with your PCP in 2 days for recheck.  Please go to the ER for any worsening symptoms.  I hope you feel better soon!   ED Prescriptions     Medication Sig Dispense Auth. Provider   mupirocin ointment (BACTROBAN) 2 % Apply 1 Application topically 3 (three) times daily for 7 days. 22 g Radford Pax, NP  PDMP not reviewed this  encounter.   Radford Pax, NP 03/01/23 [phone]

## 2023-03-08 ENCOUNTER — Encounter: Payer: Self-pay | Admitting: Internal Medicine

## 2023-04-01 ENCOUNTER — Ambulatory Visit
Admission: EM | Admit: 2023-04-01 | Discharge: 2023-04-01 | Disposition: A | Discharge status: Home / Self Care | Payer: 59 | Attending: Internal Medicine | Admitting: Internal Medicine

## 2023-04-01 DIAGNOSIS — M67431 Ganglion, right wrist: Secondary | ICD-10-CM | POA: Diagnosis not present

## 2023-04-01 NOTE — ED Triage Notes (Signed)
Pt reports he has a bump in the right hand x 1 week. Painful when he moves his hand.

## 2023-04-01 NOTE — ED Provider Notes (Signed)
[facility]  Note:  This document was prepared using Conservation officer, historic buildings and may include unintentional dictation errors.  MRN: [id number] DOB: 1971/03/09  Subjective:   Russell Ortiz is a 52 y.o. male presenting for 1 week history of a persistent bump over the right wrist/hand.  Generally does not bother him unless he starts to move his hand or wrist a lot.  No drainage of pus or bleeding.  No history of gout.  No current facility-administered medications for this encounter.  Current Outpatient Medications:    clobetasol cream (TEMOVATE) 0.05 %, Apply a thin layer to rash on hands twice daily for up to 1-2 weeks and stop once rash resolved. May repeat for rash flares., Disp: 60 g, Rfl: 3   Evolocumab 140 MG/ML SOAJ, Inject 140 mg into the skin every 14 (fourteen) days., Disp: 2 mL, Rfl: 11   fluticasone (FLONASE) 50 MCG/ACT nasal spray, Place 2 sprays into both nostrils daily., Disp: 16 g, Rfl: 3   Allergies  Allergen Reactions   Statins Other (See Comments)    mylagia    Past Medical History:  Diagnosis Date   Genital warts    Herpes zoster without complication 05/04/2021   Hyperlipidemia    Mass of left thigh 04/06/2018   PUD (peptic ulcer disease)      Past Surgical History:  Procedure Laterality Date   VASECTOMY  2010    Family History  Problem Relation Age of Onset   Lung cancer Father    Asthma Brother    Stroke Brother    Diabetes Maternal Grandfather    Diabetes Maternal Uncle    Hyperlipidemia Maternal Grandmother    Colon cancer Paternal Uncle    Prostate cancer Paternal Uncle    Stomach cancer Neg Hx    Esophageal cancer Neg Hx    Colon polyps Neg Hx     Social History   Tobacco Use   Smoking status: Some Days    Types: Cigars   Smokeless tobacco: Never  Vaping Use   Vaping status: Never Used  Substance Use Topics   Alcohol use: Yes    Comment: weekly   Drug use: No    ROS   Objective:    Vitals: BP (!) 164/95 (BP Location: Right Arm)   Pulse 66   Temp 98.5 F (36.9 C) (Oral)   Resp 16   SpO2 97%   Physical Exam Constitutional:      General: He is not in acute distress.    Appearance: Normal appearance. He is well-developed and normal weight. He is not ill-appearing, toxic-appearing or diaphoretic.  HENT:     Head: Normocephalic and atraumatic.     Right Ear: External ear normal.     Left Ear: External ear normal.     Nose: Nose normal.     Mouth/Throat:     Pharynx: Oropharynx is clear.  Eyes:     General: No scleral icterus.       Right eye: No discharge.        Left eye: No discharge.     Extraocular Movements: Extraocular movements intact.  Cardiovascular:     Rate and Rhythm: Normal rate.  Pulmonary:     Effort: Pulmonary effort is normal.  Musculoskeletal:       Hands:     Cervical back: Normal range of motion.  Neurological:     Mental Status: He is alert and oriented to person, place, and time.  Psychiatric:        Mood and Affect: Mood normal.        Behavior: Behavior normal.        Thought Content: Thought content normal.        Judgment: Judgment normal.     Assessment and Plan :   PDMP not reviewed this encounter.  1. Ganglion cyst of dorsum of right wrist    Physical exam findings highly suggestive of a ganglion cyst.  Low suspicion for an infectious process.  Recommended follow-up with hand specialist.  In the meantime can use ibuprofen for pain relief.  Counseled patient on potential for adverse effects with medications prescribed/recommended today, ER and return-to-clinic precautions discussed, patient verbalized understanding.    Wallis Bamberg, PA-C 04/01/23 1028

## 2023-04-05 ENCOUNTER — Encounter: Payer: Self-pay | Admitting: Urology

## 2023-04-05 ENCOUNTER — Ambulatory Visit: Payer: 59 | Admitting: Urology

## 2023-04-05 ENCOUNTER — Encounter: Payer: 59 | Admitting: Urology

## 2023-04-05 VITALS — BP 139/82 | HR 73 | Ht 73.0 in | Wt 220.0 lb

## 2023-04-05 DIAGNOSIS — R5383 Other fatigue: Secondary | ICD-10-CM | POA: Diagnosis not present

## 2023-04-05 DIAGNOSIS — N4889 Other specified disorders of penis: Secondary | ICD-10-CM

## 2023-04-05 NOTE — Progress Notes (Signed)
H&P  Chief Complaint: Fatigue  History of Present Illness: 52 year old male self-referred for evaluation and management of fatigue.  He is worried about having a low testosterone level.  He has had a testosterone level checked (in the epic system, unknown but notes to the patient) in 2011.  The 2 levels were 410 and 574.  He does not get regular exercise.  He does watch his diet.  He denies any problems with erections and has a normal libido.  Past Medical History:  Diagnosis Date   Genital warts    Herpes zoster without complication 05/04/2021   Hyperlipidemia    Mass of left thigh 04/06/2018   PUD (peptic ulcer disease)     Past Surgical History:  Procedure Laterality Date   VASECTOMY  2010    Home Medications:  Allergies as of 04/05/2023       Reactions   Statins Other (See Comments)   mylagia        Medication List        Accurate as of April 05, 2023 11:34 AM. If you have any questions, ask your nurse or doctor.          clobetasol cream 0.05 % Commonly known as: TEMOVATE Apply a thin layer to rash on hands twice daily for up to 1-2 weeks and stop once rash resolved. May repeat for rash flares.   fluticasone 50 MCG/ACT nasal spray Commonly known as: FLONASE Place 2 sprays into both nostrils daily.   Repatha SureClick 140 MG/ML Soaj Generic drug: Evolocumab Inject 140 mg into the skin every 14 (fourteen) days.        Allergies:  Allergies  Allergen Reactions   Statins Other (See Comments)    mylagia    Family History  Problem Relation Age of Onset   Lung cancer Father    Asthma Brother    Stroke Brother    Diabetes Maternal Grandfather    Diabetes Maternal Uncle    Hyperlipidemia Maternal Grandmother    Colon cancer Paternal Uncle    Prostate cancer Paternal Uncle    Stomach cancer Neg Hx    Esophageal cancer Neg Hx    Colon polyps Neg Hx     Social History:  reports that he has been smoking cigars. He has never used smokeless  tobacco. He reports current alcohol use. He reports that he does not use drugs.  ROS: A complete review of systems was performed.  All systems are negative except for pertinent findings as noted.  Physical Exam:  Vital signs in last 24 hours: There were no vitals taken for this visit. Constitutional:  Alert and oriented, No acute distress Cardiovascular: Regular rate  Respiratory: Normal respiratory effort GI: No inguinal hernias Genitourinary: Normal male phallus, testes are descended bilaterally and non-tender and without masses, scrotum is normal in appearance without lesions or masses, perineum is normal on inspection.  Normal anal sphincter tone.  Prostate is 20 mL, symmetric, nonnodular.  There are no rectal masses. Lymphatic: No lymphadenopathy Neurologic: Grossly intact, no focal deficits Psychiatric: Normal mood and affect    I have reviewed notes from previous physicians  I have independently reviewed prior imaging  I have reviewed prior PSA results--low/normal  I have reviewed prior urine culture  Prior testosterone levels reviewed   Impression/Assessment:  Low energy level, rule out low testosterone  Plan:  I let the patient know that there are other reasons other than low testosterone that are associated with low energy levels  Heart  healthy lifestyle discussed  I will have testosterone panel drawn in the next week or 2, notify results by way of MyChart as well as further follow-up

## 2023-04-05 NOTE — Progress Notes (Deleted)
   Assessment: No diagnosis found.   Plan: ***  Chief Complaint: No chief complaint on file.   History of Present Illness:  Russell Ortiz is a 52 y.o. male who is seen for evaluation of fatigue, concern for low testosterone.  ADAM score =    Past Medical History:  Past Medical History:  Diagnosis Date   Genital warts    Herpes zoster without complication 05/04/2021   Hyperlipidemia    Mass of left thigh 04/06/2018   PUD (peptic ulcer disease)     Past Surgical History:  Past Surgical History:  Procedure Laterality Date   VASECTOMY  2010    Allergies:  Allergies  Allergen Reactions   Statins Other (See Comments)    mylagia    Family History:  Family History  Problem Relation Age of Onset   Lung cancer Father    Asthma Brother    Stroke Brother    Diabetes Maternal Grandfather    Diabetes Maternal Uncle    Hyperlipidemia Maternal Grandmother    Colon cancer Paternal Uncle    Prostate cancer Paternal Uncle    Stomach cancer Neg Hx    Esophageal cancer Neg Hx    Colon polyps Neg Hx     Social History:  Social History   Tobacco Use   Smoking status: Some Days    Types: Cigars   Smokeless tobacco: Never  Vaping Use   Vaping status: Never Used  Substance Use Topics   Alcohol use: Yes    Comment: weekly   Drug use: No    Review of symptoms:  Constitutional:  Negative for unexplained weight loss, night sweats, fever, chills ENT:  Negative for nose bleeds, sinus pain, painful swallowing CV:  Negative for chest pain, shortness of breath, exercise intolerance, palpitations, loss of consciousness Resp:  Negative for cough, wheezing, shortness of breath GI:  Negative for nausea, vomiting, diarrhea, bloody stools GU:  Positives noted in HPI; otherwise negative for gross hematuria, dysuria, urinary incontinence Neuro:  Negative for seizures, poor balance, limb weakness, slurred speech Psych:  Negative for lack of energy, depression, anxiety Endocrine:   Negative for polydipsia, polyuria, symptoms of hypoglycemia (dizziness, hunger, sweating) Hematologic:  Negative for anemia, purpura, petechia, prolonged or excessive bleeding, use of anticoagulants  Allergic:  Negative for difficulty breathing or choking as a result of exposure to anything; no shellfish allergy; no allergic response (rash/itch) to materials, foods  Physical exam: There were no vitals taken for this visit. GENERAL APPEARANCE:  Well appearing, well developed, well nourished, NAD HEENT: Atraumatic, Normocephalic, oropharynx clear. NECK: Supple without lymphadenopathy or thyromegaly. LUNGS: Clear to auscultation bilaterally. HEART: Regular Rate and Rhythm without murmurs, gallops, or rubs. ABDOMEN: Soft, non-tender, No Masses. EXTREMITIES: Moves all extremities well.  Without clubbing, cyanosis, or edema. NEUROLOGIC:  Alert and oriented x 3, normal gait, CN II-XII grossly intact.  MENTAL STATUS:  Appropriate. BACK:  Non-tender to palpation.  No CVAT SKIN:  Warm, dry and intact.   GU: Penis:  {Exam; penis:5791} Meatus: {Meatus:15530} Scrotum: {pe scrotum:310183} Testis: {Exam; testicles:5790} Epididymis: {epididymis ZOXW:960454} Prostate: {Exam; prostate:5793} Rectum: {rectal exam:26517}   Results: No results found for this or any previous visit (from the past 24 hour(s)).

## 2023-04-17 ENCOUNTER — Other Ambulatory Visit: Payer: 59

## 2023-04-17 DIAGNOSIS — R5383 Other fatigue: Secondary | ICD-10-CM | POA: Diagnosis not present

## 2023-04-20 LAB — TESTOSTERONE,FREE AND TOTAL
Testosterone, Free: 4 pg/mL — ABNORMAL LOW (ref 7.2–24.0)
Testosterone: 539 ng/dL (ref 264–916)

## 2023-04-24 ENCOUNTER — Ambulatory Visit (AMBULATORY_SURGERY_CENTER): Payer: 59

## 2023-04-24 ENCOUNTER — Other Ambulatory Visit (HOSPITAL_BASED_OUTPATIENT_CLINIC_OR_DEPARTMENT_OTHER): Payer: Self-pay

## 2023-04-24 VITALS — Ht 73.0 in | Wt 225.0 lb

## 2023-04-24 DIAGNOSIS — Z860100 Personal history of colon polyps, unspecified: Secondary | ICD-10-CM

## 2023-04-24 MED ORDER — NA SULFATE-K SULFATE-MG SULF 17.5-3.13-1.6 GM/177ML PO SOLN
1.0000 | Freq: Once | ORAL | 0 refills | Status: AC
  Filled 2023-04-24: qty 354, 1d supply, fill #0

## 2023-04-24 NOTE — Progress Notes (Signed)

## 2023-04-25 ENCOUNTER — Encounter: Payer: Self-pay | Admitting: Internal Medicine

## 2023-05-08 ENCOUNTER — Other Ambulatory Visit (HOSPITAL_BASED_OUTPATIENT_CLINIC_OR_DEPARTMENT_OTHER): Payer: Self-pay

## 2023-05-12 ENCOUNTER — Encounter: Payer: 59 | Admitting: Internal Medicine

## 2023-05-22 NOTE — Patient Instructions (Addendum)
Return in about 1 year (around 05/23/2024) for cpe (20 min), Routine chronic condition follow-up.  And 4 week nurse visit. BP recheck      Great to see you today.  I have refilled the medication(s) we provide.   If labs were collected or images ordered, we will inform you of  results once we have received them and reviewed. We will contact you either by echart message, or telephone call.  Please give ample time to the testing facility, and our office to run,  receive and review results. Please do not call inquiring of results, even if you can see them in your chart. We will contact you as soon as we are able. If it has been over 1 week since the test was completed, and you have not yet heard from [place], then please call us.    - echart message- for normal results that have been seen by the patient already.   - telephone call: abnormal results or if patient has not viewed results in their echart.  If a referral to a specialist was entered for you, please call us in 2 weeks if you have not heard from the specialist office to schedule.

## 2023-05-22 NOTE — Progress Notes (Signed)
Patient ID: Russell Ortiz, male  DOB: 1970-09-22, 52 y.o.   MRN: [id number] Patient Care Team    Relationship Specialty Notifications Start End  Natalia Leatherwood, DO PCP - General Family Medicine  08/16/22   Pyrtle, Carie Caddy, MD Consulting Physician Gastroenterology  04/22/19   Glendale Chard, DO Consulting Physician Neurology  03/13/20     Chief Complaint  Patient presents with   Annual Exam    Combined chronic condition management; pt is fasting    Subjective: [name] is a 52 y.o. male present for CPE and combined chronic condition management All past medical history, surgical history, allergies, family history, immunizations, medications and social history were updated in the electronic medical record today. All recent labs, ED visits and hospitalizations within the last year were reviewed.  Health maintenance:  Colonoscopy: [name] with a P-uncle colon cancer at 45. Completed 06/01/2018, Dr. Rhea Belton 5 yr follow up. Due this month- scheduled Immunizations:  tdap UTD 04/2018, influenza updated today, shingrix series completed  Infectious disease screening: HIV and hep c screen completed PSA: no fhx.  Lab Results  Component Value Date   PSA 0.29 05/18/2022   PSA 0.25 05/17/2021   PSA 0.20 04/22/2020  , pt was counseled on prostate cancer screenings.  Patient has a Dental home. Hospitalizations/ED visits: reviewed     08/17/2022    9:14 AM 05/18/2022    8:06 AM 05/17/2021    9:56 AM 05/04/2021    3:52 PM 04/22/2020    8:59 AM  Depression screen PHQ 2/9  Decreased Interest 0 0 0 0 0  Down, Depressed, Hopeless 0 0 0 0 0  PHQ - 2 Score 0 0 0 0 0       No data to display                06/15/2020   10:00 AM 03/13/2020   11:13 AM 09/14/2017    8:26 AM 03/17/2016    8:51 AM  Fall Risk   Falls in the past year? 0 0 No No  Number falls in past yr: 0 0    Injury with Fall? 0 0      Immunization History  Administered Date(s) Administered   Influenza Split 03/29/2012    Influenza, Seasonal, Injecte, Preservative Fre 05/23/2023   Influenza,inj,Quad PF,6+ Mos 03/17/2016, 04/13/2018, 04/22/2019, 04/22/2020, 05/17/2021, 05/18/2022   PFIZER(Purple Top)SARS-COV-2 Vaccination 09/07/2019, 10/02/2019, 05/06/2020   Td 08/15/2007   Tdap 04/13/2018   Zoster Recombinant(Shingrix) 05/18/2022, 08/17/2022   Past Medical History:  Diagnosis Date   Genital warts    Herpes zoster without complication 05/04/2021   Hyperlipidemia    Mass of left thigh 04/06/2018   PUD (peptic ulcer disease)    Allergies  Allergen Reactions   Statins Other (See Comments)    mylagia   Past Surgical History:  Procedure Laterality Date   VASECTOMY  2010   Family History  Problem Relation Age of Onset   Lung cancer Father    Asthma Brother    Stroke Brother    Diabetes Maternal Grandfather    Diabetes Maternal Uncle    Hyperlipidemia Maternal Grandmother    Colon cancer Paternal Uncle    Prostate cancer Paternal Uncle    Stomach cancer Neg Hx    Esophageal cancer Neg Hx    Colon polyps Neg Hx    Social History   Social History Narrative   Married. 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  High school grad.    Wears seat belt. Exercises 3x or greater a week.    Former Smoker. Quit 1999, Occasional ETOH, no recreational drugs.    Drinks caffeine beverages. Uses herbal remedies. Takes a multivitamin.    Smoker alarm in the home.    Feels safe in his relationships.    Right Handed   Lives in one story home       Allergies as of 05/23/2023       Reactions   Statins Other (See Comments)   mylagia        Medication List        Accurate as of May 23, 2023  9:09 AM. If you have any questions, ask your nurse or doctor.          amLODipine 2.5 MG tablet Commonly known as: NORVASC Take 1 tablet (2.5 mg total) by mouth daily. Started by: Felix Pacini   clobetasol cream 0.05 % Commonly known as: TEMOVATE Apply a thin layer to rash  on hands twice daily for up to 1-2 weeks and stop once rash resolved. May repeat for rash flares.   Evolocumab 140 MG/ML Soaj Inject 140 mg into the skin every 14 (fourteen) days.   fluticasone 50 MCG/ACT nasal spray Commonly known as: FLONASE Place 2 sprays into both nostrils daily.       All past medical history, surgical history, allergies, family history, immunizations andmedications were updated in the EMR today and reviewed under the history and medication portions of their EMR.      No results found.  ROS 14 pt review of systems performed and negative (unless mentioned in an HPI)  Objective: BP (!) 140/80   Pulse 68   Temp 98 F (36.7 C)   Ht 6\' 1"  (1.854 m)   Wt 222 lb 6.4 oz (100.9 kg)   SpO2 98%   BMI 29.34 kg/m  Physical Exam Constitutional:      General: He is not in acute distress.    Appearance: Normal appearance. He is not ill-appearing, toxic-appearing or diaphoretic.  HENT:     Head: Normocephalic and atraumatic.     Right Ear: Tympanic membrane, ear canal and external ear normal. There is no impacted cerumen.     Left Ear: Tympanic membrane, ear canal and external ear normal. There is no impacted cerumen.     Nose: Nose normal. No congestion or rhinorrhea.     Mouth/Throat:     Mouth: Mucous membranes are moist.     Pharynx: Oropharynx is clear. No oropharyngeal exudate or posterior oropharyngeal erythema.  Eyes:     General: No scleral icterus.       Right eye: No discharge.        Left eye: No discharge.     Extraocular Movements: Extraocular movements intact.     Pupils: Pupils are equal, round, and reactive to light.  Cardiovascular:     Rate and Rhythm: Normal rate and regular rhythm.     Pulses: Normal pulses.     Heart sounds: Normal heart sounds. No murmur heard.    No friction rub. No gallop.  Pulmonary:     Effort: Pulmonary effort is normal. No respiratory distress.     Breath sounds: Normal breath sounds. No stridor. No wheezing,  rhonchi or rales.  Chest:     Chest wall: No tenderness.  Abdominal:     General: Abdomen is flat. Bowel sounds are normal. There is no distension.  Palpations: Abdomen is soft. There is no mass.     Tenderness: There is no abdominal tenderness. There is no right CVA tenderness, left CVA tenderness, guarding or rebound.     Hernia: No hernia is present.  Musculoskeletal:        General: No swelling or tenderness. Normal range of motion.     Cervical back: Normal range of motion and neck supple.     Right lower leg: No edema.     Left lower leg: No edema.  Lymphadenopathy:     Cervical: No cervical adenopathy.  Skin:    General: Skin is warm and dry.     Coloration: Skin is not jaundiced.     Findings: No bruising, lesion or rash.  Neurological:     General: No focal deficit present.     Mental Status: He is alert and oriented to person, place, and time. Mental status is at baseline.     Cranial Nerves: No cranial nerve deficit.     Sensory: No sensory deficit.     Motor: No weakness.     Coordination: Coordination normal.     Gait: Gait normal.     Deep Tendon Reflexes: Reflexes normal.  Psychiatric:        Mood and Affect: Mood normal.        Behavior: Behavior normal.        Thought Content: Thought content normal.        Judgment: Judgment normal.     No results found.  Assessment/plan: UMBERTO EGLE is a 52 y.o. male present for CPE HTN/Hyperlipidemia, unspecified hyperlipidemia type/Overweight (BMI 25.0-29.9) - unable to tolerate statin Continue repatha q 14 d injection.  Start amlodipine 2.5 mg every day-4 week follow up nurse visit Lipid panel collected Elevated hemoglobin A1c A1c collected Prostate cancer screening PSA collected Routine general medical examination at a health care facility Patient was encouraged to exercise greater than 150 minutes a week. Patient was encouraged to choose a diet filled with fresh fruits and vegetables, and lean meats. AVS  provided to patient today for education/recommendation on gender specific health and safety maintenance. Colonoscopy: [name] with a P-uncle colon cancer at 69. Completed 06/01/2018, Dr. Rhea Belton 5 yr follow up. Due this month Immunizations:  tdap UTD 04/2018, influenza updated today, shingrix series completed  Infectious disease screening: HIV and hep c screen completed - CBC - Comp Met (CMET) - TSH Prostate cancer screening - PSA Influenza vaccine needed - Flu vaccine trivalent PF, 6mos and older(Flulaval,Afluria,Fluarix,Fluzone)  Return in about 1 year (around 05/23/2024) for cpe (20 min), Routine chronic condition follow-up.   Orders Placed This Encounter  Procedures   Flu vaccine trivalent PF, 6mos and older(Flulaval,Afluria,Fluarix,Fluzone)   CBC   Comp Met (CMET)   TSH   Lipid panel   Hemoglobin A1c   PSA   Meds ordered this encounter  Medications   Evolocumab 140 MG/ML SOAJ    Sig: Inject 140 mg into the skin every 14 (fourteen) days.    Dispense:  2 mL    Refill:  11   amLODipine (NORVASC) 2.5 MG tablet    Sig: Take 1 tablet (2.5 mg total) by mouth daily.    Dispense:  90 tablet    Refill:  3    Referral Orders  No referral(s) requested today     Note is dictated utilizing voice recognition software. Although note has been proof read prior to signing, occasional typographical errors still can be missed. If any  questions arise, please do not hesitate to call for verification.  Electronically signed by: Felix Pacini, DO [facility] [place]

## 2023-05-23 ENCOUNTER — Encounter (HOSPITAL_BASED_OUTPATIENT_CLINIC_OR_DEPARTMENT_OTHER): Payer: Self-pay

## 2023-05-23 ENCOUNTER — Encounter: Payer: Self-pay | Admitting: Family Medicine

## 2023-05-23 ENCOUNTER — Ambulatory Visit (INDEPENDENT_AMBULATORY_CARE_PROVIDER_SITE_OTHER): Payer: 59 | Admitting: Family Medicine

## 2023-05-23 ENCOUNTER — Other Ambulatory Visit (HOSPITAL_BASED_OUTPATIENT_CLINIC_OR_DEPARTMENT_OTHER): Payer: Self-pay

## 2023-05-23 VITALS — BP 140/80 | HR 68 | Temp 98.0°F | Ht 73.0 in | Wt 222.4 lb

## 2023-05-23 DIAGNOSIS — I10 Essential (primary) hypertension: Secondary | ICD-10-CM | POA: Insufficient documentation

## 2023-05-23 DIAGNOSIS — E782 Mixed hyperlipidemia: Secondary | ICD-10-CM

## 2023-05-23 DIAGNOSIS — Z23 Encounter for immunization: Secondary | ICD-10-CM | POA: Diagnosis not present

## 2023-05-23 DIAGNOSIS — R7309 Other abnormal glucose: Secondary | ICD-10-CM | POA: Diagnosis not present

## 2023-05-23 DIAGNOSIS — Z125 Encounter for screening for malignant neoplasm of prostate: Secondary | ICD-10-CM | POA: Diagnosis not present

## 2023-05-23 DIAGNOSIS — E663 Overweight: Secondary | ICD-10-CM | POA: Diagnosis not present

## 2023-05-23 DIAGNOSIS — Z0000 Encounter for general adult medical examination without abnormal findings: Secondary | ICD-10-CM

## 2023-05-23 LAB — CBC
HCT: 46.8 % (ref 39.0–52.0)
Hemoglobin: 15.2 g/dL (ref 13.0–17.0)
MCHC: 32.4 g/dL (ref 30.0–36.0)
MCV: 87.5 fL (ref 78.0–100.0)
Platelets: 176 10*3/uL (ref 150.0–400.0)
RBC: 5.35 Mil/uL (ref 4.22–5.81)
RDW: 13.9 % (ref 11.5–15.5)
WBC: 3.4 10*3/uL — ABNORMAL LOW (ref 4.0–10.5)

## 2023-05-23 LAB — COMPREHENSIVE METABOLIC PANEL
ALT: 38 U/L (ref 0–53)
AST: 63 U/L — ABNORMAL HIGH (ref 0–37)
Albumin: 4.7 g/dL (ref 3.5–5.2)
Alkaline Phosphatase: 49 U/L (ref 39–117)
BUN: 11 mg/dL (ref 6–23)
CO2: 30 meq/L (ref 19–32)
Calcium: 9.8 mg/dL (ref 8.4–10.5)
Chloride: 106 meq/L (ref 96–112)
Creatinine, Ser: 1.13 mg/dL (ref 0.40–1.50)
GFR: 75.46 mL/min (ref >60.00)
Glucose, Bld: 102 mg/dL — ABNORMAL HIGH (ref 70–99)
Potassium: 4 meq/L (ref 3.5–5.1)
Sodium: 142 meq/L (ref 135–145)
Total Bilirubin: 0.7 mg/dL (ref 0.2–1.2)
Total Protein: 7.1 g/dL (ref 6.0–8.3)

## 2023-05-23 LAB — LIPID PANEL
Cholesterol: 203 mg/dL — ABNORMAL HIGH (ref 0–200)
HDL: 66.3 mg/dL (ref >39.00)
LDL Cholesterol: 115 mg/dL — ABNORMAL HIGH (ref 0–99)
NonHDL: 136.71
Total CHOL/HDL Ratio: 3
Triglycerides: 107 mg/dL (ref 0.0–149.0)
VLDL: 21.4 mg/dL (ref 0.0–40.0)

## 2023-05-23 LAB — TSH: TSH: 2.49 u[IU]/mL (ref 0.35–5.50)

## 2023-05-23 LAB — HEMOGLOBIN A1C: Hgb A1c MFr Bld: 6.1 % (ref 4.6–6.5)

## 2023-05-23 LAB — PSA: PSA: 0.3 ng/mL (ref 0.10–4.00)

## 2023-05-23 MED ORDER — EVOLOCUMAB 140 MG/ML ~~LOC~~ SOAJ
140.0000 mg | SUBCUTANEOUS | 11 refills | Status: DC
  Filled 2023-05-23: qty 2, 28d supply, fill #0

## 2023-05-23 MED ORDER — AMLODIPINE BESYLATE 2.5 MG PO TABS
2.5000 mg | ORAL_TABLET | Freq: Every day | ORAL | 3 refills | Status: DC
  Filled 2023-05-23: qty 90, 90d supply, fill #0

## 2023-06-01 ENCOUNTER — Other Ambulatory Visit (HOSPITAL_BASED_OUTPATIENT_CLINIC_OR_DEPARTMENT_OTHER): Payer: Self-pay

## 2023-06-03 ENCOUNTER — Other Ambulatory Visit (HOSPITAL_BASED_OUTPATIENT_CLINIC_OR_DEPARTMENT_OTHER): Payer: Self-pay

## 2023-06-14 ENCOUNTER — Ambulatory Visit: Payer: 59 | Admitting: Physician Assistant

## 2023-06-14 ENCOUNTER — Other Ambulatory Visit: Payer: Self-pay | Admitting: Physician Assistant

## 2023-06-14 ENCOUNTER — Other Ambulatory Visit (HOSPITAL_BASED_OUTPATIENT_CLINIC_OR_DEPARTMENT_OTHER): Payer: Self-pay

## 2023-06-14 ENCOUNTER — Ambulatory Visit: Payer: 59

## 2023-06-14 VITALS — BP 136/84 | HR 78 | Temp 98.2°F | Ht 73.0 in | Wt 215.6 lb

## 2023-06-14 DIAGNOSIS — J069 Acute upper respiratory infection, unspecified: Secondary | ICD-10-CM | POA: Diagnosis not present

## 2023-06-14 LAB — POC COVID19 BINAXNOW: SARS Coronavirus 2 Ag: NEGATIVE

## 2023-06-14 LAB — POCT INFLUENZA A/B
Influenza A, POC: NEGATIVE
Influenza B, POC: NEGATIVE

## 2023-06-14 MED ORDER — PSEUDOEPHEDRINE-CODEINE-GG 30-10-100 MG/5ML PO SOLN
10.0000 mL | Freq: Every evening | ORAL | 0 refills | Status: DC | PRN
  Filled 2023-06-14: qty 100, 10d supply, fill #0

## 2023-06-14 MED ORDER — GUAIFENESIN-CODEINE 100-10 MG/5ML PO SOLN
10.0000 mL | Freq: Three times a day (TID) | ORAL | 0 refills | Status: DC | PRN
  Filled 2023-06-14: qty 120, 4d supply, fill #0

## 2023-06-14 NOTE — Progress Notes (Signed)
 Patient ID: Russell Ortiz, male    DOB: 26-Feb-1971, 53 y.o.   MRN: [id number]   Assessment & Plan:  Acute URI -     POCT Influenza A/B -     POC COVID-19 BinaxNow  Other orders -     Pseudoephedrine -Codeine -GG; Take 10 mLs by mouth at bedtime as needed for cough.  Dispense: 100 mL; Refill: 0   Assessment and Plan    Upper Respiratory Infection Sinus congestion and coughing, worse at night. No fever, body aches, or other systemic symptoms. Flu and COVID tests negative. Likely viral etiology. -Prescribe Cheratussin AC syrup for cough and congestion relief, to be taken as prescribed. Pt aware of risks vs benefits and possible adverse reactions. -Advise continued use of Mucinex  cough drops and tea for symptomatic relief. -Recommend use of nasal saline rinses and a humidifier to aid in symptom relief. -If symptoms worsen or persist beyond one week, consider need for antibiotics and advise patient to contact the office.         No follow-ups on file.    Subjective:    Chief Complaint  Patient presents with   Cough    Pt in office started feeling poorly since the weekend; pt not tested for Covid; no fever that he is aware of; feeling like sinus infection, cough through the night couldn't rest due to cough; head feeling heavy and feels a lot of congestion in head; states a few days ago was coughing up green but yesterday clear mucus; took OC Mucinex ;     Cough   Discussed the use of AI scribe software for clinical note transcription with the patient, who gave verbal consent to proceed.  History of Present Illness   The patient presents with sinus congestion and a cough that started over the weekend after returning from vacation. He describes the congestion as being 'right up in here,' pointing to his sinus area. He also reports a loss of voice. He denies having body aches, fever, or chills. The cough is particularly bothersome at night, causing him to wake up every hour. He has  been managing his symptoms with tea and Mucinex  cough drops. He denies taking any Tylenol or ibuprofen . He has not been taking Sudafed or similar medications due to concerns about potential dizziness. He denies having asthma or being a smoker.       Past Medical History:  Diagnosis Date   Genital warts    Herpes zoster without complication 05/04/2021   Hyperlipidemia    Mass of left thigh 04/06/2018   PUD (peptic ulcer disease)     Past Surgical History:  Procedure Laterality Date   VASECTOMY  2010    Family History  Problem Relation Age of Onset   Lung cancer Father    Asthma Brother    Stroke Brother    Diabetes Maternal Grandfather    Diabetes Maternal Uncle    Hyperlipidemia Maternal Grandmother    Colon cancer Paternal Uncle    Prostate cancer Paternal Uncle    Stomach cancer Neg Hx    Esophageal cancer Neg Hx    Colon polyps Neg Hx     Social History   Tobacco Use   Smoking status: Some Days    Types: Cigars   Smokeless tobacco: Never  Vaping Use   Vaping status: Never Used  Substance Use Topics   Alcohol use: Yes    Comment: weekly   Drug use: No     Allergies  Allergen  Reactions   Statins Other (See Comments)    mylagia    Review of Systems  Respiratory:  Positive for cough.    NEGATIVE UNLESS OTHERWISE INDICATED IN HPI      Objective:     BP 136/84 (BP Location: Left Arm, Patient Position: Sitting)   Pulse 78   Temp 98.2 F (36.8 C) (Temporal)   Ht 6' 1 (1.854 m)   Wt 215 lb 9.6 oz (97.8 kg)   SpO2 95%   BMI 28.44 kg/m   Wt Readings from Last 3 Encounters:  06/14/23 215 lb 9.6 oz (97.8 kg)  05/23/23 222 lb 6.4 oz (100.9 kg)  04/24/23 225 lb (102.1 kg)    BP Readings from Last 3 Encounters:  06/14/23 136/84  05/23/23 (!) 140/80  04/05/23 139/82     Physical Exam Vitals and nursing note reviewed.  Constitutional:      General: He is not in acute distress.    Appearance: Normal appearance. He is not ill-appearing.  HENT:      Head: Normocephalic.     Right Ear: Tympanic membrane, ear canal and external ear normal.     Left Ear: Tympanic membrane, ear canal and external ear normal.     Nose: Congestion present.     Mouth/Throat:     Mouth: Mucous membranes are moist.     Pharynx: No oropharyngeal exudate or posterior oropharyngeal erythema.  Eyes:     Extraocular Movements: Extraocular movements intact.     Conjunctiva/sclera: Conjunctivae normal.     Pupils: Pupils are equal, round, and reactive to light.  Cardiovascular:     Rate and Rhythm: Normal rate and regular rhythm.     Pulses: Normal pulses.     Heart sounds: Normal heart sounds. No murmur heard. Pulmonary:     Effort: Pulmonary effort is normal. No respiratory distress.     Breath sounds: Normal breath sounds. No wheezing.  Musculoskeletal:     Cervical back: Normal range of motion.  Skin:    General: Skin is warm.  Neurological:     Mental Status: He is alert and oriented to person, place, and time.  Psychiatric:        Mood and Affect: Mood normal.        Behavior: Behavior normal.            Hansel Devan M Story Conti, PA-C

## 2023-06-15 ENCOUNTER — Telehealth: Payer: Self-pay | Admitting: Family Medicine

## 2023-06-15 NOTE — Telephone Encounter (Signed)
 Pt's cough has become for freq. and has moved into is chest.  He took the cough medicine and was still up half the night coughing.  wanted to know what we could do? With the snow and Ice coming in I didn't want to wait until the last min.    we live about 35 mins away from [place]

## 2023-06-15 NOTE — Telephone Encounter (Signed)
 Please see message and advise

## 2023-06-15 NOTE — Telephone Encounter (Signed)
 Spoke to pt told him per Alyssa, Please remind of plan from yesterday:  Unfortunately they need to be patient and let it run its course.   Assessment and Plan     Upper Respiratory Infection  Sinus congestion and coughing, worse at night. No fever, body aches, or other systemic symptoms. Flu and COVID tests negative. Likely viral etiology.  -Prescribe Cheratussin AC syrup for cough and congestion relief, to be taken as prescribed. Pt aware of risks vs benefits and possible adverse reactions.  -Advise continued use of Mucinex  cough drops and tea for symptomatic relief.  -Recommend use of nasal saline rinses and a humidifier to aid in symptom relief.  -If symptoms worsen or persist beyond one week, consider need for antibiotics and advise patient to contact the office.  Pt verbalized understanding.

## 2023-06-16 ENCOUNTER — Ambulatory Visit
Admission: RE | Admit: 2023-06-16 | Discharge: 2023-06-16 | Disposition: A | Discharge status: Home / Self Care | Payer: 59 | Source: Ambulatory Visit | Attending: Internal Medicine | Admitting: Internal Medicine

## 2023-06-16 VITALS — BP 148/77 | HR 78 | Temp 98.2°F | Resp 14

## 2023-06-16 DIAGNOSIS — J0110 Acute frontal sinusitis, unspecified: Secondary | ICD-10-CM | POA: Diagnosis not present

## 2023-06-16 DIAGNOSIS — J209 Acute bronchitis, unspecified: Secondary | ICD-10-CM | POA: Diagnosis not present

## 2023-06-16 MED ORDER — AMOXICILLIN-POT CLAVULANATE 875-125 MG PO TABS: 1.0000 | ORAL_TABLET | Freq: Two times a day (BID) | ORAL | 0 refills | Status: AC

## 2023-06-16 MED ORDER — METHYLPREDNISOLONE ACETATE 80 MG/ML IJ SUSP
40.0000 mg | Freq: Once | INTRAMUSCULAR | Status: AC
  Administered 2023-06-16: 40 mg via INTRAMUSCULAR

## 2023-06-16 MED ORDER — ALBUTEROL SULFATE HFA 108 (90 BASE) MCG/ACT IN AERS: 2.0000 | INHALATION_SPRAY | Freq: Four times a day (QID) | RESPIRATORY_TRACT | 0 refills | Status: DC | PRN

## 2023-06-16 MED ORDER — PROMETHAZINE-DM 6.25-15 MG/5ML PO SYRP: 5.0000 mL | ORAL_SOLUTION | Freq: Four times a day (QID) | ORAL | 0 refills | Status: DC | PRN

## 2023-06-16 MED ORDER — PREDNISONE 20 MG PO TABS: 40.0000 mg | ORAL_TABLET | Freq: Every day | ORAL | 0 refills | Status: AC

## 2023-06-16 MED ORDER — BENZONATATE 200 MG PO CAPS: 200.0000 mg | ORAL_CAPSULE | Freq: Three times a day (TID) | ORAL | 0 refills | Status: DC | PRN

## 2023-06-16 NOTE — ED Provider Notes (Signed)
 [facility]  Note:  This document was prepared using Dragon voice recognition software and may include unintentional dictation errors.  MRN: [id number] DOB: Sep 26, 1970 DATE: 06/16/23   Subjective:  Chief Complaint:  Chief Complaint  Patient presents with   Headache   Cough     HPI: Russell Ortiz is a 53 y.o. male presenting for dry cough for the past 5 days. Patient states on Monday he started with a sore throat and congestion. He followed up with his PCP on Wednesday after developing a cough and per the noted was negative for COVID and flu at that time. He was diagnosed with a viral URI and started on guaifenesin -codeine  cough syrup. He reports taking he cough syrup as directed with no improvement. He reports persistent cough that is keeping him up at night and worsening. He also reports starting with a headache today. No known sick contacts. Denies fever, nausea/vomiting, abdominal pain. Endorses cough, congestion, headache. Presents NAD.  Prior to Admission medications   Medication Sig Start Date End Date Taking? Authorizing Provider  albuterol  (VENTOLIN  HFA) 108 (90 Base) MCG/ACT inhaler Inhale 2 puffs into the lungs every 6 (six) hours as needed for wheezing or shortness of breath. 06/16/23  Yes Fard Borunda P, PA-C  amoxicillin -clavulanate (AUGMENTIN ) 875-125 MG tablet Take 1 tablet by mouth every 12 (twelve) hours for 7 days. 06/16/23 06/23/23 Yes Feliza Diven P, PA-C  benzonatate  (TESSALON ) 200 MG capsule Take 1 capsule (200 mg total) by mouth 3 (three) times daily as needed for cough. 06/16/23  Yes Landen Breeland P, PA-C  predniSONE  (DELTASONE ) 20 MG tablet Take 2 tablets (40 mg total) by mouth daily with breakfast for 5 days. 06/16/23 06/21/23 Yes Tylin Force P, PA-C  promethazine -dextromethorphan (PROMETHAZINE -DM) 6.25-15 MG/5ML syrup Take 5 mLs by mouth 4 (four) times daily as needed for cough. 06/16/23  Yes Salote Weidmann P, PA-C  amLODipine  (NORVASC ) 2.5 MG  tablet Take 1 tablet (2.5 mg total) by mouth daily. 05/23/23   Kuneff, Renee A, DO  fluticasone  (FLONASE ) 50 MCG/ACT nasal spray Place 2 sprays into both nostrils daily. 08/29/22   Catherine, Renee A, DO  metoprolol  tartrate (LOPRESSOR ) 100 MG tablet Take one tablet by mouth two hours prior to CT 03/12/20 04/22/20  Delford Maude BROCKS, MD     Allergies  Allergen Reactions   Statins Other (See Comments)    mylagia    History:   Past Medical History:  Diagnosis Date   Genital warts    Herpes zoster without complication 05/04/2021   Hyperlipidemia    Mass of left thigh 04/06/2018   PUD (peptic ulcer disease)      Past Surgical History:  Procedure Laterality Date   VASECTOMY  2010    Family History  Problem Relation Age of Onset   Lung cancer Father    Asthma Brother    Stroke Brother    Diabetes Maternal Grandfather    Diabetes Maternal Uncle    Hyperlipidemia Maternal Grandmother    Colon cancer Paternal Uncle    Prostate cancer Paternal Uncle    Stomach cancer Neg Hx    Esophageal cancer Neg Hx    Colon polyps Neg Hx     Social History   Tobacco Use   Smoking status: Some Days    Types: Cigars   Smokeless tobacco: Never  Vaping Use   Vaping status: Never Used  Substance Use Topics   Alcohol use: Yes    Comment: weekly   Drug use: No  Review of Systems  Constitutional:  Positive for fatigue. Negative for fever.  HENT:  Positive for congestion, rhinorrhea, sinus pressure, sinus pain and sore throat. Negative for ear pain.   Respiratory:  Positive for cough. Negative for shortness of breath.   Gastrointestinal:  Negative for abdominal pain, nausea and vomiting.  Neurological:  Positive for headaches.     Objective:   Vitals: BP (!) 148/77 (BP Location: Right Arm)   Pulse 78   Temp 98.2 F (36.8 C) (Oral)   Resp 14   SpO2 95%   Physical Exam Constitutional:      General: He is not in acute distress.    Appearance: Normal appearance. He is well-developed  and normal weight. He is not ill-appearing or toxic-appearing.  HENT:     Head: Normocephalic and atraumatic.     Right Ear: Tympanic membrane and ear canal normal.     Left Ear: Tympanic membrane and ear canal normal.     Nose: Rhinorrhea present. Rhinorrhea is clear.     Right Turbinates: Enlarged.     Left Turbinates: Enlarged.     Right Sinus: Frontal sinus tenderness present.     Left Sinus: Frontal sinus tenderness present.     Mouth/Throat:     Pharynx: Oropharynx is clear. Uvula midline. No pharyngeal swelling, oropharyngeal exudate or posterior oropharyngeal erythema.     Tonsils: No tonsillar exudate or tonsillar abscesses.  Cardiovascular:     Rate and Rhythm: Normal rate and regular rhythm.     Heart sounds: Normal heart sounds.  Pulmonary:     Effort: Pulmonary effort is normal.     Breath sounds: Normal breath sounds.     Comments: Clear to auscultation bilaterally  Abdominal:     General: Bowel sounds are normal.     Palpations: Abdomen is soft.     Tenderness: There is no abdominal tenderness.  Skin:    General: Skin is warm and dry.  Neurological:     General: No focal deficit present.     Mental Status: He is alert.  Psychiatric:        Mood and Affect: Mood and affect normal.     Results:  Labs: No results found for this or any previous visit (from the past 24 hours).  Radiology: No results found.   UC Course/Treatments:  Procedures: Procedures   Medications Ordered in UC: Medications  methylPREDNISolone  acetate (DEPO-MEDROL ) injection 40 mg (40 mg Intramuscular Given 06/16/23 0944)     Assessment and Plan :     ICD-10-CM   1. Acute bronchitis, unspecified organism  J20.9     2. Acute non-recurrent frontal sinusitis  J01.10      Acute bronchitis, unspecified organism Afebrile, nontoxic-appearing, NAD. VSS. DDX includes but not limited to: COVID, flu, bronchitis, pneumonia, viral URI Given worsening cough and no improvement with cough  syrup, Augmentin  875mg  BID was prescribed.  Patient was instructed to stop the cough syrup as given to him by his PCP.  He was instructed to start Benzonatate  200mg  TID PRN during the day and Promethazine -DM 5mLs QID PRN at night. He was told not to combine any of the medications due to risk of respiratory depression. He was also prescribed Prednisone  40mg  every day and an Albuterol  inhaler 2 puffs every 6 hours PRN for shortness of breath or wheezing. Methylprednisolone  40 mg IM was given today in office. Strict ED precautions were given and patient verbalized understanding.  Acute sinusitis, unspecified organism Afebrile, nontoxic-appearing, NAD. VSS.  DDX includes but not limited to: COVID, flu, viral URI, sinusitis, allergic rhinitis Given increased headache and frontal sinus tenderness, Augmentin  875mg  BID was prescribed. Methylprednisolone  40 mg IM was given today in office. Strict ED precautions were given and patient verbalized understanding.  ED Discharge Orders          Ordered    amoxicillin -clavulanate (AUGMENTIN ) 875-125 MG tablet  Every 12 hours        06/16/23 0942    benzonatate  (TESSALON ) 200 MG capsule  3 times daily PRN        06/16/23 0942    promethazine -dextromethorphan (PROMETHAZINE -DM) 6.25-15 MG/5ML syrup  4 times daily PRN        06/16/23 0942    albuterol  (VENTOLIN  HFA) 108 (90 Base) MCG/ACT inhaler  Every 6 hours PRN        06/16/23 0942    predniSONE  (DELTASONE ) 20 MG tablet  Daily with breakfast        06/16/23 0942             I have reviewed the PDMP during this encounter.     Basilia Ulanda SQUIBB, PA-C 06/16/23 [id number]

## 2023-06-16 NOTE — ED Triage Notes (Addendum)
 Pt had since Monday cough that is deep esp at night, congestion. Today having headache.  Went to doctor on Wed and was prescribe cough syrup and states not helping. Adds that was tested for Covid and Flu which both were negative.  Hasn't had any medications today.

## 2023-06-16 NOTE — Discharge Instructions (Addendum)
 I have sent several medications to your pharmacy.  The first medication I sent was for Augmentin . This is an antibiotic used to treat upper respiratory infections. Take as directed.   Next, I sent 2 different cough medicines to your pharmacy.  You should stop the cough medicine that was prescribed by your PCP.  Do not combine any of these medications.  I first sent benzonatate .  Take this during the day because it will not cause you any drowsiness.  I also sent Promethazine  DM.  Take this at night as directed because it will cause you to feel sleepy.  Do not take this while you are driving or working.  Once again do not combine these together or take with the cough syrup for your PCP gave you.  Finally, I sent a prescription for prednisone .  This is a steroid that will help with the cough and congestion.  I recommend you start this tomorrow given your injection today in office. You were given an injection (methylprednisolone ) for your symptoms today.  This is also a steroid.  Please take the prednisone  as directed.  You are also provided with an inhaler to use as needed.  Return in 3-4 days if no improvement. It is very important for you to pay attention to any new symptoms or worsening of your current condition. Please go directly to the Emergency Department immediately should you begin to have any of the following symptoms: shortness of breath, chest pain or difficulty breathing.

## 2023-06-20 ENCOUNTER — Ambulatory Visit: Payer: 59

## 2023-06-20 VITALS — BP 140/90 | HR 78

## 2023-06-20 DIAGNOSIS — I10 Essential (primary) hypertension: Secondary | ICD-10-CM

## 2023-06-20 NOTE — Addendum Note (Signed)
 Addended by: Felix Pacini A on: 06/20/2023 12:13 PM   Modules accepted: Orders

## 2023-06-20 NOTE — Progress Notes (Signed)
 Pt advised of results.

## 2023-06-20 NOTE — Progress Notes (Signed)
 Please call pt and have him reschedule a nurse visit in 3-4 weeks.  - please review instructions with him: blood pressure medication in his system at least 2 hours before nurse appt.  - no OTC cold medicines in system or prednisone etc.

## 2023-06-20 NOTE — Progress Notes (Signed)
 Pt here for Blood pressure check per PCP  Pt currently takes: amlodipine  2.5 mg    Pt reports compliance with medication. Pt takes medication around 10 AM. He has not had medication 1 hour before appt time  BP today @ = 142/86; 140/90 HR =78  Pt advised per PCP  Pt is currently on prednisone  and abx for sinus infection which he has take this AM.

## 2023-07-10 ENCOUNTER — Other Ambulatory Visit (HOSPITAL_BASED_OUTPATIENT_CLINIC_OR_DEPARTMENT_OTHER): Payer: Self-pay

## 2023-07-10 ENCOUNTER — Ambulatory Visit (AMBULATORY_SURGERY_CENTER): Payer: 59

## 2023-07-10 VITALS — Ht 73.0 in | Wt 215.0 lb

## 2023-07-10 DIAGNOSIS — Z860100 Personal history of colon polyps, unspecified: Secondary | ICD-10-CM

## 2023-07-10 MED ORDER — SUFLAVE 178.7 G PO SOLR: 1.0000 | ORAL | 0 refills | Status: DC

## 2023-07-10 MED ORDER — SUFLAVE 178.7 G PO SOLR
1.0000 | ORAL | 0 refills | Status: DC
Start: 2023-07-10 — End: 2023-07-10
  Filled 2023-07-10: qty 2, 1d supply, fill #0

## 2023-07-10 NOTE — Progress Notes (Signed)

## 2023-07-11 ENCOUNTER — Ambulatory Visit: Payer: 59

## 2023-07-11 NOTE — Progress Notes (Signed)
Pt here for Blood Pressure Check per Dr. Claiborne Billings  First BP: 140/84  Second BP: 138/82

## 2023-07-14 ENCOUNTER — Other Ambulatory Visit (HOSPITAL_BASED_OUTPATIENT_CLINIC_OR_DEPARTMENT_OTHER): Payer: Self-pay

## 2023-07-27 ENCOUNTER — Encounter: Payer: Self-pay | Admitting: Internal Medicine

## 2023-07-27 ENCOUNTER — Encounter: Payer: Self-pay | Admitting: Certified Registered Nurse Anesthetist

## 2023-07-31 ENCOUNTER — Ambulatory Visit (AMBULATORY_SURGERY_CENTER): Payer: 59 | Admitting: Internal Medicine

## 2023-07-31 ENCOUNTER — Encounter: Payer: Self-pay | Admitting: Internal Medicine

## 2023-07-31 VITALS — BP 142/99 | HR 59 | Temp 98.2°F | Resp 10 | Ht 73.0 in | Wt 215.0 lb

## 2023-07-31 DIAGNOSIS — Z1211 Encounter for screening for malignant neoplasm of colon: Secondary | ICD-10-CM

## 2023-07-31 DIAGNOSIS — Z860100 Personal history of colon polyps, unspecified: Secondary | ICD-10-CM

## 2023-07-31 DIAGNOSIS — I10 Essential (primary) hypertension: Secondary | ICD-10-CM | POA: Diagnosis not present

## 2023-07-31 DIAGNOSIS — Z860101 Personal history of adenomatous and serrated colon polyps: Secondary | ICD-10-CM

## 2023-07-31 MED ORDER — SODIUM CHLORIDE 0.9 % IV SOLN
500.0000 mL | INTRAVENOUS | Status: DC
Start: 2023-07-31 — End: 2023-07-31

## 2023-07-31 NOTE — Op Note (Signed)
 [facility] Patient Name: Russell Ortiz Procedure Date: 07/31/2023 9:03 AM MRN: [id number] Endoscopist: Beverley Fiedler , MD, [id number] Age: 53 Referring MD:  Date of Birth: 06/02/1971 Gender: Male Account #: [id number] Procedure:                Colonoscopy Indications:              High risk colon cancer surveillance: Personal                            history of sessile serrated colon polyps (less than                            10 mm in size) with no dysplasia, Last colonoscopy:                            December 2019 (SSP x 2 at index exam) Medicines:                Monitored Anesthesia Care Procedure:                Pre-Anesthesia Assessment:                           - Prior to the procedure, a History and Physical                            was performed, and patient medications and                            allergies were reviewed. The patient's tolerance of                            previous anesthesia was also reviewed. The risks                            and benefits of the procedure and the sedation                            options and risks were discussed with the patient.                            All questions were answered, and informed consent                            was obtained. Prior Anticoagulants: The patient has                            taken no anticoagulant or antiplatelet agents. ASA                            Grade Assessment: II - A patient with mild systemic                            disease. After reviewing the risks and benefits,  the patient was deemed in satisfactory condition to                            undergo the procedure.                           After obtaining informed consent, the colonoscope                            was passed under direct vision. Throughout the                            procedure, the patient's blood pressure, pulse, and                            oxygen saturations were  monitored continuously. The                            Olympus Scope SN: [id number] was introduced through                            the anus and advanced to the terminal ileum. The                            colonoscopy was performed without difficulty. The                            patient tolerated the procedure well. The quality                            of the bowel preparation was good. The terminal                            ileum, ileocecal valve, appendiceal orifice, and                            rectum were photographed. Scope In: 9:12:37 AM Scope Out: 9:26:39 AM Scope Withdrawal Time: 0 hours 10 minutes 45 seconds  Total Procedure Duration: 0 hours 14 minutes 2 seconds  Findings:                 The digital rectal exam was normal.                           The terminal ileum appeared normal.                           The entire examined colon appeared normal on direct                            and retroflexion views. Complications:            No immediate complications. Estimated Blood Loss:     Estimated blood loss: none. Impression:               - The examined portion of the  ileum was normal.                           - The entire examined colon is normal on direct and                            retroflexion views.                           - No specimens collected. Recommendation:           - Patient has a contact number available for                            emergencies. The signs and symptoms of potential                            delayed complications were discussed with the                            patient. Return to normal activities tomorrow.                            Written discharge instructions were provided to the                            patient.                           - Resume previous diet.                           - Continue present medications.                           - Repeat colonoscopy in 10 years for surveillance. Beverley Fiedler,  MD 07/31/2023 9:28:38 AM This report has been signed electronically.

## 2023-07-31 NOTE — Progress Notes (Signed)
 GASTROENTEROLOGY PROCEDURE H&P NOTE   Primary Care Physician: Natalia Leatherwood, DO    Reason for Procedure:  History of SSP x 2  Plan:    Colonoscopy  Patient is appropriate for endoscopic procedure(s) in the ambulatory ([facility]) setting.  The nature of the procedure, as well as the risks, benefits, and alternatives were carefully and thoroughly reviewed with the patient. Ample time for discussion and questions allowed. The patient understood, was satisfied, and agreed to proceed.     HPI: Russell Ortiz is a 53 y.o. male who presents for surveillance colonoscopy.  Medical history as below.  Tolerated the prep.  No recent chest pain or shortness of breath.  No abdominal pain today.  Past Medical History:  Diagnosis Date   Genital warts    Herpes zoster without complication 05/04/2021   HTN (hypertension)    Hyperlipidemia    Mass of left thigh 04/06/2018   PUD (peptic ulcer disease)     Past Surgical History:  Procedure Laterality Date   VASECTOMY  2010    Prior to Admission medications   Medication Sig Start Date End Date Taking? Authorizing Provider  amLODipine (NORVASC) 2.5 MG tablet Take 1 tablet (2.5 mg total) by mouth daily. 05/23/23  Yes Kuneff, Renee A, DO  Evolocumab (REPATHA SURECLICK) 140 MG/ML SOAJ Inject 140 mg into the skin every 14 (fourteen) days.   Yes [provider]  albuterol (VENTOLIN HFA) 108 (90 Base) MCG/ACT inhaler Inhale 2 puffs into the lungs every 6 (six) hours as needed for wheezing or shortness of breath. Patient not taking: Reported on 07/31/2023 06/16/23   Hermanns, Ashlee P, PA-C  benzonatate (TESSALON) 200 MG capsule Take 1 capsule (200 mg total) by mouth 3 (three) times daily as needed for cough. Patient not taking: Reported on 07/31/2023 06/16/23   Hermanns, Ashlee P, PA-C  fluticasone (FLONASE) 50 MCG/ACT nasal spray Place 2 sprays into both nostrils daily. Patient not taking: Reported on 07/31/2023 08/29/22   Felix Pacini A, DO   Multiple Vitamin (MULTIVITAMIN ADULT PO) Take by mouth daily.    [provider]  PEG 3350-KCl-NaCl-NaSulf-MgSul (SUFLAVE) 178.7 g SOLR Take 1 kit by mouth as directed. 07/10/23   Domanick Cuccia, Carie Caddy, MD  promethazine-dextromethorphan (PROMETHAZINE-DM) 6.25-15 MG/5ML syrup Take 5 mLs by mouth 4 (four) times daily as needed for cough. Patient not taking: Reported on 07/31/2023 06/16/23   Hermanns, Ashlee P, PA-C  metoprolol tartrate (LOPRESSOR) 100 MG tablet Take one tablet by mouth two hours prior to CT 03/12/20 04/22/20  Wendall Stade, MD    Current Outpatient Medications  Medication Sig Dispense Refill   amLODipine (NORVASC) 2.5 MG tablet Take 1 tablet (2.5 mg total) by mouth daily. 90 tablet 3   Evolocumab (REPATHA SURECLICK) 140 MG/ML SOAJ Inject 140 mg into the skin every 14 (fourteen) days.     albuterol (VENTOLIN HFA) 108 (90 Base) MCG/ACT inhaler Inhale 2 puffs into the lungs every 6 (six) hours as needed for wheezing or shortness of breath. (Patient not taking: Reported on 07/31/2023) 18 g 0   benzonatate (TESSALON) 200 MG capsule Take 1 capsule (200 mg total) by mouth 3 (three) times daily as needed for cough. (Patient not taking: Reported on 07/31/2023) 15 capsule 0   fluticasone (FLONASE) 50 MCG/ACT nasal spray Place 2 sprays into both nostrils daily. (Patient not taking: Reported on 07/31/2023) 16 g 3   Multiple Vitamin (MULTIVITAMIN ADULT PO) Take by mouth daily.     PEG 3350-KCl-NaCl-NaSulf-MgSul (SUFLAVE)  178.7 g SOLR Take 1 kit by mouth as directed. 1 each 0   promethazine-dextromethorphan (PROMETHAZINE-DM) 6.25-15 MG/5ML syrup Take 5 mLs by mouth 4 (four) times daily as needed for cough. (Patient not taking: Reported on 07/31/2023) 180 mL 0   Current Facility-Administered Medications  Medication Dose Route Frequency Provider Last Rate Last Admin   0.9 %  sodium chloride infusion  500 mL Intravenous Continuous Rachna Schonberger, Carie Caddy, MD        Allergies as of 07/31/2023 - Review Complete  07/31/2023  Allergen Reaction Noted   Statins Other (See Comments) 04/14/2016    Family History  Problem Relation Age of Onset   Lung cancer Father    Asthma Brother    Stroke Brother    Diabetes Maternal Grandfather    Diabetes Maternal Uncle    Hyperlipidemia Maternal Grandmother    Colon cancer Paternal Uncle    Prostate cancer Paternal Uncle    Stomach cancer Neg Hx    Esophageal cancer Neg Hx    Colon polyps Neg Hx     Social History   Socioeconomic History   Marital status: Married    Spouse name: Not on file   Number of children: 1   Years of education: 12   Highest education level: GED or equivalent  Occupational History   Occupation: truck Hospital doctor  Tobacco Use   Smoking status: Some Days    Types: Cigars   Smokeless tobacco: Never  Vaping Use   Vaping status: Never Used  Substance and Sexual Activity   Alcohol use: Yes    Comment: weekly   Drug use: No   Sexual activity: Yes    Partners: Female    Comment: married  Other Topics Concern   Not on file  Social History Narrative   Married. 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    High school grad.    Wears seat belt. Exercises 3x or greater a week.    Former Smoker. Quit 1999, Occasional ETOH, no recreational drugs.    Drinks caffeine beverages. Uses herbal remedies. Takes a multivitamin.    Smoker alarm in the home.    Feels safe in his relationships.    Right Handed   Lives in one story home      Social Drivers of Health   Financial Resource Strain: Low Risk  (06/14/2023)   Overall Financial Resource Strain (CARDIA)    Difficulty of Paying Living Expenses: Not hard at all  Food Insecurity: Patient Declined (06/14/2023)   Hunger Vital Sign    Worried About Running Out of Food in the Last Year: Patient declined    Ran Out of Food in the Last Year: Patient declined  Transportation Needs: Patient Declined (06/14/2023)   PRAPARE - Administrator, Civil Service  (Medical): Patient declined    Lack of Transportation (Non-Medical): Patient declined  Physical Activity: Insufficiently Active (06/14/2023)   Exercise Vital Sign    Days of Exercise per Week: 4 days    Minutes of Exercise per Session: 10 min  Stress: No Stress Concern Present (06/14/2023)   [employer] of Occupational Health - Occupational Stress Questionnaire    Feeling of Stress : Not at all  Social Connections: Unknown (06/14/2023)   Social Connection and Isolation Panel [NHANES]    Frequency of Communication with Friends and Family: Twice a week    Frequency of Social Gatherings with Friends and Family: Once a week    Attends Religious Services:  Patient declined    Active Member of Clubs or Organizations: No    Attends Engineer, structural: Not on file    Marital Status: Married  Catering manager Violence: Not on file    Physical Exam: Vital signs in last 24 hours: @BP  (!) 167/101   Pulse 61   Temp 98.2 F (36.8 C)   Resp 12   Ht 6\' 1"  (1.854 m)   Wt 215 lb (97.5 kg)   SpO2 99%   BMI 28.37 kg/m  GEN: NAD EYE: Sclerae anicteric ENT: MMM CV: Non-tachycardic Pulm: CTA b/l GI: Soft, NT/ND NEURO:  Alert & Oriented x 3   Erick Blinks, MD [facility]  07/31/2023 9:05 AM

## 2023-07-31 NOTE — Patient Instructions (Signed)
 Discharge instructions given. Normal exam. Resume previous medications. YOU HAD AN ENDOSCOPIC PROCEDURE TODAY AT THE [facility]:   Refer to the procedure report that was given to you for any specific questions about what was found during the examination.  If the procedure report does not answer your questions, please call your gastroenterologist to clarify.  If you requested that your care partner not be given the details of your procedure findings, then the procedure report has been included in a sealed envelope for you to review at your convenience later.  YOU SHOULD EXPECT: Some feelings of bloating in the abdomen. Passage of more gas than usual.  Walking can help get rid of the air that was put into your GI tract during the procedure and reduce the bloating. If you had a lower endoscopy (such as a colonoscopy or flexible sigmoidoscopy) you may notice spotting of blood in your stool or on the toilet paper. If you underwent a bowel prep for your procedure, you may not have a normal bowel movement for a few days.  Please Note:  You might notice some irritation and congestion in your nose or some drainage.  This is from the oxygen used during your procedure.  There is no need for concern and it should clear up in a day or so.  SYMPTOMS TO REPORT IMMEDIATELY:  Following lower endoscopy (colonoscopy or flexible sigmoidoscopy):  Excessive amounts of blood in the stool  Significant tenderness or worsening of abdominal pains  Swelling of the abdomen that is new, acute  Fever of 100F or higher   For urgent or emergent issues, a gastroenterologist can be reached at any hour by calling [phone]. Do not use MyChart messaging for urgent concerns.    DIET:  We do recommend a small meal at first, but then you may proceed to your regular diet.  Drink plenty of fluids but you should avoid alcoholic beverages for 24 hours.  ACTIVITY:  You should plan to take it easy for the rest of  today and you should NOT DRIVE or use heavy machinery until tomorrow (because of the sedation medicines used during the test).    FOLLOW UP: Our staff will call the number listed on your records the next business day following your procedure.  We will call around 7:15- 8:00 am to check on you and address any questions or concerns that you may have regarding the information given to you following your procedure. If we do not reach you, we will leave a message.     If any biopsies were taken you will be contacted by phone or by letter within the next 1-3 weeks.  Please call us at [phone] if you have not heard about the biopsies in 3 weeks.    SIGNATURES/CONFIDENTIALITY: You and/or your care partner have signed paperwork which will be entered into your electronic medical record.  These signatures attest to the fact that that the information above on your After Visit Summary has been reviewed and is understood.  Full responsibility of the confidentiality of this discharge information lies with you and/or your care-partner.

## 2023-07-31 NOTE — Progress Notes (Signed)
 Pt states no changes to patient health since pre-admission visit.

## 2023-07-31 NOTE — Progress Notes (Signed)
 Report given to PACU, vss

## 2023-08-01 ENCOUNTER — Telehealth: Payer: Self-pay | Admitting: *Deleted

## 2023-08-01 NOTE — Telephone Encounter (Signed)
  Follow up Call-     07/31/2023    8:38 AM  Call back number  Post procedure Call Back phone  # [phone]  Permission to leave phone message Yes     Patient questions:  Do you have a fever, pain , or abdominal swelling? No. Pain Score  0 *  Have you tolerated food without any problems? Yes.    Have you been able to return to your normal activities? Yes.    Do you have any questions about your discharge instructions: Diet   No. Medications  No. Follow up visit  No.  Do you have questions or concerns about your Care? No.  Actions: * If pain score is 4 or above: No action needed, pain <4.

## 2024-03-03 ENCOUNTER — Ambulatory Visit
Admission: RE | Admit: 2024-03-03 | Discharge: 2024-03-03 | Disposition: A | Discharge status: Home / Self Care | Source: Ambulatory Visit | Attending: Emergency Medicine | Admitting: Emergency Medicine

## 2024-03-03 VITALS — BP 142/90 | HR 70 | Temp 98.0°F | Resp 18 | Ht 73.0 in | Wt 215.0 lb

## 2024-03-03 DIAGNOSIS — S46912A Strain of unspecified muscle, fascia and tendon at shoulder and upper arm level, left arm, initial encounter: Secondary | ICD-10-CM | POA: Diagnosis not present

## 2024-03-03 MED ORDER — IBUPROFEN 800 MG PO TABS: 800.0000 mg | ORAL_TABLET | Freq: Three times a day (TID) | ORAL | 0 refills | Status: DC

## 2024-03-03 MED ORDER — DEXAMETHASONE SODIUM PHOSPHATE 10 MG/ML IJ SOLN
10.0000 mg | Freq: Once | INTRAMUSCULAR | Status: AC
  Administered 2024-03-03: 10 mg via INTRAMUSCULAR

## 2024-03-03 NOTE — Discharge Instructions (Signed)
 We have given you a steroid, this should kick in in the next 30 minutes or so to help reduce pain and inflammation.  Continue heating, icing and resting the shoulder.  If the pain persists over the next few days you can take 800 milligrams of ibuprofen  every 8 hours as needed.  Follow-up with sports medicine if pain persist.  Return to clinic for new or urgent symptoms.

## 2024-03-03 NOTE — ED Provider Notes (Addendum)
 Russell Ortiz    CSN: [id number] Arrival date & time: 03/03/24  0841      History   Chief Complaint Chief Complaint  Patient presents with   Shoulder Pain    Entered by patient    HPI Russell Ortiz is a 53 y.o. male.   Patient presents to clinic over concern of a left sided posterior and lateral shoulder pain.  Noticed the shoulder pain starting after his wife had fallen asleep on his shoulder so he kind of jerked it away from her.  Has not had any falls, trauma or known injury.  He has been taking Tylenol and Advil  as well as topical pain relief.  Feels like the pain is just worsened over the past week.  Pain is triggered with range of motion and certain sleeping positions.  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  Has not any numbness, tingling or weakness.  Has not had previous injuries or surgeries to the shoulder.  Full range of motion with some discomfort.   The history is provided by the patient and medical records.  Shoulder Pain   Past Medical History:  Diagnosis Date   Genital warts    Herpes zoster without complication 05/04/2021   HTN (hypertension)    Hyperlipidemia    Mass of left thigh 04/06/2018   PUD (peptic ulcer disease)     Patient Active Problem List   Diagnosis Date Noted   Primary hypertension 05/23/2023   Seborrheic dermatitis of scalp 07/12/2021   Cervical radiculopathy 04/20/2018   Neutropenia 04/12/2017   Overweight (BMI 25.0-29.9) 11/28/2016   Elevated hemoglobin A1c 08/25/2016   Elevated liver function tests 03/12/2015   Hyperlipemia 03/12/2015    Past Surgical History:  Procedure Laterality Date   VASECTOMY  2010       Home Medications    Prior to Admission medications   Medication Sig Start Date End Date Taking? Authorizing Provider  albuterol  (VENTOLIN  HFA) 108 (90 Base) MCG/ACT inhaler Inhale 2 puffs into the  lungs every 6 (six) hours as needed for wheezing or shortness of breath. Patient not taking: Reported on 07/31/2023 06/16/23   Hermanns, Ashlee P, PA-C  amLODipine  (NORVASC ) 2.5 MG tablet Take 1 tablet (2.5 mg total) by mouth daily. 05/23/23   Kuneff, Renee A, DO  benzonatate  (TESSALON ) 200 MG capsule Take 1 capsule (200 mg total) by mouth 3 (three) times daily as needed for cough. Patient not taking: Reported on 07/31/2023 06/16/23   Hermanns, Ashlee P, PA-C  Evolocumab  (REPATHA  SURECLICK) 140 MG/ML SOAJ Inject 140 mg into the skin every 14 (fourteen) days.    [provider]  fluticasone  (FLONASE ) 50 MCG/ACT nasal spray Place 2 sprays into both nostrils daily. Patient not taking: Reported on 07/31/2023 08/29/22   Kuneff, Renee A, DO  Multiple Vitamin (MULTIVITAMIN ADULT PO) Take by mouth daily.    [provider]  PEG 3350 -KCl-NaCl-NaSulf-MgSul (SUFLAVE ) 178.7 g SOLR Take 1 kit by mouth as directed. 07/10/23   Pyrtle, Gordy HERO, MD  promethazine -dextromethorphan (PROMETHAZINE -DM) 6.25-15 MG/5ML syrup Take 5 mLs by mouth 4 (four) times daily as needed for cough. Patient not taking: Reported on 07/31/2023 06/16/23   Hermanns, Ashlee P, PA-C  metoprolol  tartrate (LOPRESSOR ) 100 MG tablet Take one tablet by mouth two hours prior to CT 03/12/20 04/22/20  Delford Maude BROCKS, MD    Family  History Family History  Problem Relation Age of Onset   Lung cancer Father    Asthma Brother    Stroke Brother    Diabetes Maternal Grandfather    Diabetes Maternal Uncle    Hyperlipidemia Maternal Grandmother    Colon cancer Paternal Uncle    Prostate cancer Paternal Uncle    Stomach cancer Neg Hx    Esophageal cancer Neg Hx    Colon polyps Neg Hx     Social History Social History   Tobacco Use   Smoking status: Some Days    Types: Cigars   Smokeless tobacco: Never  Vaping Use   Vaping status: Never Used  Substance Use Topics   Alcohol use: Yes    Comment: weekends   Drug use: No      Allergies   Statins   Review of Systems Review of Systems  Per HPI  Physical Exam Triage Vital Signs ED Triage Vitals  Encounter Vitals Group     BP 03/03/24 0855 (!) 142/90     Girls Systolic BP Percentile --      Girls Diastolic BP Percentile --      Boys Systolic BP Percentile --      Boys Diastolic BP Percentile --      Pulse Rate 03/03/24 0855 70     Resp 03/03/24 0855 18     Temp 03/03/24 0855 98 F (36.7 C)     Temp Source 03/03/24 0855 Oral     SpO2 03/03/24 0855 94 %     Weight 03/03/24 0852 215 lb (97.5 kg)     Height 03/03/24 0852 6' 1 (1.854 m)     Head Circumference --      Peak Flow --      Pain Score 03/03/24 0851 7     Pain Loc --      Pain Education --      Exclude from Growth Chart --    No data found.  Updated Vital Signs BP (!) 142/90 (BP Location: Right Arm) Comment: no longer taking BP meds patient choice  Pulse 70   Temp 98 F (36.7 C) (Oral)   Resp 18   Ht 6' 1 (1.854 m)   Wt 215 lb (97.5 kg)   SpO2 94%   BMI 28.37 kg/m   Visual Acuity Right Eye Distance:   Left Eye Distance:   Bilateral Distance:    Right Eye Near:   Left Eye Near:    Bilateral Near:     Physical Exam Vitals and nursing note reviewed.  Constitutional:      Appearance: Normal appearance.  HENT:     Head: Normocephalic and atraumatic.     Right Ear: External ear normal.     Left Ear: External ear normal.     Nose: Nose normal.     Mouth/Throat:     Mouth: Mucous membranes are moist.  Eyes:     Conjunctiva/sclera: Conjunctivae normal.  Cardiovascular:     Rate and Rhythm: Normal rate.  Pulmonary:     Effort: Pulmonary effort is normal. No respiratory distress.  Musculoskeletal:        General: Tenderness present. No swelling, deformity or signs of injury.     Left shoulder: Tenderness present. No swelling or deformity. Normal range of motion. Normal strength. Normal pulse.     Comments: Left shoulder with full range of motion, some activities  trigger discomfort.  Discomfort to the scapular and lateral shoulder palpation.  Skin:  General: Skin is warm and dry.  Neurological:     General: No focal deficit present.     Mental Status: He is alert and oriented to person, place, and time.  Psychiatric:        Mood and Affect: Mood normal.        Behavior: Behavior normal. Behavior is cooperative.      Ortiz Treatments / Results  Labs (all labs ordered are listed, but only abnormal results are displayed) Labs Reviewed - No data to display  EKG   Radiology No results found.  Procedures Procedures (including critical care time)  Medications Ordered in Ortiz Medications  dexamethasone (DECADRON) injection 10 mg (10 mg Intramuscular Given 03/03/24 0913)    Initial Impression / Assessment and Plan / Ortiz Course  I have reviewed the triage vital signs and the nursing notes.  Pertinent labs & imaging results that were available during my care of the patient were reviewed by me and considered in my medical decision making (see chart for details).  Vitals and triage reviewed, patient is hemodynamically stable.  Left shoulder with full range of motion with mild discomfort.  Muscular tenderness to palpation.  Atraumatic, without deformity, swelling or bruising.  Imaging deferred at this time.  Negative empty can testing, lower suspicion for rotator cuff etiology.  Suspect muscular strain with the jerking movement pulling away from his wife.  Offered muscle relaxers, these have not worked well for him in the past because prolonged drowsiness.  Will trial IM steroid.  Encouraged sports medicine follow-up if symptoms persist.  Discussed not taking anti-inflammatories over the next few days, since he has taken IM steroid here in clinic.  Plan of care, follow-up care return precautions given, no questions at this time.     Final Clinical Impressions(s) / Ortiz Diagnoses   Final diagnoses:  Muscle strain of left shoulder region, initial  encounter     Discharge Instructions      We have given you a steroid, this should kick in in the next 30 minutes or so to help reduce pain and inflammation.  Continue heating, icing and resting the shoulder.  If the pain persists over the next few days you can take 800 milligrams of ibuprofen  every 8 hours as needed.  Follow-up with sports medicine if pain persist.  Return to clinic for new or urgent symptoms.    ED Prescriptions   None    PDMP not reviewed this encounter.   Dreama, Sahra Converse  N, FNP 03/03/24 0915    Dreama, Pink Maye  N, FNP 03/03/24 [phone]

## 2024-03-03 NOTE — ED Triage Notes (Signed)
 Patient states one week of left shoulder pain with no known injury or heavy lifting.  OTC Tylenol and Advil , roll on pain relief hasn't been helpful

## 2024-05-02 ENCOUNTER — Other Ambulatory Visit: Payer: Self-pay

## 2024-05-02 ENCOUNTER — Emergency Department (HOSPITAL_BASED_OUTPATIENT_CLINIC_OR_DEPARTMENT_OTHER)
Admission: EM | Admit: 2024-05-02 | Discharge: 2024-05-02 | Disposition: A | Discharge status: Home / Self Care | Attending: Emergency Medicine | Admitting: Emergency Medicine

## 2024-05-02 ENCOUNTER — Encounter (HOSPITAL_BASED_OUTPATIENT_CLINIC_OR_DEPARTMENT_OTHER): Payer: Self-pay

## 2024-05-02 DIAGNOSIS — Z79899 Other long term (current) drug therapy: Secondary | ICD-10-CM | POA: Diagnosis not present

## 2024-05-02 DIAGNOSIS — R059 Cough, unspecified: Secondary | ICD-10-CM | POA: Diagnosis not present

## 2024-05-02 DIAGNOSIS — J069 Acute upper respiratory infection, unspecified: Secondary | ICD-10-CM | POA: Diagnosis not present

## 2024-05-02 DIAGNOSIS — B9789 Other viral agents as the cause of diseases classified elsewhere: Secondary | ICD-10-CM | POA: Diagnosis not present

## 2024-05-02 DIAGNOSIS — I10 Essential (primary) hypertension: Secondary | ICD-10-CM | POA: Insufficient documentation

## 2024-05-02 LAB — RESP PANEL BY RT-PCR (RSV, FLU A&B, COVID)  RVPGX2
Influenza A by PCR: NEGATIVE
Influenza B by PCR: NEGATIVE
Resp Syncytial Virus by PCR: NEGATIVE
SARS Coronavirus 2 by RT PCR: NEGATIVE

## 2024-05-02 NOTE — Discharge Instructions (Signed)
 You appear to have an upper respiratory infection (URI). An upper respiratory tract infection, or cold, is a viral infection of the air passages leading to the lungs. It should improve gradually after 5-7 days. You may have a lingering cough that lasts for 2- 4 weeks after the infection.  Your illness is contagious and can be spread to others. It cannot be cured by antibiotics or other medicines. Take basic precautions such as washing your hands often, covering your mouth when you cough or sneeze, and avoiding public places where you could spread your illness to others.   Your flu, covid, and RSV test were negative today   Home care instructions:  You can take Tylenol  and/or Ibuprofen as directed on the packaging for fever reduction and pain relief.    For cough: honey 1/2 to 1 teaspoon (you can dilute the honey in water or another fluid).  You can also use guaifenesin and dextromethorphan for cough which are over-the-counter medications. You can use a humidifier for chest congestion and cough.  If you don't have a humidifier, you can sit in the bathroom with the hot shower running.      For sore throat: try warm salt water gargles, cepacol lozenges, throat spray, warm tea or water with lemon/honey, popsicles or ice, or OTC cold relief medicine for throat discomfort.    For congestion: Flonase (Fluticasone) 1-2 sprays in each nostril daily. This is an over the counter medication.    It is important to stay hydrated: drink plenty of fluids (water, gatorade/powerade/pedialyte, juices, or teas) to keep your throat moisturized and help further relieve irritation/discomfort.   Follow-up instructions: Please follow-up with your primary care provider for further evaluation of your symptoms if you are not feeling better within the next 5 days.   Return instructions:  Please return to the Emergency Department if you experience worsening symptoms.  RETURN IMMEDIATELY IF you develop shortness of breath,  confusion or altered mental status, a new rash, become dizzy, faint, or poorly responsive, or are unable to be cared for at home. Please return if you have persistent vomiting and cannot keep down fluids or develop a fever that is not controlled by tylenol  or motrin.   Please return if you have any other emergent concerns.

## 2024-05-02 NOTE — ED Provider Notes (Signed)
 [place] EMERGENCY DEPARTMENT AT [facility] Provider Note   CSN: [id number] Arrival date & time: 05/02/24  9061     Patient presents with: URI   Russell Ortiz is a 53 y.o. male with history of hypertension, hyperlipidemia, presents with concern for dry cough, congestion, left ear pain, sore throat, and frontal headache that has been ongoing for the past 3 days.  He denies any shortness of breath or chest pain.  Denies any fever or chills.  He denies any difficulties with swallowing.  Reports that his left ear pain is worse when the ear hits his ear.  Denies any drainage from the ear.  No nausea, vomiting, diarrhea, or abdominal pain.    URI Presenting symptoms: congestion and cough        Prior to Admission medications   Medication Sig Start Date End Date Taking? Authorizing Provider  albuterol  (VENTOLIN  HFA) 108 (90 Base) MCG/ACT inhaler Inhale 2 puffs into the lungs every 6 (six) hours as needed for wheezing or shortness of breath. Patient not taking: Reported on 07/31/2023 06/16/23   Hermanns, Ashlee P, PA-C  amLODipine  (NORVASC ) 2.5 MG tablet Take 1 tablet (2.5 mg total) by mouth daily. 05/23/23   Kuneff, Renee A, DO  benzonatate  (TESSALON ) 200 MG capsule Take 1 capsule (200 mg total) by mouth 3 (three) times daily as needed for cough. Patient not taking: Reported on 07/31/2023 06/16/23   Hermanns, Ashlee P, PA-C  Evolocumab  (REPATHA  SURECLICK) 140 MG/ML SOAJ Inject 140 mg into the skin every 14 (fourteen) days.    [provider]  fluticasone  (FLONASE ) 50 MCG/ACT nasal spray Place 2 sprays into both nostrils daily. Patient not taking: Reported on 07/31/2023 08/29/22   Catherine Fuller A, DO  ibuprofen  (ADVIL ) 800 MG tablet Take 1 tablet (800 mg total) by mouth 3 (three) times daily. 03/03/24   Dreama, [place]  N, FNP  Multiple Vitamin (MULTIVITAMIN ADULT PO) Take by mouth daily.    [provider]  PEG 3350 -KCl-NaCl-NaSulf-MgSul (SUFLAVE ) 178.7 g SOLR Take 1  kit by mouth as directed. 07/10/23   Pyrtle, Gordy HERO, MD  promethazine -dextromethorphan (PROMETHAZINE -DM) 6.25-15 MG/5ML syrup Take 5 mLs by mouth 4 (four) times daily as needed for cough. Patient not taking: Reported on 07/31/2023 06/16/23   Hermanns, Ashlee P, PA-C  metoprolol  tartrate (LOPRESSOR ) 100 MG tablet Take one tablet by mouth two hours prior to CT 03/12/20 04/22/20  Delford Maude BROCKS, MD    Allergies: Statins    Review of Systems  HENT:  Positive for congestion.   Respiratory:  Positive for cough.     Updated Vital Signs BP (!) 156/81   Pulse 77   Temp 99 F (37.2 C) (Oral)   Resp 18   SpO2 94%   Physical Exam Vitals and nursing note reviewed.  Constitutional:      General: He is not in acute distress.    Appearance: He is well-developed.  HENT:     Head: Normocephalic and atraumatic.     Right Ear: Tympanic membrane, ear canal and external ear normal. There is no impacted cerumen.     Left Ear: Tympanic membrane and ear canal normal. There is no impacted cerumen.     Nose: Congestion present.     Mouth/Throat:     Pharynx: No oropharyngeal exudate or posterior oropharyngeal erythema.     Comments: Swallowing without difficulty No dental or peritonsillar abscesses Able to open mouth fully, no trismus Eyes:     Conjunctiva/sclera: Conjunctivae normal.  Neck:     Comments: No nuchal rigidity, moves neck without difficulty  Neck is soft and supple without overlying skin change Cardiovascular:     Rate and Rhythm: Normal rate and regular rhythm.     Heart sounds: No murmur heard. Pulmonary:     Effort: Pulmonary effort is normal. No respiratory distress.     Breath sounds: Normal breath sounds.  Abdominal:     Palpations: Abdomen is soft.     Tenderness: There is no abdominal tenderness.  Musculoskeletal:        General: No swelling.     Cervical back: Normal range of motion and neck supple. No rigidity.  Lymphadenopathy:     Cervical: No cervical adenopathy.   Skin:    General: Skin is warm and dry.     Capillary Refill: Capillary refill takes less than 2 seconds.  Neurological:     Mental Status: He is alert.  Psychiatric:        Mood and Affect: Mood normal.     (all labs ordered are listed, but only abnormal results are displayed) Labs Reviewed  RESP PANEL BY RT-PCR (RSV, FLU A&B, COVID)  [id number]    EKG: None  Radiology: No results found.   Procedures   Medications Ordered in the ED - No data to display                                  Medical Decision Making    Differential diagnosis includes but is not limited to COVID, flu, RSV, viral URI, strep pharyngitis, viral pharyngitis, allergic rhinitis, pneumonia, bronchitis   ED Course:  Upon initial evaluation, patient is well-appearing, no acute distress.  Normal vital signs.  Labs Ordered: I Ordered, and personally interpreted labs.  The pertinent results include:   COVID, flu, RSV negative  Medications Given: None  Upon re-evaluation, patient remains well-appearing with stable vitals.  Tympanic membrane bilaterally without erythema or bulging, no clinical concern for otitis media.  No erythema or edema of the external auditory canal bilaterally, no concern for otitis externa.  He does not have any significant erythema of the posterior oropharynx, no tonsillar exudate, Centor score 0, low concern for strep pharyngitis.  No signs of tonsillar abscess.  Swallowing without difficulty.  No trismus.  Lungs clear to auscultation bilaterally, no fevers, low concern for pneumonia at this time.  He does report a frontal headache, but no concerning symptoms such as neck pain, fever or nuchal rigidity to suggest meningitis.  His COVID, flu, and RSV testing is negative.  Given his symptoms of dry cough, congestion, ear pressure, feel symptoms are consistent with URI.  Patient is stable and appropriate for discharge home    Impression: Viral URI  Disposition:  Discharged home  with instructions to use over-the-counter medications as needed for symptom control.  Follow-up with PCP if symptoms not improving within the next 5 days.  He states that he is not taking his blood pressure medication as prescribed.  We discussed that his blood pressure was elevated today and he should restart his medications as prescribed by his PCP and follow-up closely with his PCP for continued monitoring. Return precautions given and patient verbalized understanding.    This chart was dictated using voice recognition software, Dragon. Despite the best efforts of this provider to proofread and correct errors, errors may still occur which can change documentation meaning.  Final diagnoses:  URI with cough and congestion    ED Discharge Orders     None          Veta Palma, [place] 05/02/24 1101    Yolande Lamar BROCKS, MD 05/02/24 1308

## 2024-05-02 NOTE — ED Triage Notes (Signed)
 He c/o congested cough since yesterday.

## 2024-05-12 ENCOUNTER — Ambulatory Visit
Admission: RE | Admit: 2024-05-12 | Discharge: 2024-05-12 | Disposition: A | Discharge status: Home / Self Care | Source: Ambulatory Visit | Attending: Emergency Medicine

## 2024-05-12 ENCOUNTER — Other Ambulatory Visit: Payer: Self-pay

## 2024-05-12 VITALS — BP 142/91 | HR 74 | Resp 16

## 2024-05-12 DIAGNOSIS — J00 Acute nasopharyngitis [common cold]: Secondary | ICD-10-CM

## 2024-05-12 DIAGNOSIS — R052 Subacute cough: Secondary | ICD-10-CM | POA: Diagnosis not present

## 2024-05-12 DIAGNOSIS — R0982 Postnasal drip: Secondary | ICD-10-CM

## 2024-05-12 MED ORDER — LEVOCETIRIZINE DIHYDROCHLORIDE 5 MG PO TABS: 5.0000 mg | ORAL_TABLET | Freq: Every evening | ORAL | 2 refills | Status: AC

## 2024-05-12 MED ORDER — FLUTICASONE PROPIONATE 50 MCG/ACT NA SUSP: 1.0000 | Freq: Every day | NASAL | 2 refills | Status: AC

## 2024-05-12 MED ORDER — IPRATROPIUM BROMIDE 0.06 % NA SOLN: 2.0000 | Freq: Three times a day (TID) | NASAL | 1 refills | Status: AC

## 2024-05-12 NOTE — Discharge Instructions (Signed)
 Your symptoms and my physical exam findings are concerning for exacerbation of your underlying allergies.                   Please read below to learn more about the medications, dosages and frequencies that I recommend to help alleviate your symptoms and to get you feeling better soon:                      Xyzal  (levocetirizine): This is an excellent second-generation antihistamine that helps to reduce respiratory inflammatory response to environmental allergens.  In some patients, this medication can cause daytime sleepiness so I recommend that you take 1 tablet daily at bedtime.                                 Flonase  (fluticasone ): This is a steroid nasal spray that used once daily, 1 spray in each nare.  This works best when used on a daily basis. This medication does not work well if it is only used when you think you need it.  After 3 to 5 days of use, you will notice significant reduction of the inflammation and mucus production that is currently being caused by exposure to allergens, whether seasonal or environmental.  The most common side effect of this medication is nosebleeds.  If you experience a nosebleed, please discontinue use for 1 week, then feel free to resume.  If you find that your insurance will not pay for this medication, please consider a different nasal steroids such as Nasonex (mometasone), or Nasacort  (triamcinolone ).       Atrovent  (ipratropium): This is an excellent nasal decongestant spray that will not cause rebound congestion.  Please instill 2 sprays into each nare after using the nasal steroid and repeat 2 more times throughout the day.  I have added this nasal spray to the nasal steroid because nasal steroids can take several days to reach full effect.  Once you find that you are forgetting to use the spray more often than you remember to use it, you will know that you no longer need it.           If symptoms have not meaningfully improved in the next 5 to 7 days, please  return for repeat evaluation or follow-up with your regular provider.  If symptoms have worsened in the next 3 to 5 days, please return for repeat evaluation or follow-up with your regular provider.        Thank you for visiting [facility] today.  We appreciate the opportunity to participate in your care.

## 2024-05-12 NOTE — ED Triage Notes (Addendum)
 States was seen at [facility] ED on [facility], and was diagnosed with URI and told to take OTC meds. States has not gotten any better, and feels his cough is getting deeper. C/O dyspnea sometimes. No known fevers. Also c/o facial pressure. Has taken Theraflu, Dimetapp, Mucinex , Mucinex  Cough, tea without relief.

## 2024-05-12 NOTE — ED Provider Notes (Signed)
 [facility]    CSN: [id number] Arrival date & time: 05/12/24  1022    HISTORY   Chief Complaint  Patient presents with   Cough   HPI Russell Ortiz is a pleasant, 53 y.o. male who presents to urgent care today. Patient states he was diagnosed with an upper respiratory infection 10 days ago and does not think he has gotten any better.  Patient states he feels that his cough is getting worse and that he is also coughing up brown stuff 3 times a day.  Patient states sometimes he feels short of breath.  Patient denies fever, body aches, chills, nausea, vomiting, diarrhea, sore throat.  Patient endorses facial pressure/pain around his nose that does not radiate.  Patient states he has been taking TheraFlu, Dimetapp, Mucinex , Mucinex  cough and drinking tea without resolution of his symptoms.  Patient denies history of allergies and asthma.  Per EMR, patient has been prescribed albuterol , antihistamines and nasal steroid sprays in the past.  The history is provided by the patient.  Cough  Past Medical History:  Diagnosis Date   Genital warts    Herpes zoster without complication 05/04/2021   HTN (hypertension)    Hyperlipidemia    Mass of left thigh 04/06/2018   PUD (peptic ulcer disease)    Patient Active Problem List   Diagnosis Date Noted   Primary hypertension 05/23/2023   Seborrheic dermatitis of scalp 07/12/2021   Cervical radiculopathy 04/20/2018   Neutropenia 04/12/2017   Overweight (BMI 25.0-29.9) 11/28/2016   Elevated hemoglobin A1c 08/25/2016   Elevated liver function tests 03/12/2015   Hyperlipemia 03/12/2015   Past Surgical History:  Procedure Laterality Date   VASECTOMY  2010    Home Medications    Prior to Admission medications   Medication Sig Start Date End Date Taking? Authorizing Provider  amLODipine  (NORVASC ) 2.5 MG tablet Take 1 tablet (2.5 mg total) by mouth daily. 05/23/23  Yes Kuneff, Renee A, DO  Evolocumab  (REPATHA  SURECLICK) 140 MG/ML  SOAJ Inject 140 mg into the skin every 14 (fourteen) days.   Yes [provider]  Multiple Vitamin (MULTIVITAMIN ADULT PO) Take by mouth daily.   Yes [provider]  albuterol  (VENTOLIN  HFA) 108 (90 Base) MCG/ACT inhaler Inhale 2 puffs into the lungs every 6 (six) hours as needed for wheezing or shortness of breath. Patient not taking: Reported on 07/10/2023 06/16/23   Hermanns, Ashlee P, PA-C  benzonatate  (TESSALON ) 200 MG capsule Take 1 capsule (200 mg total) by mouth 3 (three) times daily as needed for cough. Patient not taking: Reported on 07/10/2023 06/16/23   Hermanns, Ashlee P, PA-C  fluticasone  (FLONASE ) 50 MCG/ACT nasal spray Place 2 sprays into both nostrils daily. Patient not taking: Reported on 07/31/2023 08/29/22   Catherine Fuller A, DO  ibuprofen  (ADVIL ) 800 MG tablet Take 1 tablet (800 mg total) by mouth 3 (three) times daily. 03/03/24   Dreama, [place]  N, FNP  PEG 3350 -KCl-NaCl-NaSulf-MgSul (SUFLAVE ) 178.7 g SOLR Take 1 kit by mouth as directed. 07/10/23   Pyrtle, Gordy HERO, MD  promethazine -dextromethorphan (PROMETHAZINE -DM) 6.25-15 MG/5ML syrup Take 5 mLs by mouth 4 (four) times daily as needed for cough. Patient not taking: Reported on 07/10/2023 06/16/23   Hermanns, Ashlee P, PA-C  metoprolol  tartrate (LOPRESSOR ) 100 MG tablet Take one tablet by mouth two hours prior to CT 03/12/20 04/22/20  Delford Maude BROCKS, MD    Family History Family History  Problem Relation Age of Onset   Lung cancer Father  Asthma Brother    Stroke Brother    Diabetes Maternal Grandfather    Diabetes Maternal Uncle    Hyperlipidemia Maternal Grandmother    Colon cancer Paternal Uncle    Prostate cancer Paternal Uncle    Stomach cancer Neg Hx    Esophageal cancer Neg Hx    Colon polyps Neg Hx    Social History Social History   Tobacco Use   Smoking status: Some Days    Types: Cigars   Smokeless tobacco: Never  Vaping Use   Vaping status: Never Used  Substance Use Topics   Alcohol use:  Yes    Comment: weekends   Drug use: No   Allergies   Statins  Review of Systems Review of Systems  Respiratory:  Positive for cough.    Pertinent findings revealed after performing a 14 point review of systems has been noted in the history of present illness.  Physical Exam Vital Signs BP (!) 142/91   Pulse 74   Resp 16   SpO2 95%   No data found.  Physical Exam Vitals and nursing note reviewed.  Constitutional:      General: He is awake. He is not in acute distress.    Appearance: Normal appearance. He is well-developed and well-groomed. He is not ill-appearing.  HENT:     Head: Normocephalic and atraumatic.     Salivary Glands: Right salivary gland is not diffusely enlarged or tender. Left salivary gland is not diffusely enlarged or tender.     Right Ear: Hearing, ear canal and external ear normal. There is impacted cerumen.     Left Ear: Hearing, ear canal and external ear normal. A middle ear effusion (Clear fluid) is present. Tympanic membrane is bulging. Tympanic membrane is not injected or erythematous.     Ears:     Comments: lear fluid    Nose: Rhinorrhea present. No nasal deformity, septal deviation, signs of injury or nasal tenderness. Rhinorrhea is clear.     Right Nostril: Occlusion present. No foreign body, epistaxis or septal hematoma.     Left Nostril: Occlusion present. No foreign body, epistaxis or septal hematoma.     Right Turbinates: Enlarged, swollen and pale.     Left Turbinates: Enlarged, swollen and pale.     Right Sinus: No maxillary sinus tenderness or frontal sinus tenderness.     Left Sinus: No maxillary sinus tenderness or frontal sinus tenderness.     Mouth/Throat:     Lips: Pink. No lesions.     Mouth: Mucous membranes are moist. No oral lesions.     Tongue: No lesions. Tongue does not deviate from midline.     Palate: No mass and lesions.     Pharynx: Oropharynx is clear. Uvula midline. Postnasal drip present. No pharyngeal swelling,  oropharyngeal exudate, posterior oropharyngeal erythema or uvula swelling.     Tonsils: No tonsillar exudate. 0 on the right. 0 on the left.     Comments: Postnasal drip Eyes:     General: Lids are normal.        Right eye: No discharge.        Left eye: No discharge.     Conjunctiva/sclera: Conjunctivae normal.     Right eye: Right conjunctiva is not injected.     Left eye: Left conjunctiva is not injected.  Neck:     Trachea: Trachea and phonation normal.  Cardiovascular:     Rate and Rhythm: Normal rate and regular rhythm.  Pulmonary:  Effort: Pulmonary effort is normal.     Breath sounds: Normal breath sounds.  Chest:     Chest wall: No tenderness.  Musculoskeletal:        General: Normal range of motion.     Cervical back: Full passive range of motion without pain, normal range of motion and neck supple. Normal range of motion.  Lymphadenopathy:     Cervical: No cervical adenopathy.  Skin:    General: Skin is warm and dry.     Findings: No erythema or rash.  Neurological:     General: No focal deficit present.     Mental Status: He is alert and oriented to person, place, and time. Mental status is at baseline.  Psychiatric:        Attention and Perception: Attention and perception normal.        Mood and Affect: Mood and affect normal.        Speech: Speech normal.        Behavior: Behavior normal. Behavior is cooperative.        Thought Content: Thought content normal.     Visual Acuity Right Eye Distance:   Left Eye Distance:   Bilateral Distance:    Right Eye Near:   Left Eye Near:    Bilateral Near:     UC Couse / Diagnostics / Procedures:     Radiology No results found.  Procedures Procedures (including critical care time) EKG  Pending results:  Labs Reviewed - No data to display  Medications Ordered in UC: Medications - No data to display  UC Diagnoses / Final Clinical Impressions(s)   I have reviewed the triage vital signs and the  nursing notes.  Pertinent labs & imaging results that were available during my care of the patient were reviewed by me and considered in my medical decision making (see chart for details).    Final diagnoses:  Acute rhinitis  Post-nasal drip  Subacute cough   Patient advised physical exam findings concerning for upper respiratory allergies causing postnasal drip and triggering cough.  Lungs are clear to auscultation, there is no concern for wheezing or bronchitis.  Recommend resume antihistamine and nasal steroid spray, Atrovent  nasal spray added to help provide relief of symptoms a little faster.  Conservative care recommended.  Return precautions advised.  Please see discharge instructions below for details of plan of care as provided to patient. ED Prescriptions     Medication Sig Dispense Auth. Provider   levocetirizine (XYZAL ) 5 MG tablet Take 1 tablet (5 mg total) by mouth every evening. 30 tablet Joesph Shaver Scales, PA-C   ipratropium (ATROVENT ) 0.06 % nasal spray Place 2 sprays into both nostrils 3 (three) times daily. As needed for nasal congestion, runny nose 15 mL Joesph Shaver Scales, PA-C   fluticasone  (FLONASE ) 50 MCG/ACT nasal spray Place 1 spray into both nostrils daily. Begin by using 2 sprays in each nare daily for 3 to 5 days, then decrease to 1 spray in each nare daily. 15.8 mL Joesph Shaver Scales, PA-C      PDMP not reviewed this encounter.    Discharge Instructions      Your symptoms and my physical exam findings are concerning for exacerbation of your underlying allergies.                   Please read below to learn more about the medications, dosages and frequencies that I recommend to help alleviate your symptoms and to get you feeling  better soon:                      Xyzal  (levocetirizine): This is an excellent second-generation antihistamine that helps to reduce respiratory inflammatory response to environmental allergens.  In some patients, this  medication can cause daytime sleepiness so I recommend that you take 1 tablet daily at bedtime.                                 Flonase  (fluticasone ): This is a steroid nasal spray that used once daily, 1 spray in each nare.  This works best when used on a daily basis. This medication does not work well if it is only used when you think you need it.  After 3 to 5 days of use, you will notice significant reduction of the inflammation and mucus production that is currently being caused by exposure to allergens, whether seasonal or environmental.  The most common side effect of this medication is nosebleeds.  If you experience a nosebleed, please discontinue use for 1 week, then feel free to resume.  If you find that your insurance will not pay for this medication, please consider a different nasal steroids such as Nasonex (mometasone), or Nasacort  (triamcinolone ).       Atrovent  (ipratropium): This is an excellent nasal decongestant spray that will not cause rebound congestion.  Please instill 2 sprays into each nare after using the nasal steroid and repeat 2 more times throughout the day.  I have added this nasal spray to the nasal steroid because nasal steroids can take several days to reach full effect.  Once you find that you are forgetting to use the spray more often than you remember to use it, you will know that you no longer need it.           If symptoms have not meaningfully improved in the next 5 to 7 days, please return for repeat evaluation or follow-up with your regular provider.  If symptoms have worsened in the next 3 to 5 days, please return for repeat evaluation or follow-up with your regular provider.        Thank you for visiting [facility] today.  We appreciate the opportunity to participate in your care.       Disposition Upon Discharge:  Condition: stable for discharge home  Patient presented with an acute illness with associated systemic symptoms and significant  discomfort requiring urgent management. In my opinion, this is a condition that a prudent lay person (someone who possesses an average knowledge of health and medicine) may potentially expect to result in complications if not addressed urgently such as respiratory distress, impairment of bodily function or dysfunction of bodily organs.   Routine symptom specific, illness specific and/or disease specific instructions were discussed with the patient and/or caregiver at length.   As such, the patient has been evaluated and assessed, work-up was performed and treatment was provided in alignment with urgent care protocols and evidence based medicine.  Patient/parent/caregiver has been advised that the patient may require follow up for further testing and treatment if the symptoms continue in spite of treatment, as clinically indicated and appropriate.  Patient/parent/caregiver has been advised to return to the [facility] or PCP if no better; to PCP or the Emergency Department if new signs and symptoms develop, or if the current signs or symptoms continue to change or worsen for further workup,  evaluation and treatment as clinically indicated and appropriate  The patient will follow up with their current PCP if and as advised. If the patient does not currently have a PCP we will assist them in obtaining one.   The patient may need specialty follow up if the symptoms continue, in spite of conservative treatment and management, for further workup, evaluation, consultation and treatment as clinically indicated and appropriate.  Patient/parent/caregiver verbalized understanding and agreement of plan as discussed.  All questions were addressed during visit.  Please see discharge instructions below for further details of plan.  This office note has been dictated using Teaching laboratory technician.  Unfortunately, this method of dictation can sometimes lead to typographical or grammatical errors.  I apologize for  your inconvenience in advance if this occurs.  Please do not hesitate to reach out to me if clarification is needed.      Joesph Shaver Scales, PA-C 05/12/24 [phone]

## 2024-05-23 ENCOUNTER — Encounter: Payer: Self-pay | Admitting: Family Medicine

## 2024-05-23 ENCOUNTER — Other Ambulatory Visit (HOSPITAL_BASED_OUTPATIENT_CLINIC_OR_DEPARTMENT_OTHER): Payer: Self-pay

## 2024-05-23 ENCOUNTER — Ambulatory Visit: Payer: 59 | Admitting: Family Medicine

## 2024-05-23 VITALS — BP 128/78 | HR 79 | Temp 97.6°F | Ht 73.0 in | Wt 219.6 lb

## 2024-05-23 DIAGNOSIS — M791 Myalgia, unspecified site: Secondary | ICD-10-CM

## 2024-05-23 DIAGNOSIS — E782 Mixed hyperlipidemia: Secondary | ICD-10-CM

## 2024-05-23 DIAGNOSIS — Z125 Encounter for screening for malignant neoplasm of prostate: Secondary | ICD-10-CM | POA: Diagnosis not present

## 2024-05-23 DIAGNOSIS — E663 Overweight: Secondary | ICD-10-CM | POA: Diagnosis not present

## 2024-05-23 DIAGNOSIS — Z0000 Encounter for general adult medical examination without abnormal findings: Secondary | ICD-10-CM

## 2024-05-23 DIAGNOSIS — R7309 Other abnormal glucose: Secondary | ICD-10-CM

## 2024-05-23 DIAGNOSIS — Z23 Encounter for immunization: Secondary | ICD-10-CM | POA: Diagnosis not present

## 2024-05-23 DIAGNOSIS — I10 Essential (primary) hypertension: Secondary | ICD-10-CM | POA: Diagnosis not present

## 2024-05-23 DIAGNOSIS — T466X5A Adverse effect of antihyperlipidemic and antiarteriosclerotic drugs, initial encounter: Secondary | ICD-10-CM | POA: Insufficient documentation

## 2024-05-23 LAB — LIPID PANEL
Cholesterol: 247 mg/dL — ABNORMAL HIGH (ref 28–200)
HDL: 51 mg/dL (ref >39.00)
LDL Cholesterol: 176 mg/dL — ABNORMAL HIGH (ref 10–99)
NonHDL: 196.03
Total CHOL/HDL Ratio: 5
Triglycerides: 102 mg/dL (ref 10.0–149.0)
VLDL: 20.4 mg/dL (ref 0.0–40.0)

## 2024-05-23 LAB — COMPREHENSIVE METABOLIC PANEL WITH GFR
ALT: 42 U/L (ref 3–53)
AST: 52 U/L — ABNORMAL HIGH (ref 5–37)
Albumin: 4.6 g/dL (ref 3.5–5.2)
Alkaline Phosphatase: 48 U/L (ref 39–117)
BUN: 11 mg/dL (ref 6–23)
CO2: 28 meq/L (ref 19–32)
Calcium: 9.4 mg/dL (ref 8.4–10.5)
Chloride: 105 meq/L (ref 96–112)
Creatinine, Ser: 1.04 mg/dL (ref 0.40–1.50)
GFR: 82.77 mL/min (ref >60.00)
Glucose, Bld: 87 mg/dL (ref 70–99)
Potassium: 4.1 meq/L (ref 3.5–5.1)
Sodium: 140 meq/L (ref 135–145)
Total Bilirubin: 0.8 mg/dL (ref 0.2–1.2)
Total Protein: 7.1 g/dL (ref 6.0–8.3)

## 2024-05-23 LAB — CBC
HCT: 44.4 % (ref 39.0–52.0)
Hemoglobin: 14.5 g/dL (ref 13.0–17.0)
MCHC: 32.6 g/dL (ref 30.0–36.0)
MCV: 84.6 fl (ref 78.0–100.0)
Platelets: 207 K/uL (ref 150.0–400.0)
RBC: 5.25 Mil/uL (ref 4.22–5.81)
RDW: 13.6 % (ref 11.5–15.5)
WBC: 3.6 K/uL — ABNORMAL LOW (ref 4.0–10.5)

## 2024-05-23 LAB — TSH: TSH: 2.55 u[IU]/mL (ref 0.35–5.50)

## 2024-05-23 LAB — HEMOGLOBIN A1C: Hgb A1c MFr Bld: 6.1 % (ref 4.6–6.5)

## 2024-05-23 LAB — PSA: PSA: 0.34 ng/mL (ref 0.10–4.00)

## 2024-05-23 MED ORDER — REPATHA SURECLICK 140 MG/ML ~~LOC~~ SOAJ
140.0000 mg | SUBCUTANEOUS | 3 refills | Status: AC
  Filled 2024-05-23: qty 6, 84d supply, fill #0

## 2024-05-23 MED ORDER — AMLODIPINE BESYLATE 2.5 MG PO TABS
2.5000 mg | ORAL_TABLET | Freq: Every day | ORAL | 1 refills | Status: AC
  Filled 2024-05-23: qty 90, 90d supply, fill #0

## 2024-05-23 NOTE — Progress Notes (Signed)
 Patient ID: Russell Ortiz, male  DOB: November 25, 1970, 53 y.o.   MRN: [id number] Patient Care Team    Relationship Specialty Notifications Start End  Catherine Charlies LABOR, DO PCP - General Family Medicine  08/16/22   Pyrtle, Gordy HERO, MD Consulting Physician Gastroenterology  04/22/19   Tobie Tonita POUR, DO Consulting Physician Neurology  03/13/20   Delford Maude BROCKS, MD Consulting Physician Cardiology  05/23/24   Matilda Senior, MD Consulting Physician Urology  05/23/24     Chief Complaint  Patient presents with   Annual Exam    Pt is fasting Influenza vaccine-given Prevnar vaccine-given Discussed hepatitis B vaccine    Subjective: Russell Ortiz is a 53 y.o. male present for CPE and combined chronic condition management All past medical history, surgical history, allergies, family history, immunizations, medications and social history were updated in the electronic medical record today. All recent labs, ED visits and hospitalizations within the last year were reviewed.  Health maintenance:  Colonoscopy: Completed 07/31/2023- Dr. Albertus, 10-year follow-up.   Immunizations:  tdap UTD 04/2018, influenza-completed today, shingrix  series completed, Prevnar-completed today, heplisav #1 today Infectious disease screening: HIV and hep c screen completed PSA: no fhx.  Lab Results  Component Value Date   PSA 0.30 05/23/2023   PSA 0.29 05/18/2022   PSA 0.25 05/17/2021  , pt was counseled on prostate cancer screenings.  Patient has a Dental home. Hospitalizations/ED visits: reviewed  Hypertension/hyperlipidemia/E66.3: Pt reports compliance with amlodipine  2.5 mg daily.  Reviewed all visits in EMR at other locations over the last year and blood pressure has been elevated on every visit.   Patient denies chest pain, shortness of breath or lower extremity edema.  Pt does not take a daily baby ASA. Pt is intolerant to statin due to myalgias.  RF: Hypertension, hyperlipidemia, BMI greater than 25,  smoker, prediabetic, family history of stroke in brother.   He has changed his diet- eating fresh or frozen veggies and lean meats. Taking beet juice.      05/23/2024    8:15 AM 08/17/2022    9:14 AM 05/18/2022    8:06 AM 05/17/2021    9:56 AM 05/04/2021    3:52 PM  Depression screen PHQ 2/9  Decreased Interest 0 0 0 0 0  Down, Depressed, Hopeless 0 0 0 0 0  PHQ - 2 Score 0 0 0 0 0       No data to display                05/23/2024    8:15 AM 06/15/2020   10:00 AM 03/13/2020   11:13 AM 09/14/2017    8:26 AM 03/17/2016    8:51 AM  Fall Risk   Falls in the past year? 0 0 0 No  No   Number falls in past yr: 0 0 0    Injury with Fall? 0 0  0     Risk for fall due to : No Fall Risks      Follow up Falls evaluation completed         Data saved with a previous flowsheet row definition    Immunization History  Administered Date(s) Administered   Dtap, Unspecified 06/26/1972, 08/21/1972, 10/23/1972, 10/12/1973, 10/04/1976   Hepb-cpg 05/23/2024   Influenza Split 03/29/2012   Influenza, Seasonal, Injecte, Preservative Fre 05/23/2023, 05/23/2024   Influenza,inj,Quad PF,6+ Mos 03/17/2016, 04/13/2018, 04/22/2019, 04/22/2020, 05/17/2021, 05/18/2022   Influenza-Unspecified 04/22/2020   Measles 04/02/1973   Mumps 02/08/1974   PFIZER(Purple  Top)SARS-COV-2 Vaccination 09/07/2019, 10/02/2019, 05/06/2020   PNEUMOCOCCAL CONJUGATE-20 05/23/2024   Polio, Unspecified 06/26/1972, 08/21/1972, 10/23/1972, 10/12/1973, 10/04/1976   Rubella 04/02/1973   Td 08/15/2007   Tdap 04/13/2018   Zoster Recombinant(Shingrix ) 05/18/2022, 08/17/2022   Past Medical History:  Diagnosis Date   Genital warts    Herpes zoster without complication 05/04/2021   HTN (hypertension)    Hyperlipidemia    Mass of left thigh 04/06/2018   PUD (peptic ulcer disease)    Allergies  Allergen Reactions   Statins Other (See Comments)    mylagia   Past Surgical History:  Procedure Laterality Date   VASECTOMY   2010   Family History  Problem Relation Age of Onset   Lung cancer Father    Asthma Brother    Stroke Brother    Hyperlipidemia Maternal Grandmother    Diabetes Maternal Grandfather    Diabetes Maternal Uncle    Colon cancer Paternal Uncle    Prostate cancer Paternal Uncle    Stomach cancer Neg Hx    Esophageal cancer Neg Hx    Colon polyps Neg Hx    Social History   Social History Narrative   Married. 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    High school grad.    Wears seat belt. Exercises 3x or greater a week.    Former Smoker. Quit 1999, Occasional ETOH, no recreational drugs.    Drinks caffeine beverages. Uses herbal remedies. Takes a multivitamin.    Smoker alarm in the home.    Feels safe in his relationships.    Right Handed   Lives in one story home       Allergies as of 05/23/2024       Reactions   Statins Other (See Comments)   mylagia        Medication List        Accurate as of May 23, 2024  8:36 AM. If you have any questions, ask your nurse or doctor.          STOP taking these medications    ibuprofen  800 MG tablet Commonly known as: ADVIL  Stopped by: Charlies Bellini, DO       TAKE these medications    amLODipine  2.5 MG tablet Commonly known as: NORVASC  Take 1 tablet (2.5 mg total) by mouth daily.   fluticasone  50 MCG/ACT nasal spray Commonly known as: FLONASE  Place 1 spray into both nostrils daily. Begin by using 2 sprays in each nare daily for 3 to 5 days, then decrease to 1 spray in each nare daily.   ipratropium 0.06 % nasal spray Commonly known as: ATROVENT  Place 2 sprays into both nostrils 3 (three) times daily. As needed for nasal congestion, runny nose   levocetirizine 5 MG tablet Commonly known as: XYZAL  Take 1 tablet (5 mg total) by mouth every evening.   MULTIVITAMIN ADULT PO Take by mouth daily.   Repatha  SureClick 140 MG/ML Soaj Generic drug: Evolocumab  Inject 140 mg into the skin  every 14 (fourteen) days.       All past medical history, surgical history, allergies, family history, immunizations andmedications were updated in the EMR today and reviewed under the history and medication portions of their EMR.     No results found.  Review of Systems  Constitutional: Negative.   HENT: Negative.    Eyes: Negative.   Respiratory: Negative.    Cardiovascular: Negative.   Gastrointestinal: Negative.   Genitourinary: Negative.   Musculoskeletal: Negative.   Skin:  Negative.   Neurological: Negative.   Endo/Heme/Allergies: Negative.   Psychiatric/Behavioral: Negative.    All other systems reviewed and are negative.  14 pt review of systems performed and negative (unless mentioned in an HPI)  Objective: BP 128/78   Pulse 79   Temp 97.6 F (36.4 C)   Ht 6' 1 (1.854 m)   Wt 219 lb 9.6 oz (99.6 kg)   SpO2 98%   BMI 28.97 kg/m  Physical Exam Constitutional:      General: He is not in acute distress.    Appearance: Normal appearance. He is not ill-appearing, toxic-appearing or diaphoretic.  HENT:     Head: Normocephalic and atraumatic.     Right Ear: Tympanic membrane, ear canal and external ear normal. There is no impacted cerumen.     Left Ear: Tympanic membrane, ear canal and external ear normal. There is no impacted cerumen.     Nose: Nose normal. No congestion or rhinorrhea.     Mouth/Throat:     Mouth: Mucous membranes are moist.     Pharynx: Oropharynx is clear. No oropharyngeal exudate or posterior oropharyngeal erythema.  Eyes:     General: No scleral icterus.       Right eye: No discharge.        Left eye: No discharge.     Extraocular Movements: Extraocular movements intact.     Pupils: Pupils are equal, round, and reactive to light.  Cardiovascular:     Rate and Rhythm: Normal rate and regular rhythm.     Pulses: Normal pulses.     Heart sounds: Normal heart sounds. No murmur heard.    No friction rub. No gallop.  Pulmonary:     Effort:  Pulmonary effort is normal. No respiratory distress.     Breath sounds: Normal breath sounds. No stridor. No wheezing, rhonchi or rales.  Chest:     Chest wall: No tenderness.  Abdominal:     General: Abdomen is flat. Bowel sounds are normal. There is no distension.     Palpations: Abdomen is soft. There is no mass.     Tenderness: There is no abdominal tenderness. There is no right CVA tenderness, left CVA tenderness, guarding or rebound.     Hernia: No hernia is present.  Musculoskeletal:        General: No swelling or tenderness. Normal range of motion.     Cervical back: Normal range of motion and neck supple.     Right lower leg: No edema.     Left lower leg: No edema.  Lymphadenopathy:     Cervical: No cervical adenopathy.  Skin:    General: Skin is warm and dry.     Coloration: Skin is not jaundiced.     Findings: No bruising, lesion or rash.  Neurological:     General: No focal deficit present.     Mental Status: He is alert and oriented to person, place, and time. Mental status is at baseline.     Cranial Nerves: No cranial nerve deficit.     Sensory: No sensory deficit.     Motor: No weakness.     Coordination: Coordination normal.     Gait: Gait normal.     Deep Tendon Reflexes: Reflexes normal.  Psychiatric:        Mood and Affect: Mood normal.        Behavior: Behavior normal.        Thought Content: Thought content normal.  Judgment: Judgment normal.    No results found.  Assessment/plan: Russell Ortiz is a 53 y.o. male present for CPE and chronic condition management HTN/Hyperlipidemia, unspecified hyperlipidemia type/Overweight (BMI 25.0-29.9) improving Goal blood pressure<130/80 - Statin myalgia Continue repatha  q 14 d injection.  Continue Amlodipine  2.5 mg every day Labs collected today Discussed CAC> ordered for dwg  Elevated hemoglobin A1c A1c collected today Routine exercise and dietary modifications recommended Prostate cancer  screening PSA collected today  Need for vaccination for pneumococcus - Pneumococcal conjugate vaccine 20-valent Influenza vaccine needed - Flu vaccine trivalent PF, 6mos and older(Flulaval,Afluria,Fluarix,Fluzone) Need for hepatitis B vaccination - Heplisav-B  (HepB-CPG) Vaccine Nurse visit 5-10 weeks for heplisav #2  Elevated hemoglobin A1c - Hemoglobin A1c Prostate cancer screening - PSA   Routine general medical examination at a health care facility Colonoscopy: Completed 07/31/2023- Dr. Albertus, 10-year follow-up.   Immunizations:  tdap UTD 04/2018, influenza-completed today, shingrix  series completed, Prevnar-completed today, heplisav #1 today Infectious disease screening: HIV and hep c screen completed PSA: no fhx.  Patient was encouraged to exercise greater than 150 minutes a week. Patient was encouraged to choose a diet filled with fresh fruits and vegetables, and lean meats. AVS provided to patient today for education/recommendation on gender specific health and safety maintenance.   Return in about 24 weeks (around 11/07/2024) for Routine chronic condition follow-up.   Orders Placed This Encounter  Procedures   CT CARDIAC SCORING (SELF PAY ONLY)   Flu vaccine trivalent PF, 6mos and older(Flulaval,Afluria,Fluarix,Fluzone)   Pneumococcal conjugate vaccine 20-valent   Heplisav-B  (HepB-CPG) Vaccine   CBC   Comprehensive metabolic panel with GFR   Hemoglobin A1c   Lipid panel   TSH   PSA   Meds ordered this encounter  Medications   amLODipine  (NORVASC ) 2.5 MG tablet    Sig: Take 1 tablet (2.5 mg total) by mouth daily.    Dispense:  90 tablet    Refill:  1   Evolocumab  (REPATHA  SURECLICK) 140 MG/ML SOAJ    Sig: Inject 140 mg into the skin every 14 (fourteen) days.    Dispense:  6 mL    Refill:  3    Referral Orders  No referral(s) requested today     Note is dictated utilizing voice recognition software. Although note has been proof read prior to signing,  occasional typographical errors still can be missed. If any questions arise, please do not hesitate to call for verification.  Electronically signed by: Charlies Bellini, DO [facility] [place]

## 2024-05-23 NOTE — Patient Instructions (Addendum)
 Return in about 24 weeks (around 11/07/2024) for Routine chronic condition follow-up. 5-10 weeks nurse visit for Hep B #2       Great to see you today.  I have refilled the medication(s) we provide.   If labs were collected or images ordered, we will inform you of  results once we have received them and reviewed. We will contact you either by echart message, or telephone call.  Please give ample time to the testing facility, and our office to run,  receive and review results. Please do not call inquiring of results, even if you can see them in your chart. We will contact you as soon as we are able. If it has been over 1 week since the test was completed, and you have not yet heard from us , then please call us .    - echart message- for normal results that have been seen by the patient already.   - telephone call: abnormal results or if patient has not viewed results in their echart.  If a referral to a specialist was entered for you, please call us  in 2 weeks if you have not heard from the specialist office to schedule.

## 2024-05-24 ENCOUNTER — Ambulatory Visit: Payer: Self-pay | Admitting: Family Medicine

## 2024-05-24 ENCOUNTER — Telehealth: Payer: Self-pay | Admitting: Family Medicine

## 2024-05-24 NOTE — Telephone Encounter (Signed)
Placed on PCP desk to review and sign, if appropriate.  

## 2024-05-24 NOTE — Telephone Encounter (Signed)
 Spoke with Russell Ortiz at [facility], she was informed we did not receive any forms for the patient and will resend them. Pt was notified by phone that forms are being sent again

## 2024-05-24 NOTE — Telephone Encounter (Signed)
 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

## 2024-05-24 NOTE — Telephone Encounter (Addendum)
 Spoke with pt and advised faxes were being reviewed. We would call and provide an update if we received his forms, he voiced understanding.  The patient is calling back to see if we could get the form that was faxed from [facility] signed and faxed back as soon as possible. 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   [facility]: [phone]   Please call the patient as he is aware of the same-day turn around time and is eagerly waiting for an update: [phone]

## 2024-05-27 DIAGNOSIS — Z0279 Encounter for issue of other medical certificate: Secondary | ICD-10-CM

## 2024-05-27 NOTE — Telephone Encounter (Signed)
 Physician portion completed.  Placed in CMA work runner, broadcasting/film/video provider completion and faxing

## 2024-05-27 NOTE — Telephone Encounter (Signed)
 Form faxed. Pt aware and verbalized understanding.

## 2024-05-31 ENCOUNTER — Other Ambulatory Visit (HOSPITAL_BASED_OUTPATIENT_CLINIC_OR_DEPARTMENT_OTHER): Payer: Self-pay

## 2024-05-31 ENCOUNTER — Ambulatory Visit (HOSPITAL_BASED_OUTPATIENT_CLINIC_OR_DEPARTMENT_OTHER)
Admission: RE | Admit: 2024-05-31 | Discharge: 2024-05-31 | Disposition: A | Discharge status: Home / Self Care | Payer: Self-pay | Source: Ambulatory Visit | Attending: Family Medicine | Admitting: Family Medicine

## 2024-05-31 DIAGNOSIS — I10 Essential (primary) hypertension: Secondary | ICD-10-CM

## 2024-05-31 DIAGNOSIS — E663 Overweight: Secondary | ICD-10-CM

## 2024-05-31 DIAGNOSIS — E782 Mixed hyperlipidemia: Secondary | ICD-10-CM

## 2024-06-26 ENCOUNTER — Encounter: Payer: Self-pay | Admitting: Family Medicine

## 2024-06-27 ENCOUNTER — Ambulatory Visit

## 2025-05-26 ENCOUNTER — Encounter: Admitting: Family Medicine
